# Patient Record
Sex: Female | Born: 1955 | Race: White | Hispanic: No | Smoking: Former smoker
Health system: Southern US, Community
[De-identification: ages and names within clinical notes are randomized; demographics above are authoritative.]

## PROBLEM LIST (undated history)

## (undated) DIAGNOSIS — I82419 Acute embolism and thrombosis of unspecified femoral vein: Secondary | ICD-10-CM

## (undated) DIAGNOSIS — I639 Cerebral infarction, unspecified: Secondary | ICD-10-CM

## (undated) DIAGNOSIS — C801 Malignant (primary) neoplasm, unspecified: Secondary | ICD-10-CM

## (undated) DIAGNOSIS — E785 Hyperlipidemia, unspecified: Secondary | ICD-10-CM

## (undated) DIAGNOSIS — R202 Paresthesia of skin: Secondary | ICD-10-CM

## (undated) DIAGNOSIS — E119 Type 2 diabetes mellitus without complications: Secondary | ICD-10-CM

## (undated) DIAGNOSIS — R7303 Prediabetes: Secondary | ICD-10-CM

## (undated) HISTORY — DX: Hyperlipidemia, unspecified: E78.5

## (undated) HISTORY — PX: BLADDER SURGERY: SHX569

## (undated) HISTORY — PX: ABDOMINAL HYSTERECTOMY: SHX81

## (undated) HISTORY — DX: Prediabetes: R73.03

## (undated) HISTORY — DX: Malignant (primary) neoplasm, unspecified: C80.1

## (undated) HISTORY — PX: DILATION AND EVACUATION: SHX1459

## (undated) HISTORY — DX: Cerebral infarction, unspecified: I63.9

## (undated) HISTORY — DX: Paresthesia of skin: R20.2

---

## 2010-09-27 ENCOUNTER — Ambulatory Visit: Payer: Self-pay | Admitting: Urology

## 2010-10-04 ENCOUNTER — Ambulatory Visit: Payer: Self-pay | Admitting: Urology

## 2010-10-06 LAB — PATHOLOGY REPORT

## 2013-07-29 ENCOUNTER — Ambulatory Visit (INDEPENDENT_AMBULATORY_CARE_PROVIDER_SITE_OTHER): Payer: BC Managed Care – PPO | Admitting: Psychology

## 2013-07-29 DIAGNOSIS — F909 Attention-deficit hyperactivity disorder, unspecified type: Secondary | ICD-10-CM

## 2013-07-29 DIAGNOSIS — F908 Attention-deficit hyperactivity disorder, other type: Secondary | ICD-10-CM

## 2013-08-17 ENCOUNTER — Encounter (HOSPITAL_COMMUNITY): Payer: Self-pay | Admitting: Psychology

## 2013-08-17 ENCOUNTER — Ambulatory Visit (INDEPENDENT_AMBULATORY_CARE_PROVIDER_SITE_OTHER): Payer: BC Managed Care – PPO | Admitting: Psychology

## 2013-08-17 DIAGNOSIS — F909 Attention-deficit hyperactivity disorder, unspecified type: Secondary | ICD-10-CM

## 2013-08-17 DIAGNOSIS — F908 Attention-deficit hyperactivity disorder, other type: Secondary | ICD-10-CM

## 2013-08-17 NOTE — Progress Notes (Signed)
The patient was administered the Comprehensive Attention Battery and the CAB CPT measures. The patient appeared to fully participate in these testing procedures and this does appear to be a fair and valid sample of her current attentional abilities as well as various aspects of executive functioning. Below are the results of this broad and comprehensive assessment of attention/concentration and executive functioning.  Initially, the patient was administered the auditory/visual reaction time test. These two measures are both pure reaction time measures and are administered in both the visual and auditory modalities. On the visual pure reaction time test, the patient accurately responded to 50 of the 50 targets, which is within normal limits. her average response time was within normal limits. The patient was administered the auditory pure reaction time test and she correctly responded to 49 of 50 targets, which is an efficient performance and within normal limits. her average response time was within normal limits.  The patient was then administered the discriminant reaction time test. she was administered the visual, auditory, and mixed subtests. On the visual discriminate reaction time measure, she correctly responded to 35 of 35 targets and had 0 errors of commission and 0 errors of omission. This is an efficient performance and represents a performance that is well within normative expectations. her average response time for correctly responded to items was 617 ms which is within normal limits. The patient was then administered the auditory discriminate reaction time measure. she correctly responded to 35 of 35 targets, which is efficient and within normal limits. her average response time was 701 ms, which is within normative expectations. The patient was then administered the mixed discriminate reaction time, which require shifting from between either auditory or visual targets with an alteration between  auditory and visual stimuli. This measure require shifting attention on top of discriminate identification and responding.  The patient correctly responded to 28 of the 30 targets and had one errors of commission and 2 errors of omission. This is an efficient score for accuracy.  her average response time for correct responses was 806 ms.  This performance is also within  normal limits and represents good ability to maintain set and shift between cognitive stimuli .  The patient was administered the auditory/visual scan reaction time test. On the visual measure the patient correctly responded to  to 38 of 40 targets and the average response time was within normal limits. The auditory measure resulted in the correct response to  37 of 40 targets with1  errors of commission and 2 error of omission. her average response times  were within normal limits. The patient was then administered the mixed auditory visual scan measure and she correctly responded to  to 39 of 40 targets, which is  within normal limits and her response times were  within normal limits.  The patient was then administered the auditory/visual encoding test. On the auditory forwards the patient's performance was within normal limits.  On the auditory backwards measures the patient's performance was within normal limits.  This pattern suggests  efficient performance with regard to auditory encoding. On the visual encoding forward measure the patient produced performance that was above normal limits.  On the visual backwards measures the patient's performance was above normal limits.  Overall, this pattern suggests that auditory encoding is within  normal limits and visual encoding is  in particular strength for her and equal to or above normative expectations.  The patient was then administered the Stroop interference cancellation test. This task is  broken down into eight separate trials. On the first four trials the patient is presented with a  focus execute task that requires the patient to scan a 36 grid layout in which the words red green or blue were randomly printed in each grid. Each of these color words and be printed in either red green or blue color. On half of them, the word matches the color of the font and it is these that the patient is to identify where the color and word match. After the first four trials of this visual scanning measure change to four trials that include a Stroop interference component inwhich the words red green and blue are played randomly over the speakers. On the first four "noninterference" trials the patient produced performances on these focus execute task that were  within normal limits. she correctly identified between  9 and 12 items on each of these trials. On the next four interference trials, the patient's performance showed  some difficulties with target distractors. The patient  clearly had significant interference and displayed difficulty handling the Stroop interference measures. her performance under these interference measures suggest  increased distractibility and difficulty in situations that may be noisy in nature or have other visual or auditory/.  The patient was then administered the CAB CPT visual monitor measure, which is a 15 minute long visual continuous performance measure.  This measure is broken down into five 3-minute blocks of time for analysis. The patient is presented with either the color red green or blue every 2 seconds and every time the color red is presented the patient is to respond. On the first 3 min. Block of time the patient correctly identified  30  of 30 targets with 0  error of commission and  0  errors of omission. her average response time was  540  ms. This performance  clearly deteriorated  over the next four blocks of time.  Average response time increase and consistently and progressively worsened. By the last 3 min. of this measure average response time was  724    ms, which is  a significant  increase over the very first 3 min. of this task. The results of this continues performance measure are  are clearly consistent  consistent with any deficits with regard to sustained attention and concentration.  Overall:  The patient's performance on this broad range of attention/concentration measures and executive functioning measures are  generally  consistent with those typically found with attention deficit disorder and not typical of individuals dealing with others psychological/psychiatric issues such as depression or anxiety. While it is possible that chronic sleep deprivation may be playing a role, it does appear that the patient's reported difficulties are persistent and consistent both on days where she has slept well before versus days where she has had poor sleep. While the patient may be doing even worse on days where she has poor sleep these patterns of attentional difficulties are consistent even on days where she has slept well. The two most significant variables that point attention deficit disorder versus other neurocognitive deficits are the fact that the patient does quite well on simple focus execute task, very good on general encoding task both auditory and visually and good ability to shift between targets and cognitive stimuli. However, the patient showed great difficulty with external distraction when it was targeted in nature (Stroop Effect) as well as showing a significant and precipitous decline in cognitive function when placed in a sustained attention challenge.  I do think the results of these formal and objective neuropsychological measures suggest the diagnosis of adult residual attention deficit disorder and the patient's pattern of strengths and weaknesses on neuropsychological measures suggest that she very well may be a good responder to typical psychostimulant medications. As far as specific recommendations from the range of psychostimulant  available, I would specifically and directly questioned the patient about variations during the day and how difficult it is in the morning versus the afternoon. If most of her difficulties are in the afternoon, I would suggest considering a medication like Concerta which, gives more of the psychostimulant four hours after taking. If these difficulties are consistent throughout the day, I would consider either a sustained-release Adderall or Metadate. This determination should be made by her physician as other medical issues may need to be taken into account relative to the use of psychostimulant medications.

## 2013-08-17 NOTE — Progress Notes (Signed)
Patient:   Natalie Frey   DOB:   1956/03/17  MR Number:  [id number]  Location:  [facility] [address] [place] [address] Dept: [phone]           Date of Service:   07/29/2013  Start Time:   11 AM End Time:   12 PM  Provider/Observer:  Hershal Coria PSYD       Billing Code/Service: [phone]  Chief Complaint:     Chief Complaint  Patient presents with  . ADD  . Other    Poor concentration work issues and loss of interest/energy.    Reason for Service:  The patient was referred because of concerns about possible attention deficit disorder. The patient reports that she has a job that requires a great deal of attention and that she is responsible for keeping up with a role and timecards and is noting difficulty keeping up with these intense cognitive challenges. The patient reports that she is having problems staying focused and attending to these issues on her job. 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 There are no issues associated with hyperactivity. The patient reports that her boss has noticed all these symptoms and suggested that she come in for an evaluation to assess the possibility of attention deficit disorder. The patient verbally denies any anxiety or depressive symptoms and has no history of substance abuse. However, she does acknowledge a loss of interest and memory issues as well as low energy. The patient reports that Sunday has does and sleeps between 6 and 8 hours and feels quite rested. The patient reports that on other days and she wakes up tired after being awake throughout the night worrying. The patient reports that the specific worry that she primarily has has to do with job performance.  Current Status:  The patient describes significant  attention concentration difficulties are directly affect her job. However, she also acknowledges sleep disturbance with his job related to worry about the job. She reports and issues her whole life with attention concentration and is only with starting this job she has really noticed these difficulties or had problems because of them.  Reliability of Information: The information provided specifically by the patient herself as well as review of available medical records.  Behavioral Observation: Natalie Frey  presents as a 57 y.o.-year-old Right Caucasian Female who appeared her stated age. her dress was Appropriate and she was Well Groomed and her manners were Appropriate to the situation.  There were not any physical disabilities noted.  she displayed an appropriate level of cooperation and motivation.    Interactions:    Active   Attention:   within normal limits  Memory:   within normal limits  Visuo-spatial:   within normal limits  Speech (Volume):  normal  Speech:   normal pitch  Thought Process:  Coherent  Though Content:  WNL  Orientation:   person, place, time/date and situation  Judgment:   Good  Planning:   Good  Affect:    The patient's affect appeared appropriate to the situation.  Mood:    The patient's mood did not suggest any significant issues related to depression or anxiety.  Insight:   Good  Intelligence:   normal  Marital Status/Living: The patient was born and raised in  [place]. She has a 14 year old sister, a 75 year old sister, and a 66 year old brother. The patient has a 53 year old daughter and a 41 year-old daughter.  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 She reports that she enjoys his job that her attentional difficulties are making this a hard placement for her.  Past Employment:  The patient worked for many years in [employer].  Substance Use:  No concerns of substance abuse are  reported.    Education:   HS Graduate  Medical History:   Past Medical History  Diagnosis Date  . Cancer         No outpatient encounter prescriptions on file as of 07/29/2013.          Sexual History:   History  Sexual Activity  . Sexual Activity: Not on file    Abuse/Trauma History: The patient denies any history of abuse or trauma.  Psychiatric History:  The patient denies any prior psychiatric history  Family Med/Psych History: History reviewed. No pertinent family history.  Risk of Suicide/Violence: virtually non-existent   Impression/DX:  The patient does describe a long period of difficulty with attention and concentration at her new job. She reports this is a unique situation and putting her in demands that she has not been in prior to this. The [employer] has been a very difficult job to process that may be unusual in nature but also the differentiation of issues such as adult residual attention deficit disorder versus chronic sleep deprivation.  Disposition/Plan:  We will set the patient up for formal neuropsychological testing utilizing the comprehensive attention battery.  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

## 2014-08-10 ENCOUNTER — Emergency Department: Payer: Self-pay | Admitting: Emergency Medicine

## 2017-12-23 ENCOUNTER — Other Ambulatory Visit: Payer: Self-pay

## 2017-12-23 ENCOUNTER — Observation Stay
Admission: EM | Admit: 2017-12-23 | Discharge: 2017-12-24 | Disposition: A | Discharge status: Home / Self Care | Payer: Self-pay | Attending: Internal Medicine | Admitting: Internal Medicine

## 2017-12-23 ENCOUNTER — Emergency Department: Payer: Self-pay

## 2017-12-23 DIAGNOSIS — I82412 Acute embolism and thrombosis of left femoral vein: Principal | ICD-10-CM | POA: Insufficient documentation

## 2017-12-23 DIAGNOSIS — F17210 Nicotine dependence, cigarettes, uncomplicated: Secondary | ICD-10-CM | POA: Insufficient documentation

## 2017-12-23 DIAGNOSIS — I824Z2 Acute embolism and thrombosis of unspecified deep veins of left distal lower extremity: Secondary | ICD-10-CM | POA: Insufficient documentation

## 2017-12-23 DIAGNOSIS — I82409 Acute embolism and thrombosis of unspecified deep veins of unspecified lower extremity: Secondary | ICD-10-CM | POA: Diagnosis present

## 2017-12-23 DIAGNOSIS — Z8551 Personal history of malignant neoplasm of bladder: Secondary | ICD-10-CM | POA: Insufficient documentation

## 2017-12-23 DIAGNOSIS — I82432 Acute embolism and thrombosis of left popliteal vein: Secondary | ICD-10-CM | POA: Insufficient documentation

## 2017-12-23 LAB — CBC
HCT: 43.6 % (ref 35.0–47.0)
Hemoglobin: 14.8 g/dL (ref 12.0–16.0)
MCH: 31.6 pg (ref 26.0–34.0)
MCHC: 34 g/dL (ref 32.0–36.0)
MCV: 93 fL (ref 80.0–100.0)
Platelets: 191 10*3/uL (ref 150–440)
RBC: 4.68 MIL/uL (ref 3.80–5.20)
RDW: 13.8 % (ref 11.5–14.5)
WBC: 10.2 10*3/uL (ref 3.6–11.0)

## 2017-12-23 LAB — COMPREHENSIVE METABOLIC PANEL
ALT: 14 U/L (ref 14–54)
AST: 18 U/L (ref 15–41)
Albumin: 3.9 g/dL (ref 3.5–5.0)
Alkaline Phosphatase: 69 U/L (ref 38–126)
Anion gap: 11 (ref 5–15)
BUN: 11 mg/dL (ref 6–20)
CO2: 20 mmol/L — ABNORMAL LOW (ref 22–32)
Calcium: 8.7 mg/dL — ABNORMAL LOW (ref 8.9–10.3)
Chloride: 109 mmol/L (ref 101–111)
Creatinine, Ser: 0.67 mg/dL (ref 0.44–1.00)
GFR calc Af Amer: >60 mL/min (ref >60)
GFR calc non Af Amer: >60 mL/min (ref >60)
Glucose, Bld: 134 mg/dL — ABNORMAL HIGH (ref 65–99)
Potassium: 3.7 mmol/L (ref 3.5–5.1)
Sodium: 140 mmol/L (ref 135–145)
Total Bilirubin: 0.7 mg/dL (ref 0.3–1.2)
Total Protein: 7.6 g/dL (ref 6.5–8.1)

## 2017-12-23 LAB — PROTIME-INR
INR: 0.91
Prothrombin Time: 12.2 seconds (ref 11.4–15.2)

## 2017-12-23 LAB — APTT: aPTT: 26 seconds (ref 24–36)

## 2017-12-23 MED ORDER — ACETAMINOPHEN 650 MG RE SUPP: 650.0000 mg | Freq: Four times a day (QID) | RECTAL | Status: DC | PRN

## 2017-12-23 MED ORDER — ONDANSETRON HCL 4 MG/2ML IJ SOLN: 4.0000 mg | Freq: Four times a day (QID) | INTRAMUSCULAR | Status: DC | PRN

## 2017-12-23 MED ORDER — HEPARIN (PORCINE) IN NACL 100-0.45 UNIT/ML-% IJ SOLN
1350.0000 [IU]/h | INTRAMUSCULAR | Status: DC
  Administered 2017-12-23: 1200 [IU]/h via INTRAVENOUS
  Filled 2017-12-23: qty 250

## 2017-12-23 MED ORDER — HEPARIN BOLUS VIA INFUSION
4400.0000 [IU] | Freq: Once | INTRAVENOUS | Status: AC
  Administered 2017-12-23: 4400 [IU] via INTRAVENOUS
  Filled 2017-12-23: qty 4400

## 2017-12-23 MED ORDER — ONDANSETRON HCL 4 MG PO TABS: 4.0000 mg | ORAL_TABLET | Freq: Four times a day (QID) | ORAL | Status: DC | PRN

## 2017-12-23 MED ORDER — ACETAMINOPHEN 325 MG PO TABS: 650.0000 mg | ORAL_TABLET | Freq: Four times a day (QID) | ORAL | Status: DC | PRN

## 2017-12-23 MED ORDER — IBUPROFEN 400 MG PO TABS
600.0000 mg | ORAL_TABLET | Freq: Four times a day (QID) | ORAL | Status: DC | PRN
  Administered 2017-12-24: 600 mg via ORAL
  Filled 2017-12-23: qty 2

## 2017-12-23 NOTE — Progress Notes (Addendum)
ANTICOAGULATION CONSULT NOTE - Initial Consult  Pharmacy Consult for Heparin  Indication: DVT  No Known Allergies  Patient Measurements: Height: 5\' 4"  (162.6 cm) Weight: 185 lb (83.9 kg) IBW/kg (Calculated) : 54.7 Heparin Dosing Weight: 73 kg   Vital Signs: Temp: 98.8 F (37.1 C) (03/25 1926) Temp Source: Oral (03/25 1926) BP: 149/61 (03/25 1926) Pulse Rate: 94 (03/25 1926)  Labs: No results for input(s): HGB, HCT, PLT, APTT, LABPROT, INR, HEPARINUNFRC, HEPRLOWMOCWT, CREATININE, CKTOTAL, CKMB, TROPONINI in the last 72 hours.  CrCl cannot be calculated (No order found.).   Medical History: Past Medical History:  Diagnosis Date  . Cancer [facility])     Medications:   (Not in a hospital admission)  Assessment: Pharmacy consulted to dose heparin in this 62 year old female with DVT.    No prior anticoag noted.   Goal of Therapy:  Heparin level 0.3-0.7 units/ml Monitor platelets by anticoagulation protocol: Yes   Plan:  Give 4400 units bolus x 1 Start heparin infusion at 1200 units/hr Check anti-Xa level in 6 hours and daily while on heparin Continue to monitor H&H and platelets  Natalie Frey D 12/23/2017,9:31 PM

## 2017-12-23 NOTE — ED Triage Notes (Signed)
Pt arrives to ED via POV from home with c/o LEFT leg pain and swelling x3-4 days. Pt denies h/x of DVTs or CHF; no recent long distance travel. Pt does reports frying pan fell of counter and hit her left shin on Thursday, but denies any recent significant injury or trauma.

## 2017-12-23 NOTE — ED Provider Notes (Signed)
[facility] Emergency Department Provider Note  Time seen: 9:08 PM  I have reviewed the triage vital signs and the nursing notes.   HISTORY  Chief Complaint Leg Pain and Leg Swelling    HPI Danaly Bari is a 62 y.o. female presents to the emergency department for left leg pain and swelling.  According to the patient over the past 3-4 days she has had progressively increased left leg pain swelling and redness.  Denies any fever, chest pain or shortness of breath.  Denies any long travel, no history of DVT, no recent immobility.  Largely negative review of systems otherwise.  Describes the pain and as very mild when lying in bed but moderate to severe when attempting to bear weight or ambulate.   Past Medical History:  Diagnosis Date  . Cancer ([place])     There are no active problems to display for this patient.   History reviewed. No pertinent surgical history.  Prior to Admission medications   Not on File    No Known Allergies  No family history on file.  Social History Social History   Tobacco Use  . Smoking status: Current Every Day Smoker    Packs/day: 1.00    Types: Cigarettes  . Smokeless tobacco: Never Used  Substance Use Topics  . Alcohol use: No  . Drug use: Not on file    Review of Systems Constitutional: Negative for fever. Eyes: Negative for visual complaints ENT: Negative for recent illness/congestion Cardiovascular: Negative for chest pain. Respiratory: Negative for shortness of breath. Gastrointestinal: Negative for abdominal pain Genitourinary: Negative for urinary compaints Musculoskeletal: Left leg pain swelling and redness. Skin: Mild redness of the left leg. Neurological: Negative for headache All other ROS negative  ____________________________________________   PHYSICAL EXAM:  VITAL SIGNS: ED Triage Vitals  Enc Vitals Group     BP 12/23/17 1926 (!) 149/61     Pulse Rate 12/23/17 1926 94     Resp 12/23/17  1926 18     Temp 12/23/17 1926 98.8 F (37.1 C)     Temp Source 12/23/17 1926 Oral     SpO2 12/23/17 1926 98 %     Weight 12/23/17 1922 185 lb (83.9 kg)     Height 12/23/17 1922 5\' 4"  (1.626 m)     Head Circumference --      Peak Flow --      Pain Score 12/23/17 1922 9     Pain Loc --      Pain Edu? --      Excl. in [place]? --    Constitutional: Alert and oriented. Well appearing and in no distress. Eyes: Normal exam ENT   Head: Normocephalic and atraumatic.   Mouth/Throat: Mucous membranes are moist. Cardiovascular: Normal rate, regular rhythm. No murmur Respiratory: Normal respiratory effort without tachypnea nor retractions. Breath sounds are clear  Gastrointestinal: Soft and nontender. No distention.   Musculoskeletal: Patient has moderate edema of the left leg, mild calf tenderness, 1+ DP pulse on my exam, sensation intact, warm extremity.  Normal right lower extremity, also with 1+ DP pulse. Neurologic:  Normal speech and language. No gross focal neurologic deficits Skin:  Skin is warm, dry and intact.  Mild erythema of left lower externally. Psychiatric: Mood and affect are normal.  ____________________________________________   INITIAL IMPRESSION / ASSESSMENT AND PLAN / ED COURSE  Pertinent labs & imaging results that were available during my care of the patient were reviewed by me and considered in my  medical decision making (see chart for details).  Patient presents to the emergency department for left leg pain and swelling over the past 3-4 days.  Differential would include DVT, cellulitis, Baker's cyst.  Ultrasound shows significant clot burden.  Radiologist called me stating essentially the entire left leg venous system has clotted.  Given how extensive the clot is we will admit to the hospitalist service, start the patient on a heparin infusion.  I discussed his plan of care with the patient and she is agreeable.  Reassuringly patient has no chest pain, no pleuritic  pain, no shortness of breath.   CRITICAL CARE Performed by: Harvest Dark   Total critical care time: 30 minutes  Critical care time was exclusive of separately billable procedures and treating other patients.  Critical care was necessary to treat or prevent imminent or life-threatening deterioration.  Critical care was time spent personally by me on the following activities: development of treatment plan with patient and/or surrogate as well as nursing, discussions with consultants, evaluation of patient's response to treatment, examination of patient, obtaining history from patient or surrogate, ordering and performing treatments and interventions, ordering and review of laboratory studies, ordering and review of radiographic studies, pulse oximetry and re-evaluation of patient's condition.  ____________________________________________   FINAL CLINICAL IMPRESSION(S) / ED DIAGNOSES  Extensive DVT of left lower extremity    Harvest Dark, MD 12/23/17 2112

## 2017-12-23 NOTE — ED Notes (Signed)
Swelling stating 3 days, with pain and swelling increasing over several days. LLE is red, and warm to touch

## 2017-12-23 NOTE — H&P (Signed)
[facility] [place] NAME: Natalie Frey    MR#:  [id number]  DATE OF BIRTH:  [birth year]  DATE OF ADMISSION:  12/23/2017  PRIMARY CARE PHYSICIAN: Patient, No Pcp Per   REQUESTING/REFERRING PHYSICIAN: Paduchowski, MD  CHIEF COMPLAINT:   Chief Complaint  Patient presents with  . Leg Pain  . Leg Swelling    HISTORY OF PRESENT ILLNESS:  Natalie Frey  is a 62 y.o. female who presents with 2-3 days of lower extremity pain and swelling.  Here in the ED she is found to have extensive DVT.  She has no prior history of DVT.  She denies any recent travel or other risk factors other than the fact that she is an everyday smoker.  Given that her DVT is so extensive hospitalist were called for admission for initiation of anticoagulation and further evaluation.  PAST MEDICAL HISTORY:   Past Medical History:  Diagnosis Date  . Cancer [facility])      PAST SURGICAL HISTORY:   Past Surgical History:  Procedure Laterality Date  . BLADDER SURGERY    . DILATION AND EVACUATION       SOCIAL HISTORY:   Social History   Tobacco Use  . Smoking status: Current Every Day Smoker    Packs/day: 1.00    Types: Cigarettes  . Smokeless tobacco: Never Used  Substance Use Topics  . Alcohol use: No     FAMILY HISTORY:   Family History  Problem Relation Age of Onset  . Deep vein thrombosis Daughter      DRUG ALLERGIES:  No Known Allergies  MEDICATIONS AT HOME:   Prior to Admission medications   Not on File    REVIEW OF SYSTEMS:  Review of Systems  Constitutional: Negative for chills, fever, malaise/fatigue and weight loss.  HENT: Negative for ear pain, hearing loss and tinnitus.   Eyes: Negative for blurred vision, double vision, pain and redness.  Respiratory: Negative for cough, hemoptysis and shortness of breath.   Cardiovascular: Negative for chest pain, palpitations, orthopnea and leg swelling.  Gastrointestinal: Negative for abdominal  pain, constipation, diarrhea, nausea and vomiting.  Genitourinary: Negative for dysuria, frequency and hematuria.  Musculoskeletal: Negative for back pain, joint pain and neck pain.       Lower extremity pain and swelling  Skin:       No acne, rash, or lesions  Neurological: Negative for dizziness, tremors, focal weakness and weakness.  Endo/Heme/Allergies: Negative for polydipsia. Does not bruise/bleed easily.  Psychiatric/Behavioral: Negative for depression. The patient is not nervous/anxious and does not have insomnia.      VITAL SIGNS:   Vitals:   12/23/17 1922 12/23/17 1926  BP:  (!) 149/61  Pulse:  94  Resp:  18  Temp:  98.8 F (37.1 C)  TempSrc:  Oral  SpO2:  98%  Weight: 83.9 kg (185 lb)   Height: 5\' 4"  (1.626 m)    Wt Readings from Last 3 Encounters:  12/23/17 83.9 kg (185 lb)    PHYSICAL EXAMINATION:  Physical Exam  Vitals reviewed. Constitutional: She is oriented to person, place, and time. She appears well-developed and well-nourished. No distress.  HENT:  Head: Normocephalic and atraumatic.  Mouth/Throat: Oropharynx is clear and moist.  Eyes: Pupils are equal, round, and reactive to light. Conjunctivae and EOM are normal. No scleral icterus.  Neck: Normal range of motion. Neck supple. No JVD present. No thyromegaly present.  Cardiovascular: Normal rate, regular rhythm and intact  distal pulses. Exam reveals no gallop and no friction rub.  No murmur heard. Respiratory: Effort normal and breath sounds normal. No respiratory distress. She has no wheezes. She has no rales.  GI: Soft. Bowel sounds are normal. She exhibits no distension. There is no tenderness.  Musculoskeletal: Normal range of motion. She exhibits no edema.  Left lower extremity swelling and pain, no arthritis, no gout  Lymphadenopathy:    She has no cervical adenopathy.  Neurological: She is alert and oriented to person, place, and time. No cranial nerve deficit.  No dysarthria, no aphasia   Skin: Skin is warm and dry. No rash noted. No erythema.  Psychiatric: She has a normal mood and affect. Her behavior is normal. Judgment and thought content normal.    LABORATORY PANEL:   CBC No results for input(s): WBC, HGB, HCT, PLT in the last 168 hours. ------------------------------------------------------------------------------------------------------------------  Chemistries  No results for input(s): NA, K, CL, CO2, GLUCOSE, BUN, CREATININE, CALCIUM, MG, AST, ALT, ALKPHOS, BILITOT in the last 168 hours.  Invalid input(s): GFRCGP ------------------------------------------------------------------------------------------------------------------  Cardiac Enzymes No results for input(s): TROPONINI in the last 168 hours. ------------------------------------------------------------------------------------------------------------------  RADIOLOGY:  US Venous Img Lower Unilateral Left  Result Date: 12/23/2017 CLINICAL DATA:  Pain, swelling left calf. EXAM: LEFT LOWER EXTREMITY VENOUS DOPPLER ULTRASOUND TECHNIQUE: Gray-scale sonography with graded compression, as well as color Doppler and duplex ultrasound were performed to evaluate the lower extremity deep venous systems from the level of the common femoral vein and including the common femoral, femoral, profunda femoral, popliteal and calf veins including the posterior tibial, peroneal and gastrocnemius veins when visible. The superficial great saphenous vein was also interrogated. Spectral Doppler was utilized to evaluate flow at rest and with distal augmentation maneuvers in the common femoral, femoral and popliteal veins. COMPARISON:  None. FINDINGS: Contralateral Common Femoral Vein: Respiratory phasicity is normal and symmetric with the symptomatic side. No evidence of thrombus. Normal compressibility. Common Femoral Vein: No evidence of thrombus. Normal compressibility, respiratory phasicity and response to augmentation.  Saphenofemoral Junction: No evidence of thrombus. Normal compressibility and flow on color Doppler imaging. Profunda Femoral Vein: No evidence of thrombus. Normal compressibility and flow on color Doppler imaging. Femoral Vein: Occlusive thrombus noted throughout the entire femoral vein. Vessel is noncompressible. Popliteal Vein: Occlusive thrombus noted throughout the popliteal vein. Vessel is not compressible. Calf Veins: Occlusive thrombus noted throughout the calf veins. Vessels are noncompressible. Superficial Great Saphenous Vein: No evidence of thrombus. Normal compressibility. Venous Reflux:  None. Other Findings:  None. IMPRESSION: Occlusive thrombus throughout the left femoral vein, popliteal vein and calf veins. These results were called by telephone at the time of interpretation on 12/23/2017 at 8:25 pm to Dr. Hinda Kehr , who verbally acknowledged these results. Electronically Signed   By: Rolm Baptise M.D.   On: 12/23/2017 20:27    EKG:  No orders found for this or any previous visit.  IMPRESSION AND PLAN:  Principal Problem:   DVT (deep venous thrombosis) (HCC) -IV heparin for anticoagulation tonight.  Patient can likely be switched to oral anticoagulation tomorrow.  Chart review performed and case discussed with ED provider. Labs, imaging and/or ECG reviewed by provider and discussed with patient/family. Management plans discussed with the patient and/or family.  DVT PROPHYLAXIS: Systemic anticoagulation  GI PROPHYLAXIS: None  ADMISSION STATUS: Observation  CODE STATUS: Full  TOTAL TIME TAKING CARE OF THIS PATIENT: 45 minutes.   Fredricka Kohrs [place] 12/23/2017, 9:48 PM  [employer]  [phone]  CC: Primary care physician; Patient, No Pcp Per  Note:  This document was prepared using Dragon voice recognition software and may include unintentional dictation errors.

## 2017-12-24 ENCOUNTER — Other Ambulatory Visit: Payer: Self-pay

## 2017-12-24 LAB — CBC
HCT: 40.4 % (ref 35.0–47.0)
Hemoglobin: 13.6 g/dL (ref 12.0–16.0)
MCH: 31.6 pg (ref 26.0–34.0)
MCHC: 33.7 g/dL (ref 32.0–36.0)
MCV: 93.8 fL (ref 80.0–100.0)
Platelets: 186 10*3/uL (ref 150–440)
RBC: 4.31 MIL/uL (ref 3.80–5.20)
RDW: 13.8 % (ref 11.5–14.5)
WBC: 10.3 10*3/uL (ref 3.6–11.0)

## 2017-12-24 LAB — HEPARIN LEVEL (UNFRACTIONATED): Heparin Unfractionated: 0.28 IU/mL — ABNORMAL LOW (ref 0.30–0.70)

## 2017-12-24 LAB — BASIC METABOLIC PANEL
Anion gap: 10 (ref 5–15)
BUN: 12 mg/dL (ref 6–20)
CO2: 20 mmol/L — ABNORMAL LOW (ref 22–32)
Calcium: 8.5 mg/dL — ABNORMAL LOW (ref 8.9–10.3)
Chloride: 110 mmol/L (ref 101–111)
Creatinine, Ser: 0.65 mg/dL (ref 0.44–1.00)
GFR calc Af Amer: >60 mL/min (ref >60)
GFR calc non Af Amer: >60 mL/min (ref >60)
Glucose, Bld: 129 mg/dL — ABNORMAL HIGH (ref 65–99)
Potassium: 4.1 mmol/L (ref 3.5–5.1)
Sodium: 140 mmol/L (ref 135–145)

## 2017-12-24 MED ORDER — NICOTINE 21 MG/24HR TD PT24: 21.0000 mg | MEDICATED_PATCH | Freq: Every day | TRANSDERMAL | Status: DC

## 2017-12-24 MED ORDER — TRAMADOL HCL 50 MG PO TABS
50.0000 mg | ORAL_TABLET | Freq: Four times a day (QID) | ORAL | Status: DC | PRN
  Filled 2017-12-24: qty 1

## 2017-12-24 MED ORDER — IBUPROFEN 400 MG PO TABS: 600.0000 mg | ORAL_TABLET | Freq: Four times a day (QID) | ORAL | Status: DC | PRN

## 2017-12-24 MED ORDER — APIXABAN 5 MG PO TABS: 10.0000 mg | ORAL_TABLET | Freq: Two times a day (BID) | ORAL | Status: DC

## 2017-12-24 MED ORDER — HEPARIN BOLUS VIA INFUSION
1100.0000 [IU] | Freq: Once | INTRAVENOUS | Status: AC
  Administered 2017-12-24: 1100 [IU] via INTRAVENOUS
  Filled 2017-12-24: qty 1100

## 2017-12-24 MED ORDER — APIXABAN 5 MG PO TABS
10.0000 mg | ORAL_TABLET | Freq: Two times a day (BID) | ORAL | Status: DC
  Administered 2017-12-24: 10 mg via ORAL
  Filled 2017-12-24: qty 2

## 2017-12-24 MED ORDER — NICOTINE 21 MG/24HR TD PT24: 21.0000 mg | MEDICATED_PATCH | Freq: Every day | TRANSDERMAL | 0 refills | Status: DC

## 2017-12-24 MED ORDER — ACETAMINOPHEN 325 MG PO TABS: 650.0000 mg | ORAL_TABLET | Freq: Four times a day (QID) | ORAL | Status: AC | PRN

## 2017-12-24 MED ORDER — APIXABAN 5 MG PO TABS: 5.0000 mg | ORAL_TABLET | Freq: Two times a day (BID) | ORAL | Status: DC

## 2017-12-24 MED ORDER — TRAMADOL HCL 50 MG PO TABS: 50.0000 mg | ORAL_TABLET | Freq: Four times a day (QID) | ORAL | 0 refills | Status: DC | PRN

## 2017-12-24 NOTE — Care Management Note (Signed)
Case Management Note  Patient Details  Name: Natalie Frey MRN: [id number] Date of Birth: 1955-12-07  Subjective/Objective:  Met with patient at bedside to discuss uninsured status. Application given for medication management and [facility]. Will fax prscriptions once available. Email sent to both agencies. Patient appreciative.   Action/Plan:   Expected Discharge Date:  12/24/17               Expected Discharge Plan:     In-House Referral:     Discharge planning Services  CM Consult, Medication Assistance, [facility]  Post Acute Care Choice:    Choice offered to:  Patient  DME Arranged:    DME Agency:     HH Arranged:    [place] Agency:     Status of Service:  Completed, signed off  If discussed at [employer], dates discussed:    Additional Comments:  Jolly Mango, RN 12/24/2017, 10:30 AM

## 2017-12-24 NOTE — Progress Notes (Signed)
Family Meeting Note  Advance Directive:yes  Today a meeting took place with the Patient, daughter at bed side    The following clinical team members were present during this meeting:MD  The following were discussed:Patient's diagnosis: Acute left lower extremity DVT, history of bladder cancer, tobacco abuse disorder, plan of care discussed in detail with the patient she verbalized understanding of the plan  patient's progosis: Unable to determine and Goals for treatment: Full Code, daughter is the healthcare power of attorney  Additional follow-up to be provided: hospitalist, primary care physician and oncology  Time spent during discussion:17 min  Nicholes Mango, MD

## 2017-12-24 NOTE — Progress Notes (Signed)
ANTICOAGULATION CONSULT NOTE - Initial Consult  Pharmacy Consult for apixaban treatment dosing of DVT Indication: DVT  No Known Allergies  Patient Measurements: Height: 5\' 4"  (162.6 cm) Weight: 185 lb (83.9 kg) IBW/kg (Calculated) : 54.7  Vital Signs: Temp: 98.6 F (37 C) (03/26 0725) Temp Source: Oral (03/26 0725) BP: 155/63 (03/25 2310) Pulse Rate: 88 (03/26 0725)  Labs: Recent Labs    12/23/17 2127 12/24/17 0412  HGB 14.8 13.6  HCT 43.6 40.4  PLT 191 186  APTT 26  --   LABPROT 12.2  --   INR 0.91  --   HEPARINUNFRC  --  0.28*  CREATININE 0.67 0.65    Estimated Creatinine Clearance: 79.4 mL/min (by C-G formula based on SCr of 0.65 mg/dL).   Medical History: Past Medical History:  Diagnosis Date  . Cancer ([place])     Medications:  No medications prior to admission.   Scheduled:  . apixaban  10 mg Oral BID   Followed by  . [START ON 12/31/2017] apixaban  5 mg Oral BID   Infusions:   PRN: acetaminophen **OR** acetaminophen, ibuprofen, ondansetron **OR** ondansetron (ZOFRAN) IV, traMADol Anti-infectives (From admission, onward)   None      Assessment: 62 year old female, stopped heparin iv infusion, starting apixaban for the treatment of a DVT per pharmacy consult.   Goal of Therapy:  anticoagulation Monitor platelets by anticoagulation protocol: Yes    Plan:  Apixaban 10mg  po bid x 7 days followed by apixaban 5mg  po bid thereafter. Following physician to determine duration of treatment. Pharmacy will monitor per policy   Donna Christen Patsy Varma 12/24/2017,10:07 AM

## 2017-12-24 NOTE — Discharge Instructions (Signed)
F/u with PCP -preferred primary care in a week F/u with hematology in a week

## 2017-12-24 NOTE — Progress Notes (Signed)
RN spoke with MD regarding pt's pain. New orders given. Will administer PO Tramadol PRN.

## 2017-12-24 NOTE — Progress Notes (Signed)
ANTICOAGULATION CONSULT NOTE - Initial Consult  Pharmacy Consult for Heparin  Indication: DVT  No Known Allergies  Patient Measurements: Height: 5\' 4"  (162.6 cm) Weight: 185 lb (83.9 kg) IBW/kg (Calculated) : 54.7 Heparin Dosing Weight: 73 kg   Vital Signs: Temp: 98.6 F (37 C) (03/25 2310) Temp Source: Oral (03/25 2310) BP: 155/63 (03/25 2310) Pulse Rate: 82 (03/25 2310)  Labs: Recent Labs    12/23/17 2127 12/24/17 0412  HGB 14.8 13.6  HCT 43.6 40.4  PLT 191 186  APTT 26  --   LABPROT 12.2  --   INR 0.91  --   HEPARINUNFRC  --  0.28*  CREATININE 0.67 0.65    Estimated Creatinine Clearance: 79.4 mL/min (by C-G formula based on SCr of 0.65 mg/dL).   Medical History: Past Medical History:  Diagnosis Date  . Cancer ([place])     Medications:  No medications prior to admission.    Assessment: Pharmacy consulted to dose heparin in this 62 year old female with DVT.    No prior anticoag noted.   Goal of Therapy:  Heparin level 0.3-0.7 units/ml Monitor platelets by anticoagulation protocol: Yes   Plan:  Give 4400 units bolus x 1 Start heparin infusion at 1200 units/hr Check anti-Xa level in 6 hours and daily while on heparin Continue to monitor H&H and platelets   03/26 0400 heparin level 0.28. 1100 unit bolus and increase rate to 1350 units/hr. Recheck in 6 hours.  An Schnabel S 12/24/2017,5:01 AM

## 2017-12-24 NOTE — Discharge Summary (Signed)
[facility] [place] NAME: Natalie Frey    MR#:  [id number]  DATE OF BIRTH:  02-Feb-1956  DATE OF ADMISSION:  12/23/2017 ADMITTING PHYSICIAN: Lance Coon, MD  DATE OF DISCHARGE:  12/24/17   PRIMARY CARE PHYSICIAN: Patient, No Pcp Per    ADMISSION DIAGNOSIS:  Acute deep vein thrombosis (DVT) of femoral vein of left lower extremity ([place]) [I82.412]  DISCHARGE DIAGNOSIS:  Principal Problem:   DVT (deep venous thrombosis) ([place])   SECONDARY DIAGNOSIS:   Past Medical History:  Diagnosis Date  . Cancer [facility])     HOSPITAL COURSE:   HPI  Natalie Frey  is a 62 y.o. female who presents with 2-3 days of lower extremity pain and swelling.  Here in the ED she is found to have extensive DVT.  She has no prior history of DVT.  She denies any recent travel or other risk factors other than the fact that she is an everyday smoker.  Given that her DVT is so extensive hospitalist were called for admission for initiation of anticoagulation and further evaluation.  # Acute left lower extremity DVT-unclear etiology Patient was given heparin bolus and drip, clinically feeling better.  We will switch her to Eliquis and discharge her home Tramadol as needed for pain Outpatient follow-up with oncology for COAG workup  #Leg pain from underlying DVT Tramadol as needed  #History of bladder cancer in the past currently not on any treatments patient is under remission  #Tobacco abuse disorder counseled patient to quit smoking for 5 minutes she verbalized understanding and she is agreeable with nicotine patch will provide 21 mg of nicotine patch on daily basis  DISCHARGE CONDITIONS:   Stable   CONSULTS OBTAINED:     PROCEDURES  None   DRUG ALLERGIES:  No Known Allergies  DISCHARGE MEDICATIONS:   Allergies as of 12/24/2017   No Known Allergies     Medication List    TAKE these medications   acetaminophen 325 MG tablet Commonly known as:   TYLENOL Take 2 tablets (650 mg total) by mouth every 6 (six) hours as needed for mild pain (or Fever >/= 101).   apixaban 5 MG Tabs tablet Commonly known as:  ELIQUIS Take 2 tablets (10 mg total) by mouth 2 (two) times daily. 10 mg by mouth twice a day for 7 days followed by 5 mg twice a day Start taking on:  12/31/2017   traMADol 50 MG tablet Commonly known as:  ULTRAM Take 1 tablet (50 mg total) by mouth every 6 (six) hours as needed for moderate pain.        DISCHARGE INSTRUCTIONS:   F/u with PCP -preferred primary care in a week F/u with hematology in a week   DIET:  Regular diet  DISCHARGE CONDITION:  Stable  ACTIVITY:  Activity as tolerated  OXYGEN:  Home Oxygen: No.   Oxygen Delivery: room air  DISCHARGE LOCATION:  home   If you experience worsening of your admission symptoms, develop shortness of breath, life threatening emergency, suicidal or homicidal thoughts you must seek medical attention immediately by calling 911 or calling your MD immediately  if symptoms less severe.  You Must read complete instructions/literature along with all the possible adverse reactions/side effects for all the Medicines you take and that have been prescribed to you. Take any new Medicines after you have completely understood and accpet all the possible adverse reactions/side effects.   Please note  You were cared  for by a hospitalist during your hospital stay. If you have any questions about your discharge medications or the care you received while you were in the hospital after you are discharged, you can call the unit and asked to speak with the hospitalist on call if the hospitalist that took care of you is not available. Once you are discharged, your primary care physician will handle any further medical issues. Please note that NO REFILLS for any discharge medications will be authorized once you are discharged, as it is imperative that you return to your primary care physician  (or establish a relationship with a primary care physician if you do not have one) for your aftercare needs so that they can reassess your need for medications and monitor your lab values.     Today  Chief Complaint  Patient presents with  . Leg Pain  . Leg Swelling   Patient denies any leg pain or chest pain or shortness of breath.  Wants to go home.  Denies any sedentary life  Lately.  ROS:  CONSTITUTIONAL: Denies fevers, chills. Denies any fatigue, weakness.  EYES: Denies blurry vision, double vision, eye pain. EARS, NOSE, THROAT: Denies tinnitus, ear pain, hearing loss. RESPIRATORY: Denies cough, wheeze, shortness of breath.  CARDIOVASCULAR: Denies chest pain, palpitations, edema.  GASTROINTESTINAL: Denies nausea, vomiting, diarrhea, abdominal pain. Denies bright red blood per rectum. GENITOURINARY: Denies dysuria, hematuria. ENDOCRINE: Denies nocturia or thyroid problems. HEMATOLOGIC AND LYMPHATIC: Denies easy bruising or bleeding. SKIN: Denies rash or lesion. MUSCULOSKELETAL: Denies pain in neck, back, shoulder, knees, hips or arthritic symptoms.  NEUROLOGIC: Denies paralysis, paresthesias.  PSYCHIATRIC: Denies anxiety or depressive symptoms.   VITAL SIGNS:  Blood pressure (!) 155/63, pulse 88, temperature 98.6 F (37 C), temperature source Oral, resp. rate 14, height 5\' 4"  (1.626 m), weight 83.9 kg (185 lb), SpO2 100 %.  I/O:    Intake/Output Summary (Last 24 hours) at 12/24/2017 1143 Last data filed at 12/24/2017 0900 Gross per 24 hour  Intake 296 ml  Output -  Net 296 ml    PHYSICAL EXAMINATION:  GENERAL:  62 y.o.-year-old patient lying in the bed with no acute distress.  EYES: Pupils equal, round, reactive to light and accommodation. No scleral icterus. Extraocular muscles intact.  HEENT: Head atraumatic, normocephalic. Oropharynx and nasopharynx clear.  NECK:  Supple, no jugular venous distention. No thyroid enlargement, no tenderness.  LUNGS: Normal breath  sounds bilaterally, no wheezing, rales,rhonchi or crepitation. No use of accessory muscles of respiration.  CARDIOVASCULAR: S1, S2 normal. No murmurs, rubs, or gallops.  ABDOMEN: Soft, non-tender, non-distended. Bowel sounds present. No organomegaly or mass.  EXTREMITIES: Left leg is edematous and minimal tenderness no pedal edema, cyanosis, or clubbing.  NEUROLOGIC: Cranial nerves II through XII are intact. Muscle strength 5/5 in all extremities. Sensation intact. Gait not checked.  PSYCHIATRIC: The patient is alert and oriented x 3.  SKIN: No obvious rash, lesion, or ulcer.   DATA REVIEW:   CBC Recent Labs  Lab 12/24/17 0412  WBC 10.3  HGB 13.6  HCT 40.4  PLT 186    Chemistries  Recent Labs  Lab 12/23/17 2127 12/24/17 0412  NA 140 140  K 3.7 4.1  CL 109 110  CO2 20* 20*  GLUCOSE 134* 129*  BUN 11 12  CREATININE 0.67 0.65  CALCIUM 8.7* 8.5*  AST 18  --   ALT 14  --   ALKPHOS 69  --   BILITOT 0.7  --  Cardiac Enzymes No results for input(s): TROPONINI in the last 168 hours.  Microbiology Results  No results found for this or any previous visit.  RADIOLOGY:  US Venous Img Lower Unilateral Left  Result Date: 12/23/2017 CLINICAL DATA:  Pain, swelling left calf. EXAM: LEFT LOWER EXTREMITY VENOUS DOPPLER ULTRASOUND TECHNIQUE: Gray-scale sonography with graded compression, as well as color Doppler and duplex ultrasound were performed to evaluate the lower extremity deep venous systems from the level of the common femoral vein and including the common femoral, femoral, profunda femoral, popliteal and calf veins including the posterior tibial, peroneal and gastrocnemius veins when visible. The superficial great saphenous vein was also interrogated. Spectral Doppler was utilized to evaluate flow at rest and with distal augmentation maneuvers in the common femoral, femoral and popliteal veins. COMPARISON:  None. FINDINGS: Contralateral Common Femoral Vein: Respiratory  phasicity is normal and symmetric with the symptomatic side. No evidence of thrombus. Normal compressibility. Common Femoral Vein: No evidence of thrombus. Normal compressibility, respiratory phasicity and response to augmentation. Saphenofemoral Junction: No evidence of thrombus. Normal compressibility and flow on color Doppler imaging. Profunda Femoral Vein: No evidence of thrombus. Normal compressibility and flow on color Doppler imaging. Femoral Vein: Occlusive thrombus noted throughout the entire femoral vein. Vessel is noncompressible. Popliteal Vein: Occlusive thrombus noted throughout the popliteal vein. Vessel is not compressible. Calf Veins: Occlusive thrombus noted throughout the calf veins. Vessels are noncompressible. Superficial Great Saphenous Vein: No evidence of thrombus. Normal compressibility. Venous Reflux:  None. Other Findings:  None. IMPRESSION: Occlusive thrombus throughout the left femoral vein, popliteal vein and calf veins. These results were called by telephone at the time of interpretation on 12/23/2017 at 8:25 pm to Dr. Hinda Kehr , who verbally acknowledged these results. Electronically Signed   By: Rolm Baptise M.D.   On: 12/23/2017 20:27    EKG:  No orders found for this or any previous visit.    Management plans discussed with the patient, family and they are in agreement.  CODE STATUS:     Code Status Orders  (From admission, onward)        Start     Ordered   12/23/17 2310  Full code  Continuous     12/23/17 2309    Code Status History    This patient has a current code status but no historical code status.      TOTAL TIME TAKING CARE OF THIS PATIENT: 43  minutes.   Note: This dictation was prepared with Dragon dictation along with smaller phrase technology. Any transcriptional errors that result from this process are unintentional.   @[facility] @  on 12/24/2017 at 11:43 AM  Between 7am to 6pm - Pager - [phone]  After 6pm go to [email or web address] -  password [email or web address] [facility]  Office  [phone]  CC: Primary care physician; Patient, No Pcp Per

## 2017-12-25 LAB — HIV ANTIBODY (ROUTINE TESTING W REFLEX): HIV Screen 4th Generation wRfx: NONREACTIVE

## 2017-12-30 ENCOUNTER — Inpatient Hospital Stay: Payer: MEDICAID | Admitting: Internal Medicine

## 2018-01-01 ENCOUNTER — Ambulatory Visit: Payer: Self-pay | Admitting: Pharmacy Technician

## 2018-01-16 ENCOUNTER — Encounter: Payer: Self-pay | Admitting: Adult Health

## 2018-01-16 ENCOUNTER — Ambulatory Visit: Payer: Self-pay | Admitting: Adult Health

## 2018-01-16 VITALS — BP 135/72 | HR 94 | Temp 99.5°F | Ht 64.0 in | Wt 194.0 lb

## 2018-01-16 DIAGNOSIS — I82412 Acute embolism and thrombosis of left femoral vein: Secondary | ICD-10-CM

## 2018-01-16 DIAGNOSIS — F17200 Nicotine dependence, unspecified, uncomplicated: Secondary | ICD-10-CM

## 2018-01-16 MED ORDER — APIXABAN 5 MG PO TABS: 10.0000 mg | ORAL_TABLET | Freq: Every day | ORAL | Status: DC

## 2018-01-16 NOTE — Progress Notes (Signed)
Patient: Natalie Frey Female    DOB: 1956/08/29   62 y.o.   MRN: [id number] Visit Date: 01/16/2018  Today's Provider: Mary Sella, NP   Chief Complaint  Patient presents with  . New Patient (Initial Visit)   Subjective:    HPI 62 Y/Ofemale who presents for an initial office visit and evaluation of left leg DVT. She was discharged from the hospital on march 26th after hospitalization for a LLE DVT of the femoral vein.She has a h/o bladder cancer for which she was fully treated and now in remission for 9 years. She is currently on apixaban 5 MG tabs bid. She is c/o persistent but improved left leg swelling and pain with prolonged standing or ambulation. She denies chest pain, palpitations but reports occasional shortness of breath with exertion which is at her baseline.  She is a current every day smoker  No Known Allergies Previous Medications   ACETAMINOPHEN (TYLENOL) 325 MG TABLET    Take 2 tablets (650 mg total) by mouth every 6 (six) hours as needed for mild pain (or Fever >/= 101).   APIXABAN (ELIQUIS) 5 MG TABS TABLET    Take 2 tablets (10 mg total) by mouth 2 (two) times daily. 10 mg by mouth twice a day for 7 days followed by 5 mg twice a day   NICOTINE (NICODERM CQ - DOSED IN MG/24 HOURS) 21 MG/24HR PATCH    Place 1 patch (21 mg total) onto the skin daily.   TRAMADOL (ULTRAM) 50 MG TABLET    Take 1 tablet (50 mg total) by mouth every 6 (six) hours as needed for moderate pain.    Review of Systems  Constitutional: Negative.   HENT: Negative.   Eyes: Negative.   Respiratory: Positive for shortness of breath (with exertion).   Gastrointestinal: Negative.   Endocrine: Negative.   Genitourinary: Negative.   Musculoskeletal:       Left leg swelling and pain  Skin: Negative.   Allergic/Immunologic: Negative.   Neurological: Negative.   Hematological: Negative.   Psychiatric/Behavioral: Negative.     Social History   Tobacco Use  . Smoking status: Current Every Day Smoker     Packs/day: 1.00    Types: Cigarettes  . Smokeless tobacco: Never Used  Substance Use Topics  . Alcohol use: No   Objective:   BP 135/72   Pulse 94   Temp 99.5 F (37.5 C)   Ht 5\' 4"  (1.626 m)   Wt 194 lb (88 kg)   BMI 33.30 kg/m   Physical Exam  Constitutional: She is oriented to person, place, and time. She appears well-developed and well-nourished.  Eyes: Pupils are equal, round, and reactive to light. Conjunctivae and EOM are normal.  Neck: Normal range of motion. Neck supple.  Cardiovascular: Normal rate, regular rhythm and normal heart sounds.  Diminished pulses in left lower extremity  Pulmonary/Chest: Effort normal and breath sounds normal.  Abdominal: Soft. Bowel sounds are normal.  Musculoskeletal: Normal range of motion. She exhibits edema (Left lower extremity, no calf tenderness).  Neurological: She is alert and oriented to person, place, and time.  Skin: Skin is warm and dry. Capillary refill takes more than 3 seconds. Capillary refill diminished in left lower extremity.  Psychiatric: She has a normal mood and affect.      Assessment & Plan:   1. Acute deep vein thrombosis (DVT) of femoral vein of left lower extremity (HCC) Continue Eliquis 5 mg twice a day and Tylenol as needed  for pain Compression stockings on in the morning and off at bedtime  2. Tobacco use disorder Smoking cessation advice given  Return to clinic if worsening dyspnea and worsening leg swelling Follow-up in 3 months  [place], NP   [facility]

## 2018-01-18 DIAGNOSIS — F17200 Nicotine dependence, unspecified, uncomplicated: Secondary | ICD-10-CM | POA: Insufficient documentation

## 2018-01-18 DIAGNOSIS — IMO0001 Reserved for inherently not codable concepts without codable children: Secondary | ICD-10-CM | POA: Insufficient documentation

## 2018-01-21 ENCOUNTER — Telehealth: Payer: Self-pay | Admitting: Pharmacist

## 2018-01-21 NOTE — Telephone Encounter (Signed)
01/21/2018 1:48:58 PM - Eliquis 5mg   01/21/18 Faxed [place] application for [employer] 5mg  Take 1 tablet twice a day.Delos Haring

## 2018-04-17 ENCOUNTER — Other Ambulatory Visit: Payer: Self-pay

## 2018-04-17 ENCOUNTER — Other Ambulatory Visit: Payer: Self-pay | Admitting: Adult Health Nurse Practitioner

## 2018-04-17 DIAGNOSIS — Z0000 Encounter for general adult medical examination without abnormal findings: Secondary | ICD-10-CM

## 2018-04-18 LAB — BASIC METABOLIC PANEL WITH GFR
BUN/Creatinine Ratio: 15 (ref 9–23)
BUN: 11 mg/dL (ref 6–24)
CO2: 22 mmol/L (ref 20–29)
Calcium: 9.2 mg/dL (ref 8.7–10.2)
Chloride: 105 mmol/L (ref 96–106)
Creatinine, Ser: 0.74 mg/dL (ref 0.57–1.00)
GFR calc Af Amer: 103 mL/min/1.73
GFR calc non Af Amer: 89 mL/min/1.73
Glucose: 107 mg/dL — ABNORMAL HIGH (ref 65–99)
Potassium: 4.3 mmol/L (ref 3.5–5.2)
Sodium: 142 mmol/L (ref 134–144)

## 2018-04-18 LAB — TSH: TSH: 1.84 u[IU]/mL (ref 0.450–4.500)

## 2018-04-18 LAB — CBC
Hematocrit: 43.7 % (ref 34.0–46.6)
Hemoglobin: 14.5 g/dL (ref 11.1–15.9)
MCH: 31.3 pg (ref 26.6–33.0)
MCHC: 33.2 g/dL (ref 31.5–35.7)
MCV: 94 fL (ref 79–97)
Platelets: 264 x10E3/uL (ref 150–450)
RBC: 4.64 x10E6/uL (ref 3.77–5.28)
RDW: 14.2 % (ref 12.3–15.4)
WBC: 9.2 x10E3/uL (ref 3.4–10.8)

## 2018-04-18 LAB — LIPID PANEL
Chol/HDL Ratio: 6.2 ratio — ABNORMAL HIGH (ref 0.0–4.4)
Cholesterol, Total: 223 mg/dL — ABNORMAL HIGH (ref 100–199)
HDL: 36 mg/dL — ABNORMAL LOW
LDL Calculated: 149 mg/dL — ABNORMAL HIGH (ref 0–99)
Triglycerides: 188 mg/dL — ABNORMAL HIGH (ref 0–149)
VLDL Cholesterol Cal: 38 mg/dL (ref 5–40)

## 2018-04-18 LAB — HEMOGLOBIN A1C
Est. average glucose Bld gHb Est-mCnc: 131 mg/dL
Hgb A1c MFr Bld: 6.2 % — ABNORMAL HIGH (ref 4.8–5.6)

## 2018-04-24 ENCOUNTER — Ambulatory Visit: Payer: Self-pay | Admitting: Adult Health

## 2018-04-24 ENCOUNTER — Encounter: Payer: Self-pay | Admitting: Adult Health

## 2018-04-24 VITALS — BP 143/79 | HR 79 | Ht 70.0 in | Wt 197.3 lb

## 2018-04-24 DIAGNOSIS — R7303 Prediabetes: Secondary | ICD-10-CM

## 2018-04-24 DIAGNOSIS — F17200 Nicotine dependence, unspecified, uncomplicated: Secondary | ICD-10-CM

## 2018-04-24 DIAGNOSIS — E782 Mixed hyperlipidemia: Secondary | ICD-10-CM

## 2018-04-24 DIAGNOSIS — R030 Elevated blood-pressure reading, without diagnosis of hypertension: Secondary | ICD-10-CM

## 2018-04-24 DIAGNOSIS — I82412 Acute embolism and thrombosis of left femoral vein: Secondary | ICD-10-CM

## 2018-04-24 NOTE — Progress Notes (Signed)
  Subjective:     Patient ID: Natalie Frey, female   DOB: 12-11-1955, 62 y.o.   MRN: [id number]  HPI 62 Y/O female who presents for f/u DVT. Leg swelling has improved. She is still on apixaban and reports no medication side effects. Her recent labs show an abnormal lipid panel and a HgA1C of 6.2 she denies chest pain palpitations nausea vomiting and vomiting  Review of Systems  Constitutional: Negative for fatigue and fever.  Respiratory: Negative for chest tightness and shortness of breath.   Musculoskeletal: Positive for arthralgias. Negative for gait problem and joint swelling.  Skin: Negative.   Neurological: Negative for weakness and numbness.  Hematological: Negative for adenopathy. Does not bruise/bleed easily.       Objective:   Physical Exam  Constitutional: She appears well-developed and well-nourished.  HENT:  Head: Normocephalic and atraumatic.  Cardiovascular: Normal rate, regular rhythm, normal heart sounds and intact distal pulses.  Pulmonary/Chest: Effort normal and breath sounds normal.  Abdominal: Soft. Bowel sounds are normal.  Musculoskeletal: Normal range of motion.  Left leg slightly larger then right, no calf tenderness  Skin: Skin is warm and dry.       Assessment and plan  1. Acute deep vein thrombosis (DVT) of femoral vein of left lower extremity (HCC) Resolving.  Continue Eliquis for 6 months  2. Tobacco use disorder Smoking cessation advice given  3. Mixed hyperlipidemia Low-fat diet recommended we will repeat lipid panel in 3 months  4. Prediabetes Lifestyle changes recommended we will repeat hemoglobin A1c in 3 months  5. Elevated blood pressure reading Patient advised to monitor blood pressure daily and keep a log and call or return to the clinic if blood pressure readings are consistently greater than 140/80 Advised to follow low-salt diet  Benito Lemmerman S. Tukov ANP-BC Pager [phone] or [phone]  NB: This document was prepared using  Dragon voice recognition software and may include unintentional dictation errors.

## 2018-05-12 ENCOUNTER — Encounter: Payer: Self-pay | Admitting: Adult Health

## 2018-07-29 ENCOUNTER — Other Ambulatory Visit: Payer: Self-pay

## 2018-07-29 ENCOUNTER — Telehealth: Payer: Self-pay | Admitting: Adult Health Nurse Practitioner

## 2018-07-29 DIAGNOSIS — E782 Mixed hyperlipidemia: Secondary | ICD-10-CM

## 2018-07-29 DIAGNOSIS — R7303 Prediabetes: Secondary | ICD-10-CM

## 2018-07-30 LAB — LIPID PANEL
Chol/HDL Ratio: 7.1 ratio — ABNORMAL HIGH (ref 0.0–4.4)
Cholesterol, Total: 248 mg/dL — ABNORMAL HIGH (ref 100–199)
HDL: 35 mg/dL — ABNORMAL LOW (ref >39)
LDL Calculated: 157 mg/dL — ABNORMAL HIGH (ref 0–99)
Triglycerides: 278 mg/dL — ABNORMAL HIGH (ref 0–149)
VLDL Cholesterol Cal: 56 mg/dL — ABNORMAL HIGH (ref 5–40)

## 2018-07-30 LAB — HEMOGLOBIN A1C
Est. average glucose Bld gHb Est-mCnc: 131 mg/dL
Hgb A1c MFr Bld: 6.2 % — ABNORMAL HIGH (ref 4.8–5.6)

## 2018-07-31 ENCOUNTER — Ambulatory Visit: Payer: Self-pay | Admitting: Adult Health

## 2018-07-31 ENCOUNTER — Encounter: Payer: Self-pay | Admitting: Gerontology

## 2018-07-31 ENCOUNTER — Other Ambulatory Visit: Payer: Self-pay

## 2018-07-31 ENCOUNTER — Ambulatory Visit: Payer: Self-pay | Admitting: Gerontology

## 2018-07-31 VITALS — BP 148/89 | HR 80 | Ht 64.0 in | Wt 190.9 lb

## 2018-07-31 DIAGNOSIS — R7303 Prediabetes: Secondary | ICD-10-CM

## 2018-07-31 DIAGNOSIS — Z0000 Encounter for general adult medical examination without abnormal findings: Secondary | ICD-10-CM

## 2018-07-31 DIAGNOSIS — F17200 Nicotine dependence, unspecified, uncomplicated: Secondary | ICD-10-CM

## 2018-07-31 DIAGNOSIS — R030 Elevated blood-pressure reading, without diagnosis of hypertension: Secondary | ICD-10-CM

## 2018-07-31 DIAGNOSIS — I82412 Acute embolism and thrombosis of left femoral vein: Secondary | ICD-10-CM

## 2018-07-31 DIAGNOSIS — E782 Mixed hyperlipidemia: Secondary | ICD-10-CM

## 2018-07-31 MED ORDER — APIXABAN 5 MG PO TABS: 10.0000 mg | ORAL_TABLET | Freq: Every day | ORAL | Status: DC

## 2018-07-31 NOTE — Progress Notes (Signed)
Patient: Natalie Frey Female    DOB: [birth year]   62 y.o.   MRN: [id number] Visit Date: 07/31/2018  Today's Provider: Langston Reusing, NP   Chief Complaint  Patient presents with  . Follow-up    wants to get off blood thinner   Subjective:    HPI Natalie Frey 62 y/o female presents for follow up on DVT and lab review. She states that she wants to get off her apixaban, because she can't remember to take it at night. She measures her lower legs daily. She reports that her left lower leg swells when she stands for a long time. She experiences some discomfort to left lower leg when walking too long and fast, but denies pain to left leg.  Hypertension: BP  Is 148/89, states will monitor and bring log to next visit.   Hyperlipidemia: Lipid profile abnormal, she states will work on her diet,reports eating more fruits and vegetables and will start going to the Gym.  She continues to smoke 1 pack a day and expresses the desire to cut back. She states that she's in good health. Denies HA, chest pain, palpitation and shortness of breath.     No Known Allergies Previous Medications   ACETAMINOPHEN (TYLENOL) 325 MG TABLET    Take 2 tablets (650 mg total) by mouth every 6 (six) hours as needed for mild pain (or Fever >/= 101).   APIXABAN (ELIQUIS) 5 MG TABS TABLET    Take 2 tablets (10 mg total) by mouth daily. Start taking 10 mg daily   NICOTINE (NICODERM CQ - DOSED IN MG/24 HOURS) 21 MG/24HR PATCH    Place 1 patch (21 mg total) onto the skin daily.   TRAMADOL (ULTRAM) 50 MG TABLET    Take 1 tablet (50 mg total) by mouth every 6 (six) hours as needed for moderate pain.    Review of Systems  Constitutional: Negative.   HENT: Negative.   Eyes: Negative.   Respiratory: Negative.   Cardiovascular: Negative.   Gastrointestinal: Negative.   Genitourinary: Negative.   Musculoskeletal: Negative.   Skin: Negative.   Neurological: Negative.   Hematological: Bruises/bleeds easily (Takes Apixaban for  DVT).  Psychiatric/Behavioral: Negative.     Social History   Tobacco Use  . Smoking status: Current Every Day Smoker    Packs/day: 1.00    Types: Cigarettes  . Smokeless tobacco: Never Used  Substance Use Topics  . Alcohol use: No   Objective:   BP (!) 148/89 (BP Location: Left Wrist, Patient Position: Sitting)   Pulse 80   Ht 5\' 4"  (1.626 m)   Wt 190 lb 14.4 oz (86.6 kg)   SpO2 96%   BMI 32.77 kg/m   Physical Exam  Constitutional: She is oriented to person, place, and time. She appears well-developed and well-nourished.  HENT:  Head: Normocephalic and atraumatic.  Eyes: Pupils are equal, round, and reactive to light. EOM are normal.  Neck: Normal range of motion.  Cardiovascular: Normal rate and regular rhythm.  Pulmonary/Chest: Effort normal and breath sounds normal.  Abdominal: Soft. Bowel sounds are normal.  Musculoskeletal: Normal range of motion.  Neurological: She is alert and oriented to person, place, and time.  Skin: Skin is warm and dry. Capillary refill takes less than 2 seconds.  Psychiatric: She has a normal mood and affect.        Assessment & Plan:     1. Acute deep vein thrombosis (DVT) of femoral vein of left lower extremity ([place]) Continues  10 mg Apixaban daily for DVT. Continues measuring left lower leg. Monitor for blood in urine and stool  2. Tobacco use disorder Educated on smoking cessation  3. Mixed hyperlipidemia Continue low fat low cholesterol diet, Exercise 30 minutes daily, Recheck fasting lipid panel in 3 months 10/29/2018  4. Prediabetes Continue weight loss regimen, low calorie diet. Recheck HgbA1c in 3 months  5. Elevated blood pressure reading Monitor and document BP, bring log to appointment.  6. Healthcare maintenance Flu vaccine administered       [place] Jerold Coombe, NP   [facility]

## 2018-07-31 NOTE — Patient Instructions (Addendum)
d Diabetes Mellitus and Nutrition When you have diabetes (diabetes mellitus), it is very important to have healthy eating habits because your blood sugar (glucose) levels are greatly affected by what you eat and drink. Eating healthy foods in the appropriate amounts, at about the same times every day, can help you:  Control your blood glucose.  Lower your risk of heart disease.  Improve your blood pressure.  Reach or maintain a healthy weight.  Every person with diabetes is different, and each person has different needs for a meal plan. Your health care provider may recommend that you work with a diet and nutrition specialist (dietitian) to make a meal plan that is best for you. Your meal plan may vary depending on factors such as:  The calories you need.  The medicines you take.  Your weight.  Your blood glucose, blood pressure, and cholesterol levels.  Your activity level.  Other health conditions you have, such as heart or kidney disease.  How do carbohydrates affect me? Carbohydrates affect your blood glucose level more than any other type of food. Eating carbohydrates naturally increases the amount of glucose in your blood. Carbohydrate counting is a method for keeping track of how many carbohydrates you eat. Counting carbohydrates is important to keep your blood glucose at a healthy level, especially if you use insulin or take certain oral diabetes medicines. It is important to know how many carbohydrates you can safely have in each meal. This is different for every person. Your dietitian can help you calculate how many carbohydrates you should have at each meal and for snack. Foods that contain carbohydrates include:  Bread, cereal, rice, pasta, and crackers.  Potatoes and corn.  Peas, beans, and lentils.  Milk and yogurt.  Fruit and juice.  Desserts, such as cakes, cookies, ice cream, and candy.  How does alcohol affect me? Alcohol can cause a sudden decrease in  blood glucose (hypoglycemia), especially if you use insulin or take certain oral diabetes medicines. Hypoglycemia can be a life-threatening condition. Symptoms of hypoglycemia (sleepiness, dizziness, and confusion) are similar to symptoms of having too much alcohol. If your health care provider says that alcohol is safe for you, follow these guidelines:  Limit alcohol intake to no more than 1 drink per day for nonpregnant women and 2 drinks per day for men. One drink equals 12 oz of beer, 5 oz of wine, or 1 oz of hard liquor.  Do not drink on an empty stomach.  Keep yourself hydrated with water, diet soda, or unsweetened iced tea.  Keep in mind that regular soda, juice, and other mixers may contain a lot of sugar and must be counted as carbohydrates.  What are tips for following this plan? Reading food labels  Start by checking the serving size on the label. The amount of calories, carbohydrates, fats, and other nutrients listed on the label are based on one serving of the food. Many foods contain more than one serving per package.  Check the total grams (g) of carbohydrates in one serving. You can calculate the number of servings of carbohydrates in one serving by dividing the total carbohydrates by 15. For example, if a food has 30 g of total carbohydrates, it would be equal to 2 servings of carbohydrates.  Check the number of grams (g) of saturated and trans fats in one serving. Choose foods that have low or no amount of these fats.  Check the number of milligrams (mg) of sodium in one serving. Most  people should limit total sodium intake to less than 2,300 mg per day.  Always check the nutrition information of foods labeled as "low-fat" or "nonfat". These foods may be higher in added sugar or refined carbohydrates and should be avoided.  Talk to your dietitian to identify your daily goals for nutrients listed on the label. Shopping  Avoid buying canned, premade, or processed foods.  These foods tend to be high in fat, sodium, and added sugar.  Shop around the outside edge of the grocery store. This includes fresh fruits and vegetables, bulk grains, fresh meats, and fresh dairy. Cooking  Use low-heat cooking methods, such as baking, instead of high-heat cooking methods like deep frying.  Cook using healthy oils, such as olive, canola, or sunflower oil.  Avoid cooking with butter, cream, or high-fat meats. Meal planning  Eat meals and snacks regularly, preferably at the same times every day. Avoid going long periods of time without eating.  Eat foods high in fiber, such as fresh fruits, vegetables, beans, and whole grains. Talk to your dietitian about how many servings of carbohydrates you can eat at each meal.  Eat 4-6 ounces of lean protein each day, such as lean meat, chicken, fish, eggs, or tofu. 1 ounce is equal to 1 ounce of meat, chicken, or fish, 1 egg, or 1/4 cup of tofu.  Eat some foods each day that contain healthy fats, such as avocado, nuts, seeds, and fish. Lifestyle   Check your blood glucose regularly.  Exercise at least 30 minutes 5 or more days each week, or as told by your health care provider.  Take medicines as told by your health care provider.  Do not use any products that contain nicotine or tobacco, such as cigarettes and e-cigarettes. If you need help quitting, ask your health care provider.  Work with a Social worker or diabetes educator to identify strategies to manage stress and any emotional and social challenges. What are some questions to ask my health care provider?  Do I need to meet with a diabetes educator?  Do I need to meet with a dietitian?  What number can I call if I have questions?  When are the best times to check my blood glucose? Where to find more information:  American Diabetes Association: diabetes.org/food-and-fitness/food  Academy of Nutrition and Dietetics:  PokerClues.dk  [employer] of Diabetes and Digestive and Kidney Diseases (NIH): ContactWire.be Summary  A healthy meal plan will help you control your blood glucose and maintain a healthy lifestyle.  Working with a diet and nutrition specialist (dietitian) can help you make a meal plan that is best for you.  Keep in mind that carbohydrates and alcohol have immediate effects on your blood glucose levels. It is important to count carbohydrates and to use alcohol carefully. This information is not intended to replace advice given to you by your health care provider. Make sure you discuss any questions you have with your health care provider. Document Released: 06/14/2005 Document Revised: 10/22/2016 Document Reviewed: 10/22/2016 Elsevier Interactive Patient Education  2018 [employer].  Fat and Cholesterol Restricted Diet Getting too much fat and cholesterol in your diet may cause health problems. Following this diet helps keep your fat and cholesterol at normal levels. This can keep you from getting sick. What types of fat should I choose?  Choose monosaturated and polyunsaturated fats. These are found in foods such as olive oil, canola oil, flaxseeds, walnuts, almonds, and seeds.  Eat more omega-3 fats. Good choices include salmon,  mackerel, sardines, tuna, flaxseed oil, and ground flaxseeds.  Limit saturated fats. These are in animal products such as meats, butter, and cream. They can also be in plant products such as palm oil, palm kernel oil, and coconut oil.  Avoid foods with partially hydrogenated oils in them. These contain trans fats. Examples of foods that have trans fats are stick margarine, some tub margarines, cookies, crackers, and other baked goods. What general guidelines do I need to follow?  Check food labels. Look for the words "trans fat" and  "saturated fat."  When preparing a meal: ? Fill half of your plate with vegetables and green salads. ? Fill one fourth of your plate with whole grains. Look for the word "whole" as the first word in the ingredient list. ? Fill one fourth of your plate with lean protein foods.  Eat more foods that have fiber, like apples, carrots, beans, peas, and barley.  Eat more home-cooked foods. Eat less at restaurants and buffets.  Limit or avoid alcohol.  Limit foods high in starch and sugar.  Limit fried foods.  Cook foods without frying them. Baking, boiling, grilling, and broiling are all great options.  Lose weight if you are overweight. Losing even a small amount of weight can help your overall health. It can also help prevent diseases such as diabetes and heart disease. What foods can I eat? Grains Whole grains, such as whole wheat or whole grain breads, crackers, cereals, and pasta. Unsweetened oatmeal, bulgur, barley, quinoa, or brown rice. Corn or whole wheat flour tortillas. Vegetables Fresh or frozen vegetables (raw, steamed, roasted, or grilled). Green salads. Fruits All fresh, canned (in natural juice), or frozen fruits. Meat and Other Protein Products Ground beef (85% or leaner), grass-fed beef, or beef trimmed of fat. Skinless chicken or [place]. Ground chicken or [place]. Pork trimmed of fat. All fish and seafood. Eggs. Dried beans, peas, or lentils. Unsalted nuts or seeds. Unsalted canned or dry beans. Dairy Low-fat dairy products, such as skim or 1% milk, 2% or reduced-fat cheeses, low-fat ricotta or cottage cheese, or plain low-fat yogurt. Fats and Oils Tub margarines without trans fats. Light or reduced-fat mayonnaise and salad dressings. Avocado. Olive, canola, sesame, or safflower oils. Natural peanut or almond butter (choose ones without added sugar and oil). The items listed above may not be a complete list of recommended foods or beverages. Contact your dietitian for more  options. What foods are not recommended? Grains White bread. White pasta. White rice. Cornbread. Bagels, pastries, and croissants. Crackers that contain trans fat. Vegetables White potatoes. Corn. Creamed or fried vegetables. Vegetables in a cheese sauce. Fruits Dried fruits. Canned fruit in light or heavy syrup. Fruit juice. Meat and Other Protein Products Fatty cuts of meat. Ribs, chicken wings, bacon, sausage, bologna, salami, chitterlings, fatback, hot dogs, bratwurst, and packaged luncheon meats. Liver and organ meats. Dairy Whole or 2% milk, cream, half-and-half, and cream cheese. Whole milk cheeses. Whole-fat or sweetened yogurt. Full-fat cheeses. Nondairy creamers and whipped toppings. Processed cheese, cheese spreads, or cheese curds. Sweets and Desserts Corn syrup, sugars, honey, and molasses. Candy. Jam and jelly. Syrup. Sweetened cereals. Cookies, pies, cakes, donuts, muffins, and ice cream. Fats and Oils Butter, stick margarine, lard, shortening, ghee, or bacon fat. Coconut, palm kernel, or palm oils. Beverages Alcohol. Sweetened drinks (such as sodas, lemonade, and fruit drinks or punches). The items listed above may not be a complete list of foods and beverages to avoid. Contact your dietitian for more information. This  information is not intended to replace advice given to you by your health care provider. Make sure you discuss any questions you have with your health care provider. Document Released: 03/18/2012 Document Revised: 05/24/2016 Document Reviewed: 12/17/2013 Elsevier Interactive Patient Education  2018 [place] DASH stands for "Dietary Approaches to Stop Hypertension." The DASH eating plan is a healthy eating plan that has been shown to reduce high blood pressure (hypertension). It may also reduce your risk for type 2 diabetes, heart disease, and stroke. The DASH eating plan may also help with weight loss. What are tips for following this  plan? General guidelines  Avoid eating more than 2,300 mg (milligrams) of salt (sodium) a day. If you have hypertension, you may need to reduce your sodium intake to 1,500 mg a day.  Limit alcohol intake to no more than 1 drink a day for nonpregnant women and 2 drinks a day for men. One drink equals 12 oz of beer, 5 oz of wine, or 1 oz of hard liquor.  Work with your health care provider to maintain a healthy body weight or to lose weight. Ask what an ideal weight is for you.  Get at least 30 minutes of exercise that causes your heart to beat faster (aerobic exercise) most days of the week. Activities may include walking, swimming, or biking.  Work with your health care provider or diet and nutrition specialist (dietitian) to adjust your eating plan to your individual calorie needs. Reading food labels  Check food labels for the amount of sodium per serving. Choose foods with less than 5 percent of the Daily Value of sodium. Generally, foods with less than 300 mg of sodium per serving fit into this eating plan.  To find whole grains, look for the word "whole" as the first word in the ingredient list. Shopping  Buy products labeled as "low-sodium" or "no salt added."  Buy fresh foods. Avoid canned foods and premade or frozen meals. Cooking  Avoid adding salt when cooking. Use salt-free seasonings or herbs instead of table salt or sea salt. Check with your health care provider or pharmacist before using salt substitutes.  Do not fry foods. Cook foods using healthy methods such as baking, boiling, grilling, and broiling instead.  Cook with heart-healthy oils, such as olive, canola, soybean, or sunflower oil. Meal planning   Eat a balanced diet that includes: ? 5 or more servings of fruits and vegetables each day. At each meal, try to fill half of your plate with fruits and vegetables. ? Up to 6-8 servings of whole grains each day. ? Less than 6 oz of lean meat, poultry, or fish each  day. A 3-oz serving of meat is about the same size as a deck of cards. One egg equals 1 oz. ? 2 servings of low-fat dairy each day. ? A serving of nuts, seeds, or beans 5 times each week. ? Heart-healthy fats. Healthy fats called Omega-3 fatty acids are found in foods such as flaxseeds and coldwater fish, like sardines, salmon, and mackerel.  Limit how much you eat of the following: ? Canned or prepackaged foods. ? Food that is high in trans fat, such as fried foods. ? Food that is high in saturated fat, such as fatty meat. ? Sweets, desserts, sugary drinks, and other foods with added sugar. ? Full-fat dairy products.  Do not salt foods before eating.  Try to eat at least 2 vegetarian meals each week.  Eat more home-cooked  food and less restaurant, buffet, and fast food.  When eating at a restaurant, ask that your food be prepared with less salt or no salt, if possible. What foods are recommended? The items listed may not be a complete list. Talk with your dietitian about what dietary choices are best for you. Grains Whole-grain or whole-wheat bread. Whole-grain or whole-wheat pasta. Brown rice. [place]. Bulgur. Whole-grain and low-sodium cereals. Pita bread. Low-fat, low-sodium crackers. Whole-wheat flour tortillas. Vegetables Fresh or frozen vegetables (raw, steamed, roasted, or grilled). Low-sodium or reduced-sodium tomato and vegetable juice. Low-sodium or reduced-sodium tomato sauce and tomato paste. Low-sodium or reduced-sodium canned vegetables. Fruits All fresh, dried, or frozen fruit. Canned fruit in natural juice (without added sugar). Meat and other protein foods Skinless chicken or [place]. Ground chicken or [place]. Pork with fat trimmed off. Fish and seafood. Egg whites. Dried beans, peas, or lentils. Unsalted nuts, nut butters, and seeds. Unsalted canned beans. Lean cuts of beef with fat trimmed off. Low-sodium, lean deli meat. Dairy Low-fat (1%) or fat-free (skim)  milk. Fat-free, low-fat, or reduced-fat cheeses. Nonfat, low-sodium ricotta or cottage cheese. Low-fat or nonfat yogurt. Low-fat, low-sodium cheese. Fats and oils Soft margarine without trans fats. Vegetable oil. Low-fat, reduced-fat, or light mayonnaise and salad dressings (reduced-sodium). Canola, safflower, olive, soybean, and sunflower oils. Avocado. Seasoning and other foods Herbs. Spices. Seasoning mixes without salt. Unsalted popcorn and pretzels. Fat-free sweets. What foods are not recommended? The items listed may not be a complete list. Talk with your dietitian about what dietary choices are best for you. Grains Baked goods made with fat, such as croissants, muffins, or some breads. Dry pasta or rice meal packs. Vegetables Creamed or fried vegetables. Vegetables in a cheese sauce. Regular canned vegetables (not low-sodium or reduced-sodium). Regular canned tomato sauce and paste (not low-sodium or reduced-sodium). Regular tomato and vegetable juice (not low-sodium or reduced-sodium). Angie Fava. Olives. Fruits Canned fruit in a light or heavy syrup. Fried fruit. Fruit in cream or butter sauce. Meat and other protein foods Fatty cuts of meat. Ribs. Fried meat. Berniece Salines. Sausage. Bologna and other processed lunch meats. Salami. Fatback. Hotdogs. Bratwurst. Salted nuts and seeds. Canned beans with added salt. Canned or smoked fish. Whole eggs or egg yolks. Chicken or [place] with skin. Dairy Whole or 2% milk, cream, and half-and-half. Whole or full-fat cream cheese. Whole-fat or sweetened yogurt. Full-fat cheese. Nondairy creamers. Whipped toppings. Processed cheese and cheese spreads. Fats and oils Butter. Stick margarine. Lard. Shortening. Ghee. Bacon fat. Tropical oils, such as coconut, palm kernel, or palm oil. Seasoning and other foods Salted popcorn and pretzels. Onion salt, garlic salt, seasoned salt, table salt, and sea salt. Worcestershire sauce. Tartar sauce. Barbecue sauce. Teriyaki  sauce. Soy sauce, including reduced-sodium. Steak sauce. Canned and packaged gravies. Fish sauce. Oyster sauce. Cocktail sauce. Horseradish that you find on the shelf. Ketchup. Mustard. Meat flavorings and tenderizers. Bouillon cubes. Hot sauce and Tabasco sauce. Premade or packaged marinades. Premade or packaged taco seasonings. Relishes. Regular salad dressings. Where to find more information:  National Heart, Lung, and [place]: https://wilson-eaton.com/  American Heart Association: www.heart.org Summary  The DASH eating plan is a healthy eating plan that has been shown to reduce high blood pressure (hypertension). It may also reduce your risk for type 2 diabetes, heart disease, and stroke.  With the DASH eating plan, you should limit salt (sodium) intake to 2,300 mg a day. If you have hypertension, you may need to reduce your sodium intake to 1,500 mg  a day.  When on the DASH eating plan, aim to eat more fresh fruits and vegetables, whole grains, lean proteins, low-fat dairy, and heart-healthy fats.  Work with your health care provider or diet and nutrition specialist (dietitian) to adjust your eating plan to your individual calorie needs. This information is not intended to replace advice given to you by your health care provider. Make sure you discuss any questions you have with your health care provider. Document Released: 09/06/2011 Document Revised: 09/10/2016 Document Reviewed: 09/10/2016 Elsevier Interactive Patient Education  2018 [employer].  Smoking Tobacco Information Smoking tobacco will very likely harm your health. Tobacco contains a poisonous (toxic), colorless chemical called nicotine. Nicotine affects the brain and makes tobacco addictive. This change in your brain can make it hard to stop smoking. Tobacco also has other toxic chemicals that can hurt your body and raise your risk of many cancers. How can smoking tobacco affect me? Smoking tobacco can increase your chances  of having serious health conditions, such as:  Cancer. Smoking is most commonly associated with lung cancer, but can lead to cancer in other parts of the body.  Chronic obstructive pulmonary disease (COPD). This is a long-term lung condition that makes it hard to breathe. It also gets worse over time.  High blood pressure (hypertension), heart disease, stroke, or heart attack.  Lung infections, such as pneumonia.  Cataracts. This is when the lenses in the eyes become clouded.  Digestive problems. This may include peptic ulcers, heartburn, and gastroesophageal reflux disease (GERD).  Oral health problems, such as gum disease and tooth loss.  Loss of taste and smell.  Smoking can affect your appearance by causing:  Wrinkles.  Yellow or stained teeth, fingers, and fingernails.  Smoking tobacco can also affect your social life.  Many workplaces, [employer], hotels, and public places are tobacco-free. This means that you may experience challenges in finding places to smoke when away from home.  The cost of a smoking habit can be expensive. Expenses for someone who smokes come in two ways: ? You spend money on a regular basis to buy tobacco. ? Your health care costs in the long-term are higher if you smoke.  Tobacco smoke can also affect the health of those around you. Children of smokers have greater chances of: ? Sudden infant death syndrome (SIDS). ? Ear infections. ? Lung infections.  What lifestyle changes can be made?  Do not start smoking. Quit if you already do.  To quit smoking: ? Make a plan to quit smoking and commit yourself to it. Look for programs to help you and ask your health care provider for recommendations and ideas. ? Talk with your health care provider about using nicotine replacement medicines to help you quit. Medicine replacement medicines include gum, lozenges, patches, sprays, or pills. ? Do not replace cigarette smoking with electronic cigarettes,  which are commonly called e-cigarettes. The safety of e-cigarettes is not known, and some may contain harmful chemicals. ? Avoid places, people, or situations that tempt you to smoke. ? If you try to quit but return to smoking, don't give up hope. It is very common for people to try a number of times before they fully succeed. When you feel ready again, give it another try.  Quitting smoking might affect the way you eat as well as your weight. Be prepared to monitor your eating habits. Get support in planning and following a healthy diet.  Ask your health care provider about having regular tests (screenings)  to check for cancer. This may include blood tests, imaging tests, and other tests.  Exercise regularly. Consider taking walks, joining a gym, or doing yoga or exercise classes.  Develop skills to manage your stress. These skills include meditation. What are the benefits of quitting smoking? By quitting smoking, you may:  Lower your risk of getting cancer and other diseases caused by smoking.  Live longer.  Breathe better.  Lower your blood pressure and heart rate.  Stop your addiction to tobacco.  Stop creating secondhand smoke that hurts other people.  Improve your sense of taste and smell.  Look better over time, due to having fewer wrinkles and less staining.  What can happen if changes are not made? If you do not stop smoking, you may:  Get cancer and other diseases.  Develop COPD or other long-term (chronic) lung conditions.  Develop serious problems with your heart and blood vessels (cardiovascular system).  Need more tests to screen for problems caused by smoking.  Have higher, long-term healthcare costs from medicines or treatments related to smoking.  Continue to have worsening changes in your lungs, mouth, and nose.  Where to find support: To get support to quit smoking, consider:  Asking your health care provider for more information and  resources.  Taking classes to learn more about quitting smoking.  Looking for local organizations that offer resources about quitting smoking.  Joining a support group for people who want to quit smoking in your local community.  Where to find more information: You may find more information about quitting smoking from:  HelpGuide.org: www.helpguide.org/articles/addictions/how-to-quit-smoking.htm  https://hall.com/: smokefree.gov  American Lung Association: www.lung.org  Contact a health care provider if:  You have problems breathing.  Your lips, nose, or fingers turn blue.  You have chest pain.  You are coughing up blood.  You feel faint or you pass out.  You have other noticeable changes that cause you to worry. Summary  Smoking tobacco can negatively affect your health, the health of those around you, your finances, and your social life.  Do not start smoking. Quit if you already do. If you need help quitting, ask your health care provider.  Think about joining a support group for people who want to quit smoking in your local community. There are many effective programs that will help you to quit this behavior. This information is not intended to replace advice given to you by your health care provider. Make sure you discuss any questions you have with your health care provider. Document Released: 10/02/2016 Document Revised: 10/02/2016 Document Reviewed: 10/02/2016 Elsevier Interactive Patient Education  [employer].

## 2018-10-07 ENCOUNTER — Other Ambulatory Visit: Payer: Self-pay | Admitting: Adult Health Nurse Practitioner

## 2018-10-07 DIAGNOSIS — I82412 Acute embolism and thrombosis of left femoral vein: Secondary | ICD-10-CM

## 2018-10-22 ENCOUNTER — Other Ambulatory Visit: Payer: Self-pay

## 2018-10-22 DIAGNOSIS — R030 Elevated blood-pressure reading, without diagnosis of hypertension: Secondary | ICD-10-CM

## 2018-10-22 DIAGNOSIS — E782 Mixed hyperlipidemia: Secondary | ICD-10-CM

## 2018-10-22 DIAGNOSIS — R7303 Prediabetes: Secondary | ICD-10-CM

## 2018-10-22 DIAGNOSIS — Z0000 Encounter for general adult medical examination without abnormal findings: Secondary | ICD-10-CM

## 2018-10-23 ENCOUNTER — Other Ambulatory Visit: Payer: Self-pay | Admitting: Gerontology

## 2018-10-23 DIAGNOSIS — E782 Mixed hyperlipidemia: Secondary | ICD-10-CM

## 2018-10-23 LAB — LIPID PANEL
Chol/HDL Ratio: 5.8 ratio — ABNORMAL HIGH (ref 0.0–4.4)
Cholesterol, Total: 238 mg/dL — ABNORMAL HIGH (ref 100–199)
HDL: 41 mg/dL (ref >39)
LDL Calculated: 161 mg/dL — ABNORMAL HIGH (ref 0–99)
Triglycerides: 181 mg/dL — ABNORMAL HIGH (ref 0–149)
VLDL Cholesterol Cal: 36 mg/dL (ref 5–40)

## 2018-10-23 LAB — CBC WITH DIFFERENTIAL/PLATELET
Basophils Absolute: 0 10*3/uL (ref 0.0–0.2)
Basos: 0 %
EOS (ABSOLUTE): 0.2 10*3/uL (ref 0.0–0.4)
Eos: 2 %
Hematocrit: 44 % (ref 34.0–46.6)
Hemoglobin: 15 g/dL (ref 11.1–15.9)
Immature Grans (Abs): 0 10*3/uL (ref 0.0–0.1)
Immature Granulocytes: 0 %
Lymphocytes Absolute: 3.1 10*3/uL (ref 0.7–3.1)
Lymphs: 35 %
MCH: 31.9 pg (ref 26.6–33.0)
MCHC: 34.1 g/dL (ref 31.5–35.7)
MCV: 94 fL (ref 79–97)
Monocytes Absolute: 0.5 10*3/uL (ref 0.1–0.9)
Monocytes: 6 %
Neutrophils Absolute: 5 10*3/uL (ref 1.4–7.0)
Neutrophils: 57 %
Platelets: 264 10*3/uL (ref 150–450)
RBC: 4.7 x10E6/uL (ref 3.77–5.28)
RDW: 13 % (ref 11.7–15.4)
WBC: 8.8 10*3/uL (ref 3.4–10.8)

## 2018-10-23 LAB — HEMOGLOBIN A1C
Est. average glucose Bld gHb Est-mCnc: 143 mg/dL
Hgb A1c MFr Bld: 6.6 % — ABNORMAL HIGH (ref 4.8–5.6)

## 2018-10-23 LAB — COMPREHENSIVE METABOLIC PANEL
ALT: 13 IU/L (ref 0–32)
AST: 16 IU/L (ref 0–40)
Albumin/Globulin Ratio: 1.6 (ref 1.2–2.2)
Albumin: 4.3 g/dL (ref 3.8–4.9)
Alkaline Phosphatase: 68 IU/L (ref 39–117)
BUN/Creatinine Ratio: 14 (ref 12–28)
BUN: 9 mg/dL (ref 8–27)
Bilirubin Total: 0.6 mg/dL (ref 0.0–1.2)
CO2: 20 mmol/L (ref 20–29)
Calcium: 9.1 mg/dL (ref 8.7–10.3)
Chloride: 103 mmol/L (ref 96–106)
Creatinine, Ser: 0.66 mg/dL (ref 0.57–1.00)
GFR calc Af Amer: 111 mL/min/{1.73_m2} (ref >59)
GFR calc non Af Amer: 96 mL/min/{1.73_m2} (ref >59)
Globulin, Total: 2.7 g/dL (ref 1.5–4.5)
Glucose: 135 mg/dL — ABNORMAL HIGH (ref 65–99)
Potassium: 4.4 mmol/L (ref 3.5–5.2)
Sodium: 140 mmol/L (ref 134–144)
Total Protein: 7 g/dL (ref 6.0–8.5)

## 2018-10-23 MED ORDER — ATORVASTATIN CALCIUM 10 MG PO TABS: 10.0000 mg | ORAL_TABLET | Freq: Every day | ORAL | 0 refills | Status: DC

## 2018-10-30 ENCOUNTER — Encounter: Payer: Self-pay | Admitting: Gerontology

## 2018-10-30 ENCOUNTER — Ambulatory Visit: Payer: Self-pay | Admitting: Gerontology

## 2018-10-30 ENCOUNTER — Other Ambulatory Visit: Payer: Self-pay

## 2018-10-30 VITALS — BP 149/82 | HR 79 | Ht 64.0 in | Wt 185.6 lb

## 2018-10-30 DIAGNOSIS — E782 Mixed hyperlipidemia: Secondary | ICD-10-CM

## 2018-10-30 DIAGNOSIS — R030 Elevated blood-pressure reading, without diagnosis of hypertension: Secondary | ICD-10-CM

## 2018-10-30 DIAGNOSIS — I82412 Acute embolism and thrombosis of left femoral vein: Secondary | ICD-10-CM

## 2018-10-30 DIAGNOSIS — R7303 Prediabetes: Secondary | ICD-10-CM

## 2018-10-30 DIAGNOSIS — F17200 Nicotine dependence, unspecified, uncomplicated: Secondary | ICD-10-CM

## 2018-10-30 DIAGNOSIS — Z0000 Encounter for general adult medical examination without abnormal findings: Secondary | ICD-10-CM

## 2018-10-30 MED ORDER — AMLODIPINE BESYLATE 5 MG PO TABS
2.5000 mg | ORAL_TABLET | Freq: Every day | ORAL | 0 refills | Status: DC
Start: 2019-01-21 — End: 2018-11-13

## 2018-10-30 MED ORDER — ATORVASTATIN CALCIUM 10 MG PO TABS: 20.0000 mg | ORAL_TABLET | Freq: Every day | ORAL | 3 refills | Status: DC

## 2018-10-30 NOTE — Patient Instructions (Addendum)
- start taking 20 mg Atorvastatin daily - Start 2.5 mg Amlodipine for Blood pressure daily Smoking Tobacco Information, Adult Smoking tobacco can be harmful to your health. Tobacco contains a poisonous (toxic), colorless chemical called nicotine. Nicotine is addictive. It changes the brain and can make it hard to stop smoking. Tobacco also has other toxic chemicals that can hurt your body and raise your risk of many cancers. How can smoking tobacco affect me? Smoking tobacco puts you at risk for:  Cancer. Smoking is most commonly associated with lung cancer, but can also lead to cancer in other parts of the body.  Chronic obstructive pulmonary disease (COPD). This is a long-term lung condition that makes it hard to breathe. It also gets worse over time.  High blood pressure (hypertension), heart disease, stroke, or heart attack.  Lung infections, such as pneumonia.  Cataracts. This is when the lenses in the eyes become clouded.  Digestive problems. This may include peptic ulcers, heartburn, and gastroesophageal reflux disease (GERD).  Oral health problems, such as gum disease and tooth loss.  Loss of taste and smell. Smoking can affect your appearance by causing:  Wrinkles.  Yellow or stained teeth, fingers, and fingernails. Smoking tobacco can also affect your social life, because:  It may be challenging to find places to smoke when away from home. Many workplaces, [employer], hotels, and public places are tobacco-free.  Smoking is expensive. This is due to the cost of tobacco and the long-term costs of treating health problems from smoking.  Secondhand smoke may affect those around you. Secondhand smoke can cause lung cancer, breathing problems, and heart disease. Children of smokers have a higher risk for: ? Sudden infant death syndrome (SIDS). ? Ear infections. ? Lung infections. If you currently smoke tobacco, quitting now can help you:  Lead a longer and healthier  life.  Look, smell, breathe, and feel better over time.  Save money.  Protect others from the harms of secondhand smoke. What actions can I take to prevent health problems? Quit smoking   Do not start smoking. Quit if you already do.  Make a plan to quit smoking and commit to it. Look for programs to help you and ask your health care provider for recommendations and ideas.  Set a date and write down all the reasons you want to quit.  Let your friends and family know you are quitting so they can help and support you. Consider finding friends who also want to quit. It can be easier to quit with someone else, so that you can support each other.  Talk with your health care provider about using nicotine replacement medicines to help you quit, such as gum, lozenges, patches, sprays, or pills.  Do not replace cigarette smoking with electronic cigarettes, which are commonly called e-cigarettes. The safety of e-cigarettes is not known, and some may contain harmful chemicals.  If you try to quit but return to smoking, stay positive. It is common to slip up when you first quit, so take it one day at a time.  Be prepared for cravings. When you feel the urge to smoke, chew gum or suck on hard candy. Lifestyle  Stay busy and take care of your body.  Drink enough fluid to keep your urine pale yellow.  Get plenty of exercise and eat a healthy diet. This can help prevent weight gain after quitting.  Monitor your eating habits. Quitting smoking can cause you to have a larger appetite than when you smoke.  Find ways to relax. Go out with friends or family to a movie or a restaurant where people do not smoke.  Ask your health care provider about having regular tests (screenings) to check for cancer. This may include blood tests, imaging tests, and other tests.  Find ways to manage your stress, such as meditation, yoga, or exercise. Where to find support To get support to quit smoking,  consider:  Asking your health care provider for more information and resources.  Taking classes to learn more about quitting smoking.  Looking for local organizations that offer resources about quitting smoking.  Joining a support group for people who want to quit smoking in your local community.  Calling the smokefree.gov counselor helpline: 1-800-Quit-Now (407)637-2867) Where to find more information You may find more information about quitting smoking from:  HelpGuide.org: www.helpguide.org  https://hall.com/: smokefree.gov  American Lung Association: www.lung.org Contact a health care provider if you:  Have problems breathing.  Notice that your lips, nose, or fingers turn blue.  Have chest pain.  Are coughing up blood.  Feel faint or you pass out.  Have other health changes that cause you to worry. Summary  Smoking tobacco can negatively affect your health, the health of those around you, your finances, and your social life.  Do not start smoking. Quit if you already do. If you need help quitting, ask your health care provider.  Think about joining a support group for people who want to quit smoking in your local community. There are many effective programs that will help you to quit this behavior. This information is not intended to replace advice given to you by your health care provider. Make sure you discuss any questions you have with your health care provider. Document Released: 10/02/2016 Document Revised: 11/06/2017 Document Reviewed: 10/02/2016 Elsevier Interactive Patient Education  2019 [place] and Cholesterol Restricted Eating Plan Getting too much fat and cholesterol in your diet may cause health problems. Choosing the right foods helps keep your fat and cholesterol at normal levels. This can keep you from getting certain diseases. Your doctor may recommend an eating plan that includes:  Total fat: ______% or less of total calories a  day.  Saturated fat: ______% or less of total calories a day.  Cholesterol: less than _________mg a day.  Fiber: ______g a day. What are tips for following this plan? Meal planning  At meals, divide your plate into four equal parts: ? Fill one-half of your plate with vegetables and green salads. ? Fill one-fourth of your plate with whole grains. ? Fill one-fourth of your plate with low-fat (lean) protein foods.  Eat fish that is high in omega-3 fats at least two times a week. This includes mackerel, tuna, sardines, and salmon.  Eat foods that are high in fiber, such as whole grains, beans, apples, broccoli, carrots, peas, and barley. General tips   Work with your doctor to lose weight if you need to.  Avoid: ? Foods with added sugar. ? Fried foods. ? Foods with partially hydrogenated oils.  Limit alcohol intake to no more than 1 drink a day for nonpregnant women and 2 drinks a day for men. One drink equals 12 oz of beer, 5 oz of wine, or 1 oz of hard liquor. Reading food labels  Check food labels for: ? Trans fats. ? Partially hydrogenated oils. ? Saturated fat (g) in each serving. ? Cholesterol (mg) in each serving. ? Fiber (g) in each serving.  Choose foods with  healthy fats, such as: ? Monounsaturated fats. ? Polyunsaturated fats. ? Omega-3 fats.  Choose grain products that have whole grains. Look for the word "whole" as the first word in the ingredient list. Cooking  Cook foods using low-fat methods. These include baking, boiling, grilling, and broiling.  Eat more home-cooked foods. Eat at restaurants and buffets less often.  Avoid cooking using saturated fats, such as butter, cream, palm oil, palm kernel oil, and coconut oil. Recommended foods  Fruits  All fresh, canned (in natural juice), or frozen fruits. Vegetables  Fresh or frozen vegetables (raw, steamed, roasted, or grilled). Green salads. Grains  Whole grains, such as whole wheat or whole  grain breads, crackers, cereals, and pasta. Unsweetened oatmeal, bulgur, barley, quinoa, or brown rice. Corn or whole wheat flour tortillas. Meats and other protein foods  Ground beef (85% or leaner), grass-fed beef, or beef trimmed of fat. Skinless chicken or [place]. Ground chicken or [place]. Pork trimmed of fat. All fish and seafood. Egg whites. Dried beans, peas, or lentils. Unsalted nuts or seeds. Unsalted canned beans. Nut butters without added sugar or oil. Dairy  Low-fat or nonfat dairy products, such as skim or 1% milk, 2% or reduced-fat cheeses, low-fat and fat-free ricotta or cottage cheese, or plain low-fat and nonfat yogurt. Fats and oils  Tub margarine without trans fats. Light or reduced-fat mayonnaise and salad dressings. Avocado. Olive, canola, sesame, or safflower oils. The items listed above may not be a complete list of foods and beverages you can eat. Contact a dietitian for more information. Foods to avoid Fruits  Canned fruit in heavy syrup. Fruit in cream or butter sauce. Fried fruit. Vegetables  Vegetables cooked in cheese, cream, or butter sauce. Fried vegetables. Grains  White bread. White pasta. White rice. Cornbread. Bagels, pastries, and croissants. Crackers and snack foods that contain trans fat and hydrogenated oils. Meats and other protein foods  Fatty cuts of meat. Ribs, chicken wings, bacon, sausage, bologna, salami, chitterlings, fatback, hot dogs, bratwurst, and packaged lunch meats. Liver and organ meats. Whole eggs and egg yolks. Chicken and [place] with skin. Fried meat. Dairy  Whole or 2% milk, cream, half-and-half, and cream cheese. Whole milk cheeses. Whole-fat or sweetened yogurt. Full-fat cheeses. Nondairy creamers and whipped toppings. Processed cheese, cheese spreads, and cheese curds. Beverages  Alcohol. Sugar-sweetened drinks such as sodas, lemonade, and fruit drinks. Fats and oils  Butter, stick margarine, lard, shortening, ghee, or  bacon fat. Coconut, palm kernel, and palm oils. Sweets and desserts  Corn syrup, sugars, honey, and molasses. Candy. Jam and jelly. Syrup. Sweetened cereals. Cookies, pies, cakes, donuts, muffins, and ice cream. The items listed above may not be a complete list of foods and beverages you should avoid. Contact a dietitian for more information. Summary  Choosing the right foods helps keep your fat and cholesterol at normal levels. This can keep you from getting certain diseases.  At meals, fill one-half of your plate with vegetables and green salads.  Eat high-fiber foods, like whole grains, beans, apples, carrots, peas, and barley.  Limit added sugar, saturated fats, alcohol, and fried foods. This information is not intended to replace advice given to you by your health care provider. Make sure you discuss any questions you have with your health care provider. Document Released: 03/18/2012 Document Revised: 05/21/2018 Document Reviewed: 06/04/2017 Elsevier Interactive Patient Education  2019 [place] DASH stands for "Dietary Approaches to Stop Hypertension." The DASH eating plan is a healthy eating  plan that has been shown to reduce high blood pressure (hypertension). It may also reduce your risk for type 2 diabetes, heart disease, and stroke. The DASH eating plan may also help with weight loss. What are tips for following this plan?  General guidelines  Avoid eating more than 2,300 mg (milligrams) of salt (sodium) a day. If you have hypertension, you may need to reduce your sodium intake to 1,500 mg a day.  Limit alcohol intake to no more than 1 drink a day for nonpregnant women and 2 drinks a day for men. One drink equals 12 oz of beer, 5 oz of wine, or 1 oz of hard liquor.  Work with your health care provider to maintain a healthy body weight or to lose weight. Ask what an ideal weight is for you.  Get at least 30 minutes of exercise that causes your heart to  beat faster (aerobic exercise) most days of the week. Activities may include walking, swimming, or biking.  Work with your health care provider or diet and nutrition specialist (dietitian) to adjust your eating plan to your individual calorie needs. Reading food labels   Check food labels for the amount of sodium per serving. Choose foods with less than 5 percent of the Daily Value of sodium. Generally, foods with less than 300 mg of sodium per serving fit into this eating plan.  To find whole grains, look for the word "whole" as the first word in the ingredient list. Shopping  Buy products labeled as "low-sodium" or "no salt added."  Buy fresh foods. Avoid canned foods and premade or frozen meals. Cooking  Avoid adding salt when cooking. Use salt-free seasonings or herbs instead of table salt or sea salt. Check with your health care provider or pharmacist before using salt substitutes.  Do not fry foods. Cook foods using healthy methods such as baking, boiling, grilling, and broiling instead.  Cook with heart-healthy oils, such as olive, canola, soybean, or sunflower oil. Meal planning  Eat a balanced diet that includes: ? 5 or more servings of fruits and vegetables each day. At each meal, try to fill half of your plate with fruits and vegetables. ? Up to 6-8 servings of whole grains each day. ? Less than 6 oz of lean meat, poultry, or fish each day. A 3-oz serving of meat is about the same size as a deck of cards. One egg equals 1 oz. ? 2 servings of low-fat dairy each day. ? A serving of nuts, seeds, or beans 5 times each week. ? Heart-healthy fats. Healthy fats called Omega-3 fatty acids are found in foods such as flaxseeds and coldwater fish, like sardines, salmon, and mackerel.  Limit how much you eat of the following: ? Canned or prepackaged foods. ? Food that is high in trans fat, such as fried foods. ? Food that is high in saturated fat, such as fatty meat. ? Sweets,  desserts, sugary drinks, and other foods with added sugar. ? Full-fat dairy products.  Do not salt foods before eating.  Try to eat at least 2 vegetarian meals each week.  Eat more home-cooked food and less restaurant, buffet, and fast food.  When eating at a restaurant, ask that your food be prepared with less salt or no salt, if possible. What foods are recommended? The items listed may not be a complete list. Talk with your dietitian about what dietary choices are best for you. Grains Whole-grain or whole-wheat bread. Whole-grain or whole-wheat pasta. Brown rice.  [place]. Bulgur. Whole-grain and low-sodium cereals. Pita bread. Low-fat, low-sodium crackers. Whole-wheat flour tortillas. Vegetables Fresh or frozen vegetables (raw, steamed, roasted, or grilled). Low-sodium or reduced-sodium tomato and vegetable juice. Low-sodium or reduced-sodium tomato sauce and tomato paste. Low-sodium or reduced-sodium canned vegetables. Fruits All fresh, dried, or frozen fruit. Canned fruit in natural juice (without added sugar). Meat and other protein foods Skinless chicken or [place]. Ground chicken or [place]. Pork with fat trimmed off. Fish and seafood. Egg whites. Dried beans, peas, or lentils. Unsalted nuts, nut butters, and seeds. Unsalted canned beans. Lean cuts of beef with fat trimmed off. Low-sodium, lean deli meat. Dairy Low-fat (1%) or fat-free (skim) milk. Fat-free, low-fat, or reduced-fat cheeses. Nonfat, low-sodium ricotta or cottage cheese. Low-fat or nonfat yogurt. Low-fat, low-sodium cheese. Fats and oils Soft margarine without trans fats. Vegetable oil. Low-fat, reduced-fat, or light mayonnaise and salad dressings (reduced-sodium). Canola, safflower, olive, soybean, and sunflower oils. Avocado. Seasoning and other foods Herbs. Spices. Seasoning mixes without salt. Unsalted popcorn and pretzels. Fat-free sweets. What foods are not recommended? The items listed may not be a  complete list. Talk with your dietitian about what dietary choices are best for you. Grains Baked goods made with fat, such as croissants, muffins, or some breads. Dry pasta or rice meal packs. Vegetables Creamed or fried vegetables. Vegetables in a cheese sauce. Regular canned vegetables (not low-sodium or reduced-sodium). Regular canned tomato sauce and paste (not low-sodium or reduced-sodium). Regular tomato and vegetable juice (not low-sodium or reduced-sodium). Angie Fava. Olives. Fruits Canned fruit in a light or heavy syrup. Fried fruit. Fruit in cream or butter sauce. Meat and other protein foods Fatty cuts of meat. Ribs. Fried meat. [place]ce Salines. Sausage. Bologna and other processed lunch meats. Salami. Fatback. Hotdogs. Bratwurst. Salted nuts and seeds. Canned beans with added salt. Canned or smoked fish. Whole eggs or egg yolks. Chicken or [place] with skin. Dairy Whole or 2% milk, cream, and half-and-half. Whole or full-fat cream cheese. Whole-fat or sweetened yogurt. Full-fat cheese. Nondairy creamers. Whipped toppings. Processed cheese and cheese spreads. Fats and oils Butter. Stick margarine. Lard. Shortening. Ghee. Bacon fat. Tropical oils, such as coconut, palm kernel, or palm oil. Seasoning and other foods Salted popcorn and pretzels. Onion salt, garlic salt, seasoned salt, table salt, and sea salt. Worcestershire sauce. Tartar sauce. Barbecue sauce. Teriyaki sauce. Soy sauce, including reduced-sodium. Steak sauce. Canned and packaged gravies. Fish sauce. Oyster sauce. Cocktail sauce. Horseradish that you find on the shelf. Ketchup. Mustard. Meat flavorings and tenderizers. Bouillon cubes. Hot sauce and Tabasco sauce. Premade or packaged marinades. Premade or packaged taco seasonings. Relishes. Regular salad dressings. Where to find more information:  National Heart, Lung, and [place]: https://wilson-eaton.com/  American Heart Association: www.heart.org Summary  The DASH eating plan is a  healthy eating plan that has been shown to reduce high blood pressure (hypertension). It may also reduce your risk for type 2 diabetes, heart disease, and stroke.  With the DASH eating plan, you should limit salt (sodium) intake to 2,300 mg a day. If you have hypertension, you may need to reduce your sodium intake to 1,500 mg a day.  When on the DASH eating plan, aim to eat more fresh fruits and vegetables, whole grains, lean proteins, low-fat dairy, and heart-healthy fats.  Work with your health care provider or diet and nutrition specialist (dietitian) to adjust your eating plan to your individual calorie needs. This information is not intended to replace advice given to you by your health care  provider. Make sure you discuss any questions you have with your health care provider. Document Released: 09/06/2011 Document Revised: 09/10/2016 Document Reviewed: 09/10/2016 Elsevier Interactive Patient Education  2019 [employer].

## 2018-10-30 NOTE — Progress Notes (Signed)
Established Patient Office Visit  Subjective:  Patient ID: Natalie Frey, female    DOB: 1956/04/05  Age: 63 y.o. MRN: [id number]  CC:  Chief Complaint  Patient presents with  . Follow-up    review labs, recently started Lipitor    HPI [name] 63 y/o female presents for follow up on Hypertension, Hyperlipidemia and lab review. She reports that her blood pressure has being running high, but does not check it at home. She denies headache, chest pain and palpitation.  She started taking 10 mg Atorvastatin 1 week ago and denies myalgia.  She reports that she continues to take Eliquis 5 mg bid, and denies bruises, hematuria , hematochezia, peripheral edema and claudication. She reports that she continues to smoke 1 pack of cigarette a day and admits the desire to quit. Otherwise she reports doing well and has no further concerns.    Past Medical History:  Diagnosis Date  . Cancer [facility])     Past Surgical History:  Procedure Laterality Date  . BLADDER SURGERY    . DILATION AND EVACUATION      Family History  Problem Relation Age of Onset  . Deep vein thrombosis Daughter     Social History   Socioeconomic History  . Marital status: Widowed    Spouse name: Not on file  . Number of children: Not on file  . Years of education: Not on file  . Highest education level: Not on file  Occupational History  . Not on file  Social Needs  . Financial resource strain: Not hard at all  . Food insecurity:    Worry: Never true    Inability: Never true  . Transportation needs:    Medical: No    Non-medical: No  Tobacco Use  . Smoking status: Current Every Day Smoker    Packs/day: 1.00    Types: Cigarettes  . Smokeless tobacco: Never Used  Substance and Sexual Activity  . Alcohol use: No  . Drug use: Not on file  . Sexual activity: Not on file  Lifestyle  . Physical activity:    Days per week: 6 days    Minutes per session: 40 min  . Stress: Not at all  Relationships  .  Social connections:    Talks on phone: More than three times a week    Gets together: Three times a week    Attends religious service: Never    Active member of club or organization: No    Attends meetings of clubs or organizations: Never    Relationship status: Living with partner  . Intimate partner violence:    Fear of current or ex partner: No    Emotionally abused: No    Physically abused: No    Forced sexual activity: No  Other Topics Concern  . Not on file  Social History Narrative   [name] she has been doing ok and does not need any assistance          - is currently on food stamps but those have been enough       NO consent form signed     Outpatient Medications Prior to Visit  Medication Sig Dispense Refill  . acetaminophen (TYLENOL) 325 MG tablet Take 2 tablets (650 mg total) by mouth every 6 (six) hours as needed for mild pain (or Fever >/= 101).    Marland Kitchen atorvastatin (LIPITOR) 10 MG tablet Take 1 tablet (10 mg total) by mouth daily. 30 tablet 0  .  ELIQUIS 5 MG TABS tablet TAKE ONE TABLET BY MOUTH 2 TIMES A DAY 180 tablet 0  . nicotine (NICODERM CQ - DOSED IN MG/24 HOURS) 21 mg/24hr patch Place 1 patch (21 mg total) onto the skin daily. 28 patch 0  . traMADol (ULTRAM) 50 MG tablet Take 1 tablet (50 mg total) by mouth every 6 (six) hours as needed for moderate pain. 30 tablet 0   No facility-administered medications prior to visit.     No Known Allergies  ROS Review of Systems  Constitutional: Negative.   HENT: Negative.   Eyes: Negative.   Respiratory: Negative.   Cardiovascular: Negative.   Gastrointestinal: Negative.   Genitourinary: Negative.   Skin: Negative.   Neurological: Negative.   Hematological: Negative.   Psychiatric/Behavioral: Negative.       Objective:    Physical Exam  Constitutional: She is oriented to person, place, and time. She appears well-developed and well-nourished.  HENT:  Head: Normocephalic and atraumatic.  Eyes: Pupils are  equal, round, and reactive to light. EOM are normal.  Neck: Normal range of motion.  Cardiovascular: Normal rate and regular rhythm.  Pulmonary/Chest: Effort normal and breath sounds normal.  Abdominal: Soft. Bowel sounds are normal.  Musculoskeletal: Normal range of motion.  Neurological: She is alert and oriented to person, place, and time.  Skin: Skin is warm and dry.  Psychiatric: She has a normal mood and affect. Her behavior is normal. Judgment and thought content normal.    BP (!) 149/82 (BP Location: Left Arm, Patient Position: Sitting, Cuff Size: Large)   Pulse 79   Ht 5' 4"  (1.626 m)   Wt 185 lb 9.6 oz (84.2 kg)   SpO2 99%   BMI 31.86 kg/m  Wt Readings from Last 3 Encounters:  10/30/18 185 lb 9.6 oz (84.2 kg)  07/31/18 190 lb 14.4 oz (86.6 kg)  04/24/18 197 lb 4.8 oz (89.5 kg)   Ms. Bound weight decreased 5 pounds and her BP was 149/82 when rechecked.  Health Maintenance Due  Topic Date Due  . Hepatitis C Screening  [birth year]  . TETANUS/TDAP  09/02/1977  . PAP SMEAR-Modifier  09/03/1979  . MAMMOGRAM  09/02/2008  . COLONOSCOPY  09/02/2008   She states that she has not done Mammogram, pap smear in the past 5 years and can't remember when colonoscopy was last done. She was provided with stool card for colon cancer screening and referral for Mammogram and Pap smear. There are no preventive care reminders to display for this patient.  Lab Results  Component Value Date   TSH 1.840 04/17/2018   Lab Results  Component Value Date   WBC 8.8 10/22/2018   HGB 15.0 10/22/2018   HCT 44.0 10/22/2018   MCV 94 10/22/2018   PLT 264 10/22/2018   Lab Results  Component Value Date   NA 140 10/22/2018   K 4.4 10/22/2018   CO2 20 10/22/2018   GLUCOSE 135 (H) 10/22/2018   BUN 9 10/22/2018   CREATININE 0.66 10/22/2018   BILITOT 0.6 10/22/2018   ALKPHOS 68 10/22/2018   AST 16 10/22/2018   ALT 13 10/22/2018   PROT 7.0 10/22/2018   ALBUMIN 4.3 10/22/2018   CALCIUM 9.1  10/22/2018   ANIONGAP 10 12/24/2017   Lab Results  Component Value Date   CHOL 238 (H) 10/22/2018   Lab Results  Component Value Date   HDL 41 10/22/2018   Lab Results  Component Value Date   LDLCALC 161 (H) 10/22/2018  Lab Results  Component Value Date   TRIG 181 (H) 10/22/2018   Lab Results  Component Value Date   CHOLHDL 5.8 (H) 10/22/2018   Ms. Raffield lipid panel was reviewed , she agreed to make dietary changes and will increase Atorvastatin to 20 mg daily. Lab Results  Component Value Date   HGBA1C 6.6 (H) 10/22/2018    Ms. Ragone HgbA1c increased from 6.2% 3 months ago to 6.6% , her blood glucose was checked during office visit and it was 120 mg/dl, she denies hypoglycemia or hyperglycemic , peripheral neuropathtic symptoms and blurry vision. She does not check her blood glucose at home. She denies myalgia since starting 10 mg Atorvastatin, and reports that she states that she will make dietary changes.   Assessment & Plan:   Problem List Items Addressed This Visit      Cardiovascular and Mediastinum   DVT (deep venous thrombosis) ([place])     Other   Tobacco use disorder    Other Visit Diagnoses    Mixed hyperlipidemia    -  Primary   Elevated blood pressure reading       Prediabetes       Healthcare maintenance         1. Elevated blood pressure reading - Ms. Milligan's blood pressure goal is < 140/90, she will start on 2.5 mg amlodipine and was educated on the medication side effects and was advised to notify provider. She was encouraged to continue on low salt diet, exercise 30 minutes daily. - amLODipine (NORVASC) 5 MG tablet; Take 0.5 tablets (2.5 mg total) by mouth daily.  Dispense: 30 tablet; Refill: 0 - Comp Met (CMET); Future  2. Mixed hyperlipidemia - She was encouraged to continue on low fat low cholesterol diet and notify provider for any side effects. Lipitor was increased to 20 mg daily. - atorvastatin (LIPITOR) 10 MG tablet; Take 2 tablets (20 mg  total) by mouth daily.  Dispense: 30 tablet; Refill: 3 - Lipid panel; Future - Urinalysis; Future  3. Prediabetes - Her goal A1c is < 7 %, She states that she does not want to start on Metformin, and agreed to do lifestyle modification first. She was educated on how to read food label, encouraged to continue on low carb diet, exercise 30 minutes daily. Will recheck A1c in 3 months. - HgB A1c; Future  4. Healthcare maintenance - She was provided with stool card for colon cancer screening and referral for Mammogram and Pap smear. - CBC w/Diff; Future  5. Tobacco use disorder - She was strongly encouraged on smoking cessation.  6. Deep vein thrombosis (DVT) of femoral vein of left lower extremity, unspecified chronicity ([place]) - She will continue on 5 mg Eliquis bid and continue to monitor for peripheral edema.  No orders of the defined types were placed in this encounter.   Follow-up: No follow-ups on file.  In 2 weeks  Tywanda Rice Jerold Coombe, NP

## 2018-11-13 ENCOUNTER — Encounter: Payer: Self-pay | Admitting: Gerontology

## 2018-11-13 ENCOUNTER — Ambulatory Visit: Payer: Self-pay | Admitting: Gerontology

## 2018-11-13 ENCOUNTER — Other Ambulatory Visit: Payer: Self-pay

## 2018-11-13 VITALS — BP 126/74 | HR 84 | Ht 64.0 in | Wt 180.8 lb

## 2018-11-13 DIAGNOSIS — E782 Mixed hyperlipidemia: Secondary | ICD-10-CM

## 2018-11-13 DIAGNOSIS — I82412 Acute embolism and thrombosis of left femoral vein: Secondary | ICD-10-CM

## 2018-11-13 DIAGNOSIS — F17200 Nicotine dependence, unspecified, uncomplicated: Secondary | ICD-10-CM

## 2018-11-13 DIAGNOSIS — I10 Essential (primary) hypertension: Secondary | ICD-10-CM

## 2018-11-13 NOTE — Progress Notes (Signed)
Established Patient Office Visit  Subjective:  Patient ID: Natalie Frey, female    DOB: 27-Aug-1956  Age: 63 y.o. MRN: [id number]  CC:  Chief Complaint  Patient presents with  . Follow-up    HPI Natalie Frey presents for follow up on hypertension and hyperlipidemia. She reports that she made dietary changes and monitors her blood pressure at home. She reports that her SBP is in the low to mid 120's and DBP is in the low to mid 70's. She denies headache, chest pain, palpitation and peripheral edema. She reports that she wants to stop taking Amlodipine and she continues to smoke 1 pack of cigarette a day and she admits to working on quitting.  She continues to take Eliquis 5 mg bid and denies bruises, hematuria, hematochezia, and claudication.  Hyperlipidemia: She admits to taking 20 mg of Atorvastatin daily, denies myalgia. She denies fever, chills and otherwise she reports that she's doing well and has no further complaint.    Past Medical History:  Diagnosis Date  . Cancer ([place])   . Hyperlipidemia     Past Surgical History:  Procedure Laterality Date  . BLADDER SURGERY    . DILATION AND EVACUATION      Family History  Problem Relation Age of Onset  . Deep vein thrombosis Daughter     Social History   Socioeconomic History  . Marital status: Widowed    Spouse name: Not on file  . Number of children: Not on file  . Years of education: Not on file  . Highest education level: Not on file  Occupational History  . Not on file  Social Needs  . Financial resource strain: Not hard at all  . Food insecurity:    Worry: Never true    Inability: Never true  . Transportation needs:    Medical: No    Non-medical: No  Tobacco Use  . Smoking status: Current Every Day Smoker    Packs/day: 1.00    Types: Cigarettes  . Smokeless tobacco: Never Used  Substance and Sexual Activity  . Alcohol use: No  . Drug use: Not on file  . Sexual activity: Not on file  Lifestyle  . Physical  activity:    Days per week: 6 days    Minutes per session: 40 min  . Stress: Not at all  Relationships  . Social connections:    Talks on phone: More than three times a week    Gets together: Three times a week    Attends religious service: Never    Active member of club or organization: No    Attends meetings of clubs or organizations: Never    Relationship status: Living with partner  . Intimate partner violence:    Fear of current or ex partner: No    Emotionally abused: No    Physically abused: No    Forced sexual activity: No  Other Topics Concern  . Not on file  Social History Narrative   Natalie Frey she has been doing ok and does not need any assistance          - is currently on food stamps but those have been enough       NO consent form signed     Outpatient Medications Prior to Visit  Medication Sig Dispense Refill  . acetaminophen (TYLENOL) 325 MG tablet Take 2 tablets (650 mg total) by mouth every 6 (six) hours as needed for mild pain (or Fever >/= 101).    . [  START ON 01/21/2019] atorvastatin (LIPITOR) 10 MG tablet Take 2 tablets (20 mg total) by mouth daily. 30 tablet 3  . ELIQUIS 5 MG TABS tablet TAKE ONE TABLET BY MOUTH 2 TIMES A DAY 180 tablet 0  . [START ON 01/21/2019] amLODipine (NORVASC) 5 MG tablet Take 0.5 tablets (2.5 mg total) by mouth daily. 30 tablet 0   No facility-administered medications prior to visit.     No Known Allergies  ROS Review of Systems  Constitutional: Negative.   HENT: Negative.   Eyes: Negative.   Respiratory: Negative.   Cardiovascular: Negative.   Gastrointestinal: Negative.   Genitourinary: Negative.   Musculoskeletal: Negative.   Skin: Negative.   Neurological: Negative.   Hematological: Negative.   Psychiatric/Behavioral: Negative.       Objective:    Physical Exam  Constitutional: She is oriented to person, place, and time. She appears well-developed and well-nourished.  HENT:  Head: Normocephalic and atraumatic.   Eyes: Pupils are equal, round, and reactive to light. EOM are normal.  Neck: Normal range of motion.  Cardiovascular: Normal rate and regular rhythm.  Pulmonary/Chest: Effort normal and breath sounds normal.  Abdominal: Soft. Bowel sounds are normal.  Musculoskeletal: Normal range of motion.  Neurological: She is alert and oriented to person, place, and time.  Skin: Skin is warm.  Psychiatric: She has a normal mood and affect. Her behavior is normal. Judgment and thought content normal.    BP 126/74 (BP Location: Left Arm, Patient Position: Sitting)   Pulse 84   Ht 5' 4"  (1.626 m)   Wt 180 lb 12.8 oz (82 kg)   SpO2 98%   BMI 31.03 kg/m  Wt Readings from Last 3 Encounters:  11/13/18 180 lb 12.8 oz (82 kg)  10/30/18 185 lb 9.6 oz (84.2 kg)  07/31/18 190 lb 14.4 oz (86.6 kg)   Natalie Frey, lost 5 pounds in 2 weeks, she was commended for making life style changes and she was encouraged to continue exercising 30 minutes daily and decreasing her caloric intake, low salt diet.  Health Maintenance Due  Topic Date Due  . Hepatitis C Screening  20-Sep-1956  . TETANUS/TDAP  09/02/1977  . PAP SMEAR-Modifier  09/03/1979  . MAMMOGRAM  09/02/2008  . COLONOSCOPY  09/02/2008  She reports that she will schedule Mammogram and Pap smear appointment, and will return her stool card for occult blood screening.  There are no preventive care reminders to display for this patient.  Lab Results  Component Value Date   TSH 1.840 04/17/2018   Lab Results  Component Value Date   WBC 8.8 10/22/2018   HGB 15.0 10/22/2018   HCT 44.0 10/22/2018   MCV 94 10/22/2018   PLT 264 10/22/2018   Lab Results  Component Value Date   NA 140 10/22/2018   K 4.4 10/22/2018   CO2 20 10/22/2018   GLUCOSE 135 (H) 10/22/2018   BUN 9 10/22/2018   CREATININE 0.66 10/22/2018   BILITOT 0.6 10/22/2018   ALKPHOS 68 10/22/2018   AST 16 10/22/2018   ALT 13 10/22/2018   PROT 7.0 10/22/2018   ALBUMIN 4.3 10/22/2018    CALCIUM 9.1 10/22/2018   ANIONGAP 10 12/24/2017   Lab Results  Component Value Date   CHOL 238 (H) 10/22/2018   Lab Results  Component Value Date   HDL 41 10/22/2018   Lab Results  Component Value Date   LDLCALC 161 (H) 10/22/2018   Lab Results  Component Value Date   TRIG  181 (H) 10/22/2018   Lab Results  Component Value Date   CHOLHDL 5.8 (H) 10/22/2018   She will continue to take 20 mg Atorvastatin daily and continue making dietary changes, will recheck Lipid panel in 3 months. Lab Results  Component Value Date   HGBA1C 6.6 (H) 10/22/2018   She was encouraged to continue making dietary changes and will recheck HgbA1c in 3 months.   Assessment & Plan:   Problem List Items Addressed This Visit      Cardiovascular and Mediastinum   DVT (deep venous thrombosis) ([place])     Other   Tobacco use disorder    Other Visit Diagnoses    Essential hypertension    -  Primary   Relevant Orders   CBC w/Diff   Comp Met (CMET)   HgB A1c   Mixed hyperlipidemia       Relevant Orders   Lipid panel      No orders of the defined types were placed in this encounter.  1. Essential hypertension - Blood pressure is within normal limits , her goal is < 150/90. She requested for the discontinuation of Amlodipine 2.5 mg and it was discontinued. She was encouraged to continue making life style changes, monitoring her blood pressure at home and notify provider of any changes. Will recheck the following labs in 3 months. - CBC w/Diff; Future - Comp Met (CMET); Future - HgB A1c; Future  2. Mixed hyperlipidemia - She will continue on 20 mg Atorvastatin daily and lipid panel will be rechecked in 3 months. She was encouraged to continue on low fat low cholesterol diet. - Lipid panel; Future  3. Deep vein thrombosis (DVT) of femoral vein of left lower extremity, unspecified chronicity ([place]) - She will continue on Eliquis 5 mg bid an was educated on medication side effects and was  encouraged to notify provider.  4. Tobacco use disorder - She was strongly advised on smoking cessation.  Follow-up: Return in about 3 months (around 02/11/2019), or if symptoms worsen or fail to improve.    Nesbit Michon Jerold Coombe, NP

## 2018-11-13 NOTE — Patient Instructions (Signed)
DASH Eating Plan  DASH stands for "Dietary Approaches to Stop Hypertension." The DASH eating plan is a healthy eating plan that has been shown to reduce high blood pressure (hypertension). It may also reduce your risk for type 2 diabetes, heart disease, and stroke. The DASH eating plan may also help with weight loss.  What are tips for following this plan?    General guidelines   Avoid eating more than 2,300 mg (milligrams) of salt (sodium) a day. If you have hypertension, you may need to reduce your sodium intake to 1,500 mg a day.   Limit alcohol intake to no more than 1 drink a day for nonpregnant women and 2 drinks a day for men. One drink equals 12 oz of beer, 5 oz of wine, or 1 oz of hard liquor.   Work with your health care provider to maintain a healthy body weight or to lose weight. Ask what an ideal weight is for you.   Get at least 30 minutes of exercise that causes your heart to beat faster (aerobic exercise) most days of the week. Activities may include walking, swimming, or biking.   Work with your health care provider or diet and nutrition specialist (dietitian) to adjust your eating plan to your individual calorie needs.  Reading food labels     Check food labels for the amount of sodium per serving. Choose foods with less than 5 percent of the Daily Value of sodium. Generally, foods with less than 300 mg of sodium per serving fit into this eating plan.   To find whole grains, look for the word "whole" as the first word in the ingredient list.  Shopping   Buy products labeled as "low-sodium" or "no salt added."   Buy fresh foods. Avoid canned foods and premade or frozen meals.  Cooking   Avoid adding salt when cooking. Use salt-free seasonings or herbs instead of table salt or sea salt. Check with your health care provider or pharmacist before using salt substitutes.   Do not fry foods. Cook foods using healthy methods such as baking, boiling, grilling, and broiling instead.   Cook with  heart-healthy oils, such as olive, canola, soybean, or sunflower oil.  Meal planning   Eat a balanced diet that includes:  ? 5 or more servings of fruits and vegetables each day. At each meal, try to fill half of your plate with fruits and vegetables.  ? Up to 6-8 servings of whole grains each day.  ? Less than 6 oz of lean meat, poultry, or fish each day. A 3-oz serving of meat is about the same size as a deck of cards. One egg equals 1 oz.  ? 2 servings of low-fat dairy each day.  ? A serving of nuts, seeds, or beans 5 times each week.  ? Heart-healthy fats. Healthy fats called Omega-3 fatty acids are found in foods such as flaxseeds and coldwater fish, like sardines, salmon, and mackerel.   Limit how much you eat of the following:  ? Canned or prepackaged foods.  ? Food that is high in trans fat, such as fried foods.  ? Food that is high in saturated fat, such as fatty meat.  ? Sweets, desserts, sugary drinks, and other foods with added sugar.  ? Full-fat dairy products.   Do not salt foods before eating.   Try to eat at least 2 vegetarian meals each week.   Eat more home-cooked food and less restaurant, buffet, and fast food.     When eating at a restaurant, ask that your food be prepared with less salt or no salt, if possible.  What foods are recommended?  The items listed may not be a complete list. Talk with your dietitian about what dietary choices are best for you.  Grains  Whole-grain or whole-wheat bread. Whole-grain or whole-wheat pasta. Brown rice. Oatmeal. Quinoa. Bulgur. Whole-grain and low-sodium cereals. Pita bread. Low-fat, low-sodium crackers. Whole-wheat flour tortillas.  Vegetables  Fresh or frozen vegetables (raw, steamed, roasted, or grilled). Low-sodium or reduced-sodium tomato and vegetable juice. Low-sodium or reduced-sodium tomato sauce and tomato paste. Low-sodium or reduced-sodium canned vegetables.  Fruits  All fresh, dried, or frozen fruit. Canned fruit in natural juice (without  added sugar).  Meat and other protein foods  Skinless chicken or turkey. Ground chicken or turkey. Pork with fat trimmed off. Fish and seafood. Egg whites. Dried beans, peas, or lentils. Unsalted nuts, nut butters, and seeds. Unsalted canned beans. Lean cuts of beef with fat trimmed off. Low-sodium, lean deli meat.  Dairy  Low-fat (1%) or fat-free (skim) milk. Fat-free, low-fat, or reduced-fat cheeses. Nonfat, low-sodium ricotta or cottage cheese. Low-fat or nonfat yogurt. Low-fat, low-sodium cheese.  Fats and oils  Soft margarine without trans fats. Vegetable oil. Low-fat, reduced-fat, or light mayonnaise and salad dressings (reduced-sodium). Canola, safflower, olive, soybean, and sunflower oils. Avocado.  Seasoning and other foods  Herbs. Spices. Seasoning mixes without salt. Unsalted popcorn and pretzels. Fat-free sweets.  What foods are not recommended?  The items listed may not be a complete list. Talk with your dietitian about what dietary choices are best for you.  Grains  Baked goods made with fat, such as croissants, muffins, or some breads. Dry pasta or rice meal packs.  Vegetables  Creamed or fried vegetables. Vegetables in a cheese sauce. Regular canned vegetables (not low-sodium or reduced-sodium). Regular canned tomato sauce and paste (not low-sodium or reduced-sodium). Regular tomato and vegetable juice (not low-sodium or reduced-sodium). Pickles. Olives.  Fruits  Canned fruit in a light or heavy syrup. Fried fruit. Fruit in cream or butter sauce.  Meat and other protein foods  Fatty cuts of meat. Ribs. Fried meat. Bacon. Sausage. Bologna and other processed lunch meats. Salami. Fatback. Hotdogs. Bratwurst. Salted nuts and seeds. Canned beans with added salt. Canned or smoked fish. Whole eggs or egg yolks. Chicken or turkey with skin.  Dairy  Whole or 2% milk, cream, and half-and-half. Whole or full-fat cream cheese. Whole-fat or sweetened yogurt. Full-fat cheese. Nondairy creamers. Whipped toppings.  Processed cheese and cheese spreads.  Fats and oils  Butter. Stick margarine. Lard. Shortening. Ghee. Bacon fat. Tropical oils, such as coconut, palm kernel, or palm oil.  Seasoning and other foods  Salted popcorn and pretzels. Onion salt, garlic salt, seasoned salt, table salt, and sea salt. Worcestershire sauce. Tartar sauce. Barbecue sauce. Teriyaki sauce. Soy sauce, including reduced-sodium. Steak sauce. Canned and packaged gravies. Fish sauce. Oyster sauce. Cocktail sauce. Horseradish that you find on the shelf. Ketchup. Mustard. Meat flavorings and tenderizers. Bouillon cubes. Hot sauce and Tabasco sauce. Premade or packaged marinades. Premade or packaged taco seasonings. Relishes. Regular salad dressings.  Where to find more information:   National Heart, Lung, and Blood Institute: www.nhlbi.nih.gov   American Heart Association: www.heart.org  Summary   The DASH eating plan is a healthy eating plan that has been shown to reduce high blood pressure (hypertension). It may also reduce your risk for type 2 diabetes, heart disease, and stroke.   With the   DASH eating plan, you should limit salt (sodium) intake to 2,300 mg a day. If you have hypertension, you may need to reduce your sodium intake to 1,500 mg a day.   When on the DASH eating plan, aim to eat more fresh fruits and vegetables, whole grains, lean proteins, low-fat dairy, and heart-healthy fats.   Work with your health care provider or diet and nutrition specialist (dietitian) to adjust your eating plan to your individual calorie needs.  This information is not intended to replace advice given to you by your health care provider. Make sure you discuss any questions you have with your health care provider.  Document Released: 09/06/2011 Document Revised: 09/10/2016 Document Reviewed: 09/10/2016  Elsevier Interactive Patient Education  2019 [employer] Inc[employer]

## 2018-12-02 ENCOUNTER — Telehealth: Payer: Self-pay | Admitting: Pharmacist

## 2018-12-02 NOTE — Telephone Encounter (Signed)
12/02/2018 2:42:12 PM - Eliquis  12/02/2018 Faxed [employer] renewal application for Eliquis 5mg  Take 1 tablet two times a day.Delos Haring

## 2018-12-08 ENCOUNTER — Telehealth: Payer: Self-pay | Admitting: Pharmacist

## 2018-12-08 NOTE — Telephone Encounter (Signed)
12/08/2018 2:13:21 PM - Eliquis  12/08/2018 Received letter from [employer] stating they have received the application, will review and notify us when eligibility is determined.Natalie Frey

## 2018-12-09 ENCOUNTER — Telehealth: Payer: Self-pay | Admitting: Pharmacist

## 2018-12-09 NOTE — Telephone Encounter (Signed)
12/09/2018 12:09:36 PM - Eliquis/[employer] approval  12/09/2018 I have received fax from [facility] stating patient approved for Eliquis till 12/08/2019, Case# [id number].Delos Haring

## 2018-12-29 ENCOUNTER — Telehealth: Payer: Self-pay | Admitting: Pharmacy Technician

## 2018-12-29 NOTE — Telephone Encounter (Signed)
Received 2020 proof of income.  Patient eligible to receive medication assistance at [facility] as long as eligibility requirements continue to be met.  [facility]

## 2019-01-28 ENCOUNTER — Other Ambulatory Visit: Payer: Self-pay

## 2019-02-05 ENCOUNTER — Ambulatory Visit: Payer: Self-pay | Admitting: Gerontology

## 2019-03-11 ENCOUNTER — Other Ambulatory Visit: Payer: Self-pay

## 2019-03-11 DIAGNOSIS — I10 Essential (primary) hypertension: Secondary | ICD-10-CM

## 2019-03-11 DIAGNOSIS — E782 Mixed hyperlipidemia: Secondary | ICD-10-CM

## 2019-03-12 ENCOUNTER — Other Ambulatory Visit: Payer: Self-pay | Admitting: Gerontology

## 2019-03-12 ENCOUNTER — Other Ambulatory Visit: Payer: Self-pay

## 2019-03-12 ENCOUNTER — Other Ambulatory Visit: Payer: Self-pay | Admitting: Internal Medicine

## 2019-03-12 DIAGNOSIS — E782 Mixed hyperlipidemia: Secondary | ICD-10-CM

## 2019-03-12 DIAGNOSIS — I82412 Acute embolism and thrombosis of left femoral vein: Secondary | ICD-10-CM

## 2019-03-12 DIAGNOSIS — R030 Elevated blood-pressure reading, without diagnosis of hypertension: Secondary | ICD-10-CM

## 2019-03-12 LAB — COMPREHENSIVE METABOLIC PANEL
ALT: 17 IU/L (ref 0–32)
AST: 16 IU/L (ref 0–40)
Albumin/Globulin Ratio: 1.6 (ref 1.2–2.2)
Albumin: 4.5 g/dL (ref 3.8–4.9)
Alkaline Phosphatase: 73 IU/L (ref 39–117)
BUN/Creatinine Ratio: 18 (ref 12–28)
BUN: 11 mg/dL (ref 8–27)
Bilirubin Total: 1 mg/dL (ref 0.0–1.2)
CO2: 17 mmol/L — ABNORMAL LOW (ref 20–29)
Calcium: 9.4 mg/dL (ref 8.7–10.3)
Chloride: 107 mmol/L — ABNORMAL HIGH (ref 96–106)
Creatinine, Ser: 0.61 mg/dL (ref 0.57–1.00)
GFR calc Af Amer: 114 mL/min/{1.73_m2} (ref >59)
GFR calc non Af Amer: 99 mL/min/{1.73_m2} (ref >59)
Globulin, Total: 2.8 g/dL (ref 1.5–4.5)
Glucose: 124 mg/dL — ABNORMAL HIGH (ref 65–99)
Potassium: 4.5 mmol/L (ref 3.5–5.2)
Sodium: 140 mmol/L (ref 134–144)
Total Protein: 7.3 g/dL (ref 6.0–8.5)

## 2019-03-12 LAB — CBC WITH DIFFERENTIAL/PLATELET
Basophils Absolute: 0 10*3/uL (ref 0.0–0.2)
Basos: 0 %
EOS (ABSOLUTE): 0.1 10*3/uL (ref 0.0–0.4)
Eos: 1 %
Hematocrit: 47.1 % — ABNORMAL HIGH (ref 34.0–46.6)
Hemoglobin: 15.9 g/dL (ref 11.1–15.9)
Immature Grans (Abs): 0 10*3/uL (ref 0.0–0.1)
Immature Granulocytes: 0 %
Lymphocytes Absolute: 3.6 10*3/uL — ABNORMAL HIGH (ref 0.7–3.1)
Lymphs: 34 %
MCH: 32.1 pg (ref 26.6–33.0)
MCHC: 33.8 g/dL (ref 31.5–35.7)
MCV: 95 fL (ref 79–97)
Monocytes Absolute: 0.5 10*3/uL (ref 0.1–0.9)
Monocytes: 5 %
Neutrophils Absolute: 6.4 10*3/uL (ref 1.4–7.0)
Neutrophils: 60 %
Platelets: 270 10*3/uL (ref 150–450)
RBC: 4.95 x10E6/uL (ref 3.77–5.28)
RDW: 12.9 % (ref 11.7–15.4)
WBC: 10.7 10*3/uL (ref 3.4–10.8)

## 2019-03-12 LAB — LIPID PANEL
Chol/HDL Ratio: 4.6 ratio — ABNORMAL HIGH (ref 0.0–4.4)
Cholesterol, Total: 165 mg/dL (ref 100–199)
HDL: 36 mg/dL — ABNORMAL LOW (ref >39)
LDL Calculated: 91 mg/dL (ref 0–99)
Triglycerides: 188 mg/dL — ABNORMAL HIGH (ref 0–149)
VLDL Cholesterol Cal: 38 mg/dL (ref 5–40)

## 2019-03-12 LAB — HEMOGLOBIN A1C
Est. average glucose Bld gHb Est-mCnc: 143 mg/dL
Hgb A1c MFr Bld: 6.6 % — ABNORMAL HIGH (ref 4.8–5.6)

## 2019-03-12 MED ORDER — APIXABAN 5 MG PO TABS: ORAL_TABLET | ORAL | 0 refills | Status: DC

## 2019-03-17 ENCOUNTER — Other Ambulatory Visit: Payer: Self-pay

## 2019-03-17 ENCOUNTER — Ambulatory Visit: Payer: Self-pay | Admitting: Gerontology

## 2019-03-17 ENCOUNTER — Encounter: Payer: Self-pay | Admitting: Gerontology

## 2019-03-17 DIAGNOSIS — E119 Type 2 diabetes mellitus without complications: Secondary | ICD-10-CM

## 2019-03-17 DIAGNOSIS — R899 Unspecified abnormal finding in specimens from other organs, systems and tissues: Secondary | ICD-10-CM

## 2019-03-17 DIAGNOSIS — I82412 Acute embolism and thrombosis of left femoral vein: Secondary | ICD-10-CM

## 2019-03-17 DIAGNOSIS — E782 Mixed hyperlipidemia: Secondary | ICD-10-CM

## 2019-03-17 DIAGNOSIS — M7989 Other specified soft tissue disorders: Secondary | ICD-10-CM

## 2019-03-17 DIAGNOSIS — E785 Hyperlipidemia, unspecified: Secondary | ICD-10-CM

## 2019-03-17 HISTORY — DX: Type 2 diabetes mellitus without complications: E11.9

## 2019-03-17 MED ORDER — ATORVASTATIN CALCIUM 20 MG PO TABS: ORAL_TABLET | ORAL | 0 refills | Status: DC

## 2019-03-17 NOTE — Progress Notes (Signed)
Established Patient Office Visit  Subjective:  Patient ID: Natalie Frey, female    DOB: 04/24/1956  Age: 63 y.o. MRN: [id number]  CC:  Chief Complaint  Patient presents with  . Follow-up    high blood pressure   Patient consents to telephone visit and 2 patient identifiers was used to identify patient. HPI [name] presents for follow up hyperlipidemia, type 2 diabetes, DVT and lab review. She continues to take 20 mg Atorvastatin and her Lipid panel done on 03/11/19 showed  Triglycerides 188 mg/dl, HDL-36 and LDL 91 mg /dl, her LDL has improved and she denies myalgia, weakness and continues to make lifestyle modifications. She reports that she checks her blood pressure at home and it's normal, but she continues to smoke 1 pack of cigarette and admits the desire to quit. Her HgbA1c done on 03/11/19 was 6.6% and she agreed to work on her diet before starting any medication. She denies hypoglycemic or hyperglycemic symptoms. She continues on Eliquis 5 mg bid for DVT, she denies bruises, hematuria, hematochezia and claudication. She reports that she experiences intermittent mild edema to lower extremities after standing for many hours, but it resolves with elevating the legs.She denies chest pain, palpitation, fever, chills and no further concern.  Past Medical History:  Diagnosis Date  . Cancer ([place])   . Hyperlipidemia     Past Surgical History:  Procedure Laterality Date  . BLADDER SURGERY    . DILATION AND EVACUATION      Family History  Problem Relation Age of Onset  . Deep vein thrombosis Daughter     Social History   Socioeconomic History  . Marital status: Widowed    Spouse name: Not on file  . Number of children: Not on file  . Years of education: Not on file  . Highest education level: Not on file  Occupational History  . Not on file  Social Needs  . Financial resource strain: Not hard at all  . Food insecurity    Worry: Never true    Inability: Never true  .  Transportation needs    Medical: No    Non-medical: No  Tobacco Use  . Smoking status: Current Every Day Smoker    Packs/day: 1.00    Types: Cigarettes  . Smokeless tobacco: Never Used  Substance and Sexual Activity  . Alcohol use: No  . Drug use: Yes    Types: Marijuana  . Sexual activity: Not on file  Lifestyle  . Physical activity    Days per week: 6 days    Minutes per session: 40 min  . Stress: Not at all  Relationships  . Social connections    Talks on phone: More than three times a week    Gets together: Three times a week    Attends religious service: Never    Active member of club or organization: No    Attends meetings of clubs or organizations: Never    Relationship status: Living with partner  . Intimate partner violence    Fear of current or ex partner: No    Emotionally abused: No    Physically abused: No    Forced sexual activity: No  Other Topics Concern  . Not on file  Social History Narrative   Natalie Frey she has been doing ok and does not need any assistance          - is currently on food stamps but those have been enough  NO consent form signed     Outpatient Medications Prior to Visit  Medication Sig Dispense Refill  . acetaminophen (TYLENOL) 325 MG tablet Take 2 tablets (650 mg total) by mouth every 6 (six) hours as needed for mild pain (or Fever >/= 101).    Mar[facility] Kitchen apixaban (ELIQUIS) 5 MG TABS tablet TAKE ONE TABLET BY MOUTH 2 TIMES A DAY 60 tablet 0  . atorvastatin (LIPITOR) 20 MG tablet TAKE 1 TABLET (20 MG) BY MOUTH EVERY DAY 90 tablet 0   No facility-administered medications prior to visit.     No Known Allergies  ROS Review of Systems  Constitutional: Negative.   Respiratory: Negative.   Cardiovascular: Positive for leg swelling (intermittent puffiness to legs).  Gastrointestinal: Negative.   Genitourinary: Negative.   Musculoskeletal: Negative.   Skin: Negative.   Neurological: Negative.   Hematological: Negative.    Psychiatric/Behavioral: Negative.       Objective:    Physical Exam No vital sign or PE was done There were no vitals taken for this visit. Wt Readings from Last 3 Encounters:  11/13/18 180 lb 12.8 oz (82 kg)  10/30/18 185 lb 9.6 oz (84.2 kg)  07/31/18 190 lb 14.4 oz (86.6 kg)     Health Maintenance Due  Topic Date Due  . Hepatitis C Screening  17-Apr-1956  . TETANUS/TDAP  09/02/1977  . PAP SMEAR-Modifier  09/03/1979  . MAMMOGRAM  09/02/2008  . COLONOSCOPY  09/02/2008    There are no preventive care reminders to display for this patient.  Lab Results  Component Value Date   TSH 1.840 04/17/2018   Lab Results  Component Value Date   WBC 10.7 03/11/2019   HGB 15.9 03/11/2019   HCT 47.1 (H) 03/11/2019   MCV 95 03/11/2019   PLT 270 03/11/2019   Lab Results  Component Value Date   NA 140 03/11/2019   K 4.5 03/11/2019   CO2 17 (L) 03/11/2019   GLUCOSE 124 (H) 03/11/2019   BUN 11 03/11/2019   CREATININE 0.61 03/11/2019   BILITOT 1.0 03/11/2019   ALKPHOS 73 03/11/2019   AST 16 03/11/2019   ALT 17 03/11/2019   PROT 7.3 03/11/2019   ALBUMIN 4.5 03/11/2019   CALCIUM 9.4 03/11/2019   ANIONGAP 10 12/24/2017   Lab Results  Component Value Date   CHOL 165 03/11/2019   Lab Results  Component Value Date   HDL 36 (L) 03/11/2019   Lab Results  Component Value Date   LDLCALC 91 03/11/2019   Lab Results  Component Value Date   TRIG 188 (H) 03/11/2019   Lab Results  Component Value Date   CHOLHDL 4.6 (H) 03/11/2019   Lab Results  Component Value Date   HGBA1C 6.6 (H) 03/11/2019      Assessment & Plan:     1. Acute deep vein thrombosis (DVT) of femoral vein of left lower extremity ([place]) - She will continue on current treatment regimen and advised to go to the ED with active bleeding. - She was advised to complete charity care application for Oncology referral for COAG workup.  2. Mixed hyperlipidemia - She will continue on current treatment  regimen. - atorvastatin (LIPITOR) 20 MG tablet; TAKE 1 TABLET (20 MG) BY MOUTH EVERY DAY  Dispense: 90 tablet; Refill: 0 - Lipid panel; Future - HgB A1c; Future -Low fat Diet, like low fat dairy products eg skimmed milk -Avoid any fried food -Regular exercise/walk -Goal for Total Cholesterol is less than 200 -Goal for bad  cholesterol LDL is less than 100 -Goal for Good cholesterol HDL is more than 45 -Goal for Triglyceride is less than 150    3. Leg swelling - She was advised to elevate leg and notify clinic or go to the ED with worsening peripheral edema or claudication.  4. Type 2 Diabetes without complications - EQAS3M will be rechecked in 3 months and she was advised to continue on low carbohydrate and concentrated sugar diet, exercise as tolerated.   Follow-up: Return in about 3 months (around 06/17/2019), or if symptoms worsen or fail to improve.    Herby Amick Jerold Coombe, NP

## 2019-03-18 ENCOUNTER — Other Ambulatory Visit: Payer: Self-pay | Admitting: Gerontology

## 2019-03-18 DIAGNOSIS — E119 Type 2 diabetes mellitus without complications: Secondary | ICD-10-CM

## 2019-03-18 MED ORDER — METFORMIN HCL 1000 MG PO TABS: 500.0000 mg | ORAL_TABLET | Freq: Every day | ORAL | 0 refills | Status: DC

## 2019-03-26 ENCOUNTER — Inpatient Hospital Stay: Payer: Self-pay | Attending: Internal Medicine | Admitting: Internal Medicine

## 2019-03-26 ENCOUNTER — Encounter: Payer: Self-pay | Admitting: Internal Medicine

## 2019-03-26 ENCOUNTER — Other Ambulatory Visit: Payer: Self-pay

## 2019-03-26 DIAGNOSIS — I82512 Chronic embolism and thrombosis of left femoral vein: Secondary | ICD-10-CM

## 2019-03-26 DIAGNOSIS — Z7901 Long term (current) use of anticoagulants: Secondary | ICD-10-CM

## 2019-03-26 DIAGNOSIS — F17210 Nicotine dependence, cigarettes, uncomplicated: Secondary | ICD-10-CM

## 2019-03-26 DIAGNOSIS — I82412 Acute embolism and thrombosis of left femoral vein: Secondary | ICD-10-CM | POA: Insufficient documentation

## 2019-03-26 NOTE — Progress Notes (Signed)
I connected with Natalie Frey on 03/26/19 at 10:00 AM EDT by video enabled telemedicine visit and verified that I am speaking with the correct person using two identifiers.  I discussed the limitations, risks, security and privacy concerns of performing an evaluation and management service by telemedicine and the availability of in-person appointments. I also discussed with the patient that there may be a patient responsible charge related to this service. The patient expressed understanding and agreed to proceed.    Other persons participating in the visit and their role in the encounter: RN/medical reconcilation  Patient's location:home Provider's location: Home   Oncology History   No history exists.   # MARCH 2019 DVT of the left lower extremity [femoral /popliteal/tibial] -[facility]oked [facility]  Chief Complaint: Left lower DVT    History of present illness:Natalie Frey 63 y.o.  female with history of active smoking; and also history of DVT of the left lower extremity in March 2019 has been referred to [place] for further evaluation recommendations.  Patient denies any long distance travel or immobilization prior to her DVT.  She denies any prior history of DVT or PE.  She had not been on any hormonal therapies.  She unfortunately smokes and also gives a family history of PE in in 2 of her sisters.  Patient denies any bleeding or easy bruising on Eliquis.  However she is interested in coming off Eliquis.  Observation/objective: None.  Assessment and plan: Acute deep vein thrombosis (DVT) of femoral vein of left lower extremity (HCC) #   Chronic deep vein thrombosis (DVT) of femoral vein of left lower extremity (HCC) #Extensive DVT of the left femoral popliteal tibial vein-March 2019.  Unprovoked.  Patient has been on anticoagulation with Eliquis for more than 1 year.  Interested in coming off anticoagulation.  Recommend hypercoagulable work-up/also recommend ultrasound of the  left lower extremity.   #Active smoker-Discussed with the patient regarding the ill effects of smoking- including but not limited to cardiac lung and vascular diseases and malignancies. Counseled against smoking-especially risk factor for her DVT.  Will discuss further regarding lung cancer screening program at next visit.  # DISPOSITION: # [place] Left LE in 2 week # labs in 2 week [ordered] # follow up in 1 month-MD; no labs-Dr.B  Thank you for allowing me to participate in the care of your pleasant patient. Please do not hesitate to contact me with questions or concerns in the interim.   Follow-up instructions:  I discussed the assessment and treatment plan with the patient.  The patient was provided an opportunity to ask questions and all were answered.  The patient agreed with the plan and demonstrated understanding of instructions.  The patient was advised to call back or seek an in person evaluation if the symptoms worsen or if the condition fails to improve as anticipated.  Dr. Charlaine Dalton Talty at [facility] 03/26/2019 10:23 AM

## 2019-03-26 NOTE — Assessment & Plan Note (Addendum)
#  Extensive DVT of the left femoral popliteal tibial vein-March 2019.  Unprovoked.  Patient has been on anticoagulation with Eliquis for more than 1 year.  Interested in coming off anticoagulation.  Recommend hypercoagulable work-up/also recommend ultrasound of the left lower extremity.   #Active smoker-Discussed with the patient regarding the ill effects of smoking- including but not limited to cardiac lung and vascular diseases and malignancies. Counseled against smoking-especially risk factor for her DVT.  Will discuss further regarding lung cancer screening program at next visit.  # DISPOSITION: # [place] Left LE in 2 week # labs in 2 week [ordered] # follow up in 1 month-MD; no labs-Dr.B  Thank you for allowing me to participate in the care of your pleasant patient. Please do not hesitate to contact me with questions or concerns in the interim.

## 2019-03-31 ENCOUNTER — Ambulatory Visit: Payer: Self-pay | Admitting: Gerontology

## 2019-03-31 ENCOUNTER — Other Ambulatory Visit: Payer: Self-pay

## 2019-03-31 DIAGNOSIS — E782 Mixed hyperlipidemia: Secondary | ICD-10-CM

## 2019-03-31 DIAGNOSIS — Z0000 Encounter for general adult medical examination without abnormal findings: Secondary | ICD-10-CM

## 2019-03-31 DIAGNOSIS — E119 Type 2 diabetes mellitus without complications: Secondary | ICD-10-CM

## 2019-03-31 DIAGNOSIS — I82412 Acute embolism and thrombosis of left femoral vein: Secondary | ICD-10-CM

## 2019-03-31 MED ORDER — METFORMIN HCL 1000 MG PO TABS: 1000.0000 mg | ORAL_TABLET | Freq: Every day | ORAL | 2 refills | Status: DC

## 2019-03-31 NOTE — Progress Notes (Signed)
Established Patient Office Visit  Subjective:  Patient ID: Natalie Frey, female    DOB: 30-Dec-1955  Age: 63 y.o. MRN: [id number]  CC:  Chief Complaint  Patient presents with  . Follow-up   Patient consents to telephone visit and 2 patient identifiers was used to identify patient. HPI Natalie Frey presents for follow up of hyperlipidemia,T2DM and DVT. She reports that she's tolerating metformin, and is compliant with all her medication and making life style changes though she continues to smoke 1 pack of cigarette daily. She denies myalgia, motor weakness, diarrhea, hematuria and hematochezia. She was evaluated to determine if she will continue 5 mg  Eliquis bid for  DVT of femoral vein on 03/26/19 by Dr Rogue Bussing. He recommends hypercoagulable work-up and ultrasound to left lower extremity. She denies chest pain, palpitation, fever, chills and no further concerns.  Past Medical History:  Diagnosis Date  . Cancer ([place])   . Hyperlipidemia     Past Surgical History:  Procedure Laterality Date  . BLADDER SURGERY    . DILATION AND EVACUATION      Family History  Problem Relation Age of Onset  . Deep vein thrombosis Daughter     Social History   Socioeconomic History  . Marital status: Widowed    Spouse name: Not on file  . Number of children: Not on file  . Years of education: Not on file  . Highest education level: Not on file  Occupational History  . Not on file  Social Needs  . Financial resource strain: Not hard at all  . Food insecurity    Worry: Never true    Inability: Never true  . Transportation needs    Medical: No    Non-medical: No  Tobacco Use  . Smoking status: Current Every Day Smoker    Packs/day: 1.00    Types: Cigarettes  . Smokeless tobacco: Never Used  Substance and Sexual Activity  . Alcohol use: No  . Drug use: Yes    Types: Marijuana  . Sexual activity: Not on file  Lifestyle  . Physical activity    Days per week: 6 days    Minutes per  session: 40 min  . Stress: Not at all  Relationships  . Social connections    Talks on phone: More than three times a week    Gets together: Three times a week    Attends religious service: Never    Active member of club or organization: No    Attends meetings of clubs or organizations: Never    Relationship status: Living with partner  . Intimate partner violence    Fear of current or ex partner: No    Emotionally abused: No    Physically abused: No    Forced sexual activity: No  Other Topics Concern  . Not on file  Social History Narrative   [name] she has been doing ok and does not need any assistance          - is currently on food stamps but those have been enough       NO consent form signed     Outpatient Medications Prior to Visit  Medication Sig Dispense Refill  . acetaminophen (TYLENOL) 325 MG tablet Take 2 tablets (650 mg total) by mouth every 6 (six) hours as needed for mild pain (or Fever >/= 101).    Marland Kitchen amphetamine-dextroamphetamine (ADDERALL) 30 MG tablet Take 1 tablet by mouth daily.    Marland Kitchen apixaban (ELIQUIS) 5 MG  TABS tablet TAKE ONE TABLET BY MOUTH 2 TIMES A DAY 60 tablet 0  . atorvastatin (LIPITOR) 20 MG tablet TAKE 1 TABLET (20 MG) BY MOUTH EVERY DAY 90 tablet 0  . metFORMIN (GLUCOPHAGE) 1000 MG tablet Take 0.5 tablets (500 mg total) by mouth daily. (Patient taking differently: Take 1,000 mg by mouth daily. ) 30 tablet 0   No facility-administered medications prior to visit.     No Known Allergies  ROS Review of Systems  Constitutional: Negative.   Respiratory: Negative.   Cardiovascular: Negative.   Gastrointestinal: Negative.   Genitourinary: Negative.   Musculoskeletal: Negative.   Skin: Negative.   Neurological: Negative.   Hematological: Negative.   Psychiatric/Behavioral: Negative.       Objective:    Physical Exam No vital sign or PE was done There were no vitals taken for this visit. Wt Readings from Last 3 Encounters:  11/13/18 180  lb 12.8 oz (82 kg)  10/30/18 185 lb 9.6 oz (84.2 kg)  07/31/18 190 lb 14.4 oz (86.6 kg)     Health Maintenance Due  Topic Date Due  . Hepatitis C Screening  May 14, 1956  . PNEUMOCOCCAL POLYSACCHARIDE VACCINE AGE 30-64 HIGH RISK  09/02/1960  . FOOT EXAM  09/02/1968  . OPHTHALMOLOGY EXAM  09/02/1968  . URINE MICROALBUMIN  09/02/1968  . TETANUS/TDAP  09/02/1977  . PAP SMEAR-Modifier  09/03/1979  . MAMMOGRAM  09/02/2008  . COLONOSCOPY  09/02/2008    There are no preventive care reminders to display for this patient.  Lab Results  Component Value Date   TSH 1.840 04/17/2018   Lab Results  Component Value Date   WBC 10.7 03/11/2019   HGB 15.9 03/11/2019   HCT 47.1 (H) 03/11/2019   MCV 95 03/11/2019   PLT 270 03/11/2019   Lab Results  Component Value Date   NA 140 03/11/2019   K 4.5 03/11/2019   CO2 17 (L) 03/11/2019   GLUCOSE 124 (H) 03/11/2019   BUN 11 03/11/2019   CREATININE 0.61 03/11/2019   BILITOT 1.0 03/11/2019   ALKPHOS 73 03/11/2019   AST 16 03/11/2019   ALT 17 03/11/2019   PROT 7.3 03/11/2019   ALBUMIN 4.5 03/11/2019   CALCIUM 9.4 03/11/2019   ANIONGAP 10 12/24/2017   Lab Results  Component Value Date   CHOL 165 03/11/2019   Lab Results  Component Value Date   HDL 36 (L) 03/11/2019   Lab Results  Component Value Date   LDLCALC 91 03/11/2019   Lab Results  Component Value Date   TRIG 188 (H) 03/11/2019   Lab Results  Component Value Date   CHOLHDL 4.6 (H) 03/11/2019   Lab Results  Component Value Date   HGBA1C 6.6 (H) 03/11/2019      Assessment & Plan:     1. Type 2 diabetes mellitus without complication, without long-term current use of insulin (HCC) Her HgbA1c was 6.6%, she will continue on current treatment regimen. -Use Diabetic diet as advised -Regular exercise - metFORMIN (GLUCOPHAGE) 1000 MG tablet; Take 1 tablet (1,000 mg total) by mouth daily.  Dispense: 30 tablet; Refill: 2 - HgB A1c; Future - Urinalysis; Future - Urine  Microalbumin w/creat. ratio; Future  2. Mixed hyperlipidemia - She will continue on current treatment regimen. --Low fat Diet, like low fat dairy products eg skimmed milk -Avoid any fried food -Regular exercise/walk -Goal for Total Cholesterol is less than 200 -Goal for bad cholesterol LDL is less than 70 -Goal for Good cholesterol HDL is  more than 45 -Goal for Triglyceride is less than 150 - Lipid panel; Future  3. Healthcare maintenance -She was advised to call and schedule appointment for mammogram and pap smear. - MM Digital Screening; Future - Cytology - PAP( [facility])  4. Deep vein thrombosis (DVT) of femoral vein of left lower extremity, unspecified chronicity ([place]) - She will continue on 5 mg Eliquis bid and continue to follow up with Dr Rogue Bussing.   Follow-up: Return in about 3 weeks (around 04/21/2019), or if symptoms worsen or fail to improve.    Eudell Julian Jerold Coombe, NP

## 2019-04-09 ENCOUNTER — Other Ambulatory Visit: Payer: Self-pay

## 2019-04-09 ENCOUNTER — Ambulatory Visit
Admission: RE | Admit: 2019-04-09 | Discharge: 2019-04-09 | Disposition: A | Discharge status: Home / Self Care | Payer: Self-pay | Source: Ambulatory Visit | Attending: Internal Medicine | Admitting: Internal Medicine

## 2019-04-09 ENCOUNTER — Inpatient Hospital Stay: Payer: Self-pay | Attending: Internal Medicine

## 2019-04-09 DIAGNOSIS — R112 Nausea with vomiting, unspecified: Secondary | ICD-10-CM | POA: Insufficient documentation

## 2019-04-09 DIAGNOSIS — F17210 Nicotine dependence, cigarettes, uncomplicated: Secondary | ICD-10-CM | POA: Insufficient documentation

## 2019-04-09 DIAGNOSIS — Z7901 Long term (current) use of anticoagulants: Secondary | ICD-10-CM | POA: Insufficient documentation

## 2019-04-09 DIAGNOSIS — I82512 Chronic embolism and thrombosis of left femoral vein: Secondary | ICD-10-CM | POA: Insufficient documentation

## 2019-04-09 DIAGNOSIS — R197 Diarrhea, unspecified: Secondary | ICD-10-CM | POA: Insufficient documentation

## 2019-04-09 LAB — CBC WITH DIFFERENTIAL/PLATELET
Abs Immature Granulocytes: 0.02 10*3/uL (ref 0.00–0.07)
Basophils Absolute: 0 10*3/uL (ref 0.0–0.1)
Basophils Relative: 0 %
Eosinophils Absolute: 0.1 10*3/uL (ref 0.0–0.5)
Eosinophils Relative: 1 %
HCT: 44.5 % (ref 36.0–46.0)
Hemoglobin: 14.8 g/dL (ref 12.0–15.0)
Immature Granulocytes: 0 %
Lymphocytes Relative: 31 %
Lymphs Abs: 2.8 10*3/uL (ref 0.7–4.0)
MCH: 31.4 pg (ref 26.0–34.0)
MCHC: 33.3 g/dL (ref 30.0–36.0)
MCV: 94.5 fL (ref 80.0–100.0)
Monocytes Absolute: 0.5 10*3/uL (ref 0.1–1.0)
Monocytes Relative: 5 %
Neutro Abs: 5.6 10*3/uL (ref 1.7–7.7)
Neutrophils Relative %: 63 %
Platelets: 281 10*3/uL (ref 150–400)
RBC: 4.71 MIL/uL (ref 3.87–5.11)
RDW: 12.7 % (ref 11.5–15.5)
WBC: 9 10*3/uL (ref 4.0–10.5)
nRBC: 0 % (ref 0.0–0.2)

## 2019-04-09 LAB — COMPREHENSIVE METABOLIC PANEL
ALT: 16 U/L (ref 0–44)
AST: 19 U/L (ref 15–41)
Albumin: 4.1 g/dL (ref 3.5–5.0)
Alkaline Phosphatase: 63 U/L (ref 38–126)
Anion gap: 9 (ref 5–15)
BUN: 12 mg/dL (ref 6–20)
CO2: 22 mmol/L (ref 22–32)
Calcium: 8.9 mg/dL (ref 8.9–10.3)
Chloride: 106 mmol/L (ref 98–111)
Creatinine, Ser: 0.54 mg/dL (ref 0.44–1.00)
GFR calc Af Amer: >60 mL/min (ref >60)
GFR calc non Af Amer: >60 mL/min (ref >60)
Glucose, Bld: 117 mg/dL — ABNORMAL HIGH (ref 70–99)
Potassium: 4.2 mmol/L (ref 3.5–5.1)
Sodium: 137 mmol/L (ref 135–145)
Total Bilirubin: 0.8 mg/dL (ref 0.3–1.2)
Total Protein: 7.6 g/dL (ref 6.5–8.1)

## 2019-04-09 LAB — FIBRIN DERIVATIVES D-DIMER (ARMC ONLY): Fibrin derivatives D-dimer (ARMC): 716.54 ng/mL (FEU) — ABNORMAL HIGH (ref 0.00–499.00)

## 2019-04-09 LAB — ANTITHROMBIN III: AntiThromb III Func: 114 % (ref 75–120)

## 2019-04-11 LAB — PROTEIN S, TOTAL: Protein S Ag, Total: 98 % (ref 60–150)

## 2019-04-12 LAB — CARDIOLIPIN ANTIBODIES, IGG, IGM, IGA
Anticardiolipin IgA: <9 APL U/mL (ref 0–11)
Anticardiolipin IgG: 9 GPL U/mL (ref 0–14)
Anticardiolipin IgM: <9 MPL U/mL (ref 0–12)

## 2019-04-13 LAB — BETA-2-GLYCOPROTEIN I ABS, IGG/M/A
Beta-2 Glyco I IgG: <9 GPI IgG units (ref 0–20)
Beta-2-Glycoprotein I IgA: <9 GPI IgA units (ref 0–25)
Beta-2-Glycoprotein I IgM: <9 GPI IgM units (ref 0–32)

## 2019-04-13 LAB — PROTEIN C, TOTAL: Protein C, Total: 119 % (ref 60–150)

## 2019-04-15 LAB — PROTHROMBIN GENE MUTATION

## 2019-04-20 LAB — FACTOR 5 LEIDEN

## 2019-04-23 ENCOUNTER — Other Ambulatory Visit: Payer: Self-pay

## 2019-04-23 ENCOUNTER — Inpatient Hospital Stay (HOSPITAL_BASED_OUTPATIENT_CLINIC_OR_DEPARTMENT_OTHER): Payer: Self-pay | Admitting: Internal Medicine

## 2019-04-23 ENCOUNTER — Encounter: Payer: Self-pay | Admitting: Internal Medicine

## 2019-04-23 DIAGNOSIS — F17210 Nicotine dependence, cigarettes, uncomplicated: Secondary | ICD-10-CM

## 2019-04-23 DIAGNOSIS — I82512 Chronic embolism and thrombosis of left femoral vein: Secondary | ICD-10-CM

## 2019-04-23 DIAGNOSIS — R197 Diarrhea, unspecified: Secondary | ICD-10-CM

## 2019-04-23 DIAGNOSIS — R112 Nausea with vomiting, unspecified: Secondary | ICD-10-CM

## 2019-04-23 DIAGNOSIS — Z7901 Long term (current) use of anticoagulants: Secondary | ICD-10-CM

## 2019-04-23 NOTE — Assessment & Plan Note (Addendum)
#  March 2019-extensive DVT of the left lower extremity-above and below knee.  Extensive hypercoagulable work-up-today is negative.  July 2020 ultrasound shows patent veins with suggestion of chronic DVT.  #Discussed with the patient that she has likely unprovoked DVT and she has been on anticoagulation for more than a year now.  I think is reasonable to stop anticoagulation.  #I discussed the potential possibility of recurrence of DVT/PE.  If unfortunately if this happen patient will need lifelong anticoagulation.  #Abdominal cramps diarrhea nausea vomiting-quit related to metformin.  Currently improved off metformin.  Patient's random blood sugars of 117 today.  I have asked the patient to talk to her PCP regarding discontinuation of metformin.  #Discussed DVT prophylaxis during travels [from [place]]-Eliquis for about 3 days around the days of travel.  # Discussed with the patient regarding the ill effects of smoking- including but not limited to cardiac lung and vascular diseases and malignancies. Counseled against smoking.  Patient is interested.  She is in the process of cutting down on smoking.  Also discussed that smoking is a significant risk factor for DVT/PE.  #Given history of smoking-discussed lung cancer screening program.  Patient is interested.  We will make a referral.  # DISPOSITION:  # Follow up as needed- Dr.B  Cc: Hayley/Perkins

## 2019-04-23 NOTE — Progress Notes (Signed)
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     HISTORY OF PRESENTING ILLNESS:  Natalie Frey 63 y.o.  female history of extensive DVT of the left lower extremity in March 2019 currently on Eliquis is here for follow-up/review of the work-up.  Patient interested to come off the anticoagulation.  Patient denies any blood in stools or black or stools but denies any worsening swelling in the legs.    No headaches.  No falls.   Patient complains of nausea vomiting and diarrhea which she attributes to metformin.  Nausea vomiting diarrhea is improved currently off metformin.  Review of Systems  Constitutional: Negative for chills, diaphoresis, fever, malaise/fatigue and weight loss.  HENT: Negative for nosebleeds and sore throat.   Eyes: Negative for double vision.  Respiratory: Negative for cough, hemoptysis, sputum production, shortness of breath and wheezing.   Cardiovascular: Negative for chest pain, palpitations, orthopnea and leg swelling.  Gastrointestinal: Negative for abdominal pain, blood in stool, constipation, diarrhea, heartburn, melena, nausea and vomiting.  Genitourinary: Negative for dysuria, frequency and urgency.  Musculoskeletal: Negative for back pain and joint pain.  Skin: Negative.  Negative for  itching and rash.  Neurological: Negative for dizziness, tingling, focal weakness, weakness and headaches.  Endo/Heme/Allergies: Does not bruise/bleed easily.  Psychiatric/Behavioral: Negative for depression. The patient is not nervous/anxious and does not have insomnia.      MEDICAL HISTORY:  Past Medical History:  Diagnosis Date  . Cancer ([place])   . Hyperlipidemia     SURGICAL HISTORY: Past Surgical History:  Procedure Laterality Date  . BLADDER SURGERY    . DILATION AND EVACUATION      SOCIAL HISTORY: Social History   Socioeconomic History  . Marital status: Widowed    Spouse name: Not on file  . Number of children: Not on file  . Years of education: Not on file  . Highest education level: Not on file  Occupational History  . Not on file  Social Needs  . Financial resource strain: Not hard at all  . Food insecurity    Worry: Never true    Inability: Never true  . Transportation needs    Medical: No    Non-medical: No  Tobacco Use  . Smoking status: Current Every Day Smoker    Packs/day: 1.00    Types: Cigarettes  . Smokeless tobacco: Never Used  Substance and Sexual Activity  . Alcohol use: No  . Drug use: Yes    Types: Marijuana  . Sexual activity: Not on file  Lifestyle  . Physical activity    Days per week: 6 days    Minutes per session: 40 min  . Stress: Not at all  Relationships  . Social connections    Talks on phone: More than three times a week    Gets together: Three times a week    Attends religious service: Never    Active member of club or organization: No  Attends meetings of clubs or organizations: Never    Relationship status: Living with partner  . Intimate partner violence    Fear of current or ex partner: No    Emotionally abused: No    Physically abused: No    Forced sexual activity: No  Other Topics Concern  . Not on file  Social History Narrative   [place]; smoking; 1ppd/cutting down. Social. Worked for [employer] from  [place].       Said she has been doing ok and does not need any assistance          - is currently on food stamps but those have been enough       NO consent form signed     FAMILY HISTORY: Family History  Problem Relation Age of Onset  . Deep vein thrombosis Daughter         when pregnant   . Pulmonary embolism Sister        while in hospital; other sister- on BCPs.     ALLERGIES:  has No Known Allergies.  MEDICATIONS:  Current Outpatient Medications  Medication Sig Dispense Refill  . acetaminophen (TYLENOL) 325 MG tablet Take 2 tablets (650 mg total) by mouth every 6 (six) hours as needed for mild pain (or Fever >/= 101).    Marland Kitchen apixaban (ELIQUIS) 5 MG TABS tablet TAKE ONE TABLET BY MOUTH 2 TIMES A DAY 60 tablet 0  . atorvastatin (LIPITOR) 20 MG tablet TAKE 1 TABLET (20 MG) BY MOUTH EVERY DAY 90 tablet 0  . amphetamine-dextroamphetamine (ADDERALL) 30 MG tablet Take 1 tablet by mouth daily.    . traMADol (ULTRAM) 50 MG tablet Take 1 tablet by mouth every 8 (eight) hours.     No current facility-administered medications for this visit.       Marland Kitchen  PHYSICAL EXAMINATION: ECOG PERFORMANCE STATUS: 0 - Asymptomatic  Vitals:   04/23/19 1043  BP: (!) 166/84  Pulse: 88  Resp: 20  Temp: (!) 96.6 F (35.9 C)   Filed Weights   04/23/19 1043  Weight: 194 lb (88 kg)    Physical Exam  Constitutional: She is oriented to person, place, and time and well-developed, well-nourished, and in no distress.  HENT:  Head: Normocephalic and atraumatic.  Mouth/Throat: Oropharynx is clear and moist. No oropharyngeal exudate.  Eyes: Pupils are equal, round, and reactive to light.  Neck: Normal range of motion. Neck supple.  Cardiovascular: Normal rate and regular rhythm.  Pulmonary/Chest: No respiratory distress. She has no wheezes.  Abdominal: Soft. Bowel sounds are normal. She exhibits no distension and no mass. There is no abdominal tenderness. There is no rebound and no guarding.   Musculoskeletal: Normal range of motion.        General: No tenderness or edema.  Neurological: She is alert and oriented to person, place, and time.  Skin: Skin is warm.  Psychiatric: Affect normal.     LABORATORY DATA:  I have reviewed the data as listed Lab Results  Component Value Date   WBC 9.0 04/09/2019   HGB 14.8 04/09/2019   HCT 44.5 04/09/2019   MCV 94.5 04/09/2019   PLT 281 04/09/2019   Recent Labs    10/22/18 0939 03/11/19 1022 04/09/19 1137  NA 140 140 137  K 4.4 4.5 4.2  CL 103 107* 106  CO2 20 17* 22  GLUCOSE 135* 124* 117*  BUN 9 11 12   CREATININE 0.66 0.61 0.54  CALCIUM 9.1 9.4 8.9  GFRNONAA 96  99 >60  GFRAA 111 114 >60  PROT 7.0 7.3 7.6  ALBUMIN 4.3 4.5 4.1  AST 16 16 19   ALT 13 17 16   ALKPHOS 68 73 63  BILITOT 0.6 1.0 0.8    RADIOGRAPHIC STUDIES: I have personally reviewed the radiological images as listed and agreed with the findings in the report. US Venous Img Lower Unilateral Left  Result Date: 04/09/2019 CLINICAL DATA:  63 year old female with a history of chronic DVT EXAM: LEFT LOWER EXTREMITY VENOUS DOPPLER ULTRASOUND TECHNIQUE: Gray-scale sonography with graded compression, as well as color Doppler and duplex ultrasound were performed to evaluate the lower extremity deep venous systems from the level of the common femoral vein and including the common femoral, femoral, profunda femoral, popliteal and calf veins including the posterior tibial, peroneal and gastrocnemius veins when visible. The superficial great saphenous vein was also interrogated. Spectral Doppler was utilized to evaluate flow at rest and with distal augmentation maneuvers in the common femoral, femoral and popliteal veins. COMPARISON:  12/23/2017 FINDINGS: Contralateral Common Femoral Vein: Respiratory phasicity is normal and symmetric with the symptomatic side. No evidence of thrombus. Normal compressibility. Common Femoral Vein: No evidence of thrombus. Normal  compressibility, respiratory phasicity and response to augmentation. Saphenofemoral Junction: No evidence of thrombus. Normal compressibility and flow on color Doppler imaging. Profunda Femoral Vein: No evidence of thrombus. Normal compressibility and flow on color Doppler imaging. Femoral Vein: Patent femoral vein with complete compressibility and phasicity maintained. Popliteal Vein: Wall thickening of the popliteal vein. Popliteal vein remains patent. Incompletely compressible. Calf Veins: No evidence of thrombus. Normal compressibility and flow on color Doppler imaging. Superficial Great Saphenous Vein: No evidence of thrombus. Normal compressibility and flow on color Doppler imaging. Other Findings:  None. IMPRESSION: Sonographic survey of the left lower extremity negative for acute DVT. Chronic changes of prior episode of DVT within the popliteal vein, which remains patent. Electronically Signed   By: Corrie Mckusick D.O.   On: 04/09/2019 14:26    ASSESSMENT & PLAN:   Chronic deep vein thrombosis (DVT) of femoral vein of left lower extremity (HCC) #March 2019-extensive DVT of the left lower extremity-above and below knee.  Extensive hypercoagulable work-up-today is negative.  July 2020 ultrasound shows patent veins with suggestion of chronic DVT.  #Discussed with the patient that she has likely unprovoked DVT and she has been on anticoagulation for more than a year now.  I think is reasonable to stop anticoagulation.  #I discussed the potential possibility of recurrence of DVT/PE.  If unfortunately if this happen patient will need lifelong anticoagulation.  #Abdominal cramps diarrhea nausea vomiting-quit related to metformin.  Currently improved off metformin.  Patient's random blood sugars of 117 today.  I have asked the patient to talk to her PCP regarding discontinuation of metformin.  #Discussed DVT prophylaxis during travels [from [place]]-Eliquis for about 3 days around the days of  travel.  # Discussed with the patient regarding the ill effects of smoking- including but not limited to cardiac lung and vascular diseases and malignancies. Counseled against smoking.  Patient is interested.  She is in the process of cutting down on smoking.  Also discussed that smoking is a significant risk factor for DVT/PE.  #Given history of smoking-discussed lung cancer screening program.  Patient is interested.  We will make a referral.  # DISPOSITION:  # Follow up as needed- Dr.B  Cc: Hayley/Perkins    All questions were answered. The patient knows to call the clinic with any problems,  questions or concerns.    Cammie Sickle, MD 04/23/2019 12:13 PM

## 2019-04-27 ENCOUNTER — Emergency Department
Admission: EM | Admit: 2019-04-27 | Discharge: 2019-04-27 | Disposition: A | Discharge status: Home / Self Care | Payer: Self-pay | Attending: Emergency Medicine | Admitting: Emergency Medicine

## 2019-04-27 ENCOUNTER — Encounter: Payer: Self-pay | Admitting: *Deleted

## 2019-04-27 ENCOUNTER — Other Ambulatory Visit: Payer: Self-pay

## 2019-04-27 DIAGNOSIS — M549 Dorsalgia, unspecified: Secondary | ICD-10-CM | POA: Insufficient documentation

## 2019-04-27 DIAGNOSIS — Z5321 Procedure and treatment not carried out due to patient leaving prior to being seen by health care provider: Secondary | ICD-10-CM | POA: Insufficient documentation

## 2019-04-27 DIAGNOSIS — M79601 Pain in right arm: Secondary | ICD-10-CM | POA: Insufficient documentation

## 2019-04-27 HISTORY — DX: Acute embolism and thrombosis of unspecified femoral vein: I82.419

## 2019-04-27 LAB — BASIC METABOLIC PANEL
Anion gap: 10 (ref 5–15)
BUN: 10 mg/dL (ref 6–20)
CO2: 20 mmol/L — ABNORMAL LOW (ref 22–32)
Calcium: 8.9 mg/dL (ref 8.9–10.3)
Chloride: 109 mmol/L (ref 98–111)
Creatinine, Ser: 0.58 mg/dL (ref 0.44–1.00)
GFR calc Af Amer: >60 mL/min (ref >60)
GFR calc non Af Amer: >60 mL/min (ref >60)
Glucose, Bld: 143 mg/dL — ABNORMAL HIGH (ref 70–99)
Potassium: 3.7 mmol/L (ref 3.5–5.1)
Sodium: 139 mmol/L (ref 135–145)

## 2019-04-27 LAB — CBC
HCT: 42.3 % (ref 36.0–46.0)
Hemoglobin: 14.1 g/dL (ref 12.0–15.0)
MCH: 31.8 pg (ref 26.0–34.0)
MCHC: 33.3 g/dL (ref 30.0–36.0)
MCV: 95.3 fL (ref 80.0–100.0)
Platelets: 267 10*3/uL (ref 150–400)
RBC: 4.44 MIL/uL (ref 3.87–5.11)
RDW: 13.1 % (ref 11.5–15.5)
WBC: 10.5 10*3/uL (ref 4.0–10.5)
nRBC: 0 % (ref 0.0–0.2)

## 2019-04-27 NOTE — ED Triage Notes (Signed)
Pt reporting left sided weakness and "not feeling right." Hx of blood clot in left side. Pt recently had US performed to confirm the blood clot was smaller and pt was taken off blood thinners. Since Saturday her left leg has not felt normal and has been numb. Pt also reporting a fall yesterday where she hit her right arm, side and back.

## 2019-04-28 ENCOUNTER — Telehealth: Payer: Self-pay | Admitting: Emergency Medicine

## 2019-04-28 NOTE — Telephone Encounter (Signed)
Called patient due to lwot to inquire about condition and follow up plans. Left message.   

## 2019-04-29 ENCOUNTER — Other Ambulatory Visit: Payer: Self-pay

## 2019-04-29 ENCOUNTER — Ambulatory Visit: Payer: Self-pay | Admitting: Gerontology

## 2019-04-29 DIAGNOSIS — R202 Paresthesia of skin: Secondary | ICD-10-CM | POA: Insufficient documentation

## 2019-04-29 NOTE — Progress Notes (Signed)
Established Patient Office Visit  Subjective:  Patient ID: Natalie Frey, female    DOB: October 12, 1955  Age: 63 y.o. MRN: [id number]  CC:  Chief Complaint  Patient presents with  . Follow-up    barely use left arm or walk  Patient consents to telephone visit and 2 patient identifier was used to identify patient.  HPI Urijah Raynor went to the ED on 04/27/19 for left sided weakness and "not feeling right", but she left after waiting for 3  Hours. Her hypercoagulable work up that was done on 04/23/19 was negative and was advised to stop Eliquis 5 mg bid. Today she complaint of weakness to left arm and leg, and she is experiencing "partial loss of mobility to left arm and leg" that started over the weekend. She reports that "she can't hold anything with her left hand and drags her left leg behind her to keep up". She states that she lost her mobility and fell last Sunday. She checked her blood pressure at home and it was 180/91 and 98.She denies visual changes, light headedness, chest pain, fever, chills and no further concern.  Past Medical History:  Diagnosis Date  . Cancer ([place])   . Dvt femoral (deep venous thrombosis) ([place])   . Hyperlipidemia     Past Surgical History:  Procedure Laterality Date  . BLADDER SURGERY    . DILATION AND EVACUATION      Family History  Problem Relation Age of Onset  . Deep vein thrombosis Daughter         when pregnant   . Pulmonary embolism Sister        while in hospital; other sister- on BCPs.     Social History   Socioeconomic History  . Marital status: Widowed    Spouse name: Not on file  . Number of children: Not on file  . Years of education: Not on file  . Highest education level: Not on file  Occupational History  . Not on file  Social Needs  . Financial resource strain: Not hard at all  . Food insecurity    Worry: Never true    Inability: Never true  . Transportation needs    Medical: No    Non-medical: No  Tobacco Use  . Smoking  status: Current Every Day Smoker    Packs/day: 1.00    Types: Cigarettes  . Smokeless tobacco: Never Used  Substance and Sexual Activity  . Alcohol use: No  . Drug use: Yes    Types: Marijuana  . Sexual activity: Not on file  Lifestyle  . Physical activity    Days per week: 6 days    Minutes per session: 40 min  . Stress: Not at all  Relationships  . Social connections    Talks on phone: More than three times a week    Gets together: Three times a week    Attends religious service: Never    Active member of club or organization: No    Attends meetings of clubs or organizations: Never    Relationship status: Living with partner  . Intimate partner violence    Fear of current or ex partner: No    Emotionally abused: No    Physically abused: No    Forced sexual activity: No  Other Topics Concern  . Not on file  Social History Narrative   [place]; smoking; 1ppd/cutting down. Social. Worked for [employer] from [place].       Said she has  been doing ok and does not need any assistance          - is currently on food stamps but those have been enough       NO consent form signed     Outpatient Medications Prior to Visit  Medication Sig Dispense Refill  . acetaminophen (TYLENOL) 325 MG tablet Take 2 tablets (650 mg total) by mouth every 6 (six) hours as needed for mild pain (or Fever >/= 101).    Marland Kitchen amphetamine-dextroamphetamine (ADDERALL) 30 MG tablet Take 1 tablet by mouth daily.    Marland Kitchen apixaban (ELIQUIS) 5 MG TABS tablet TAKE ONE TABLET BY MOUTH 2 TIMES A DAY 60 tablet 0  . atorvastatin (LIPITOR) 20 MG tablet TAKE 1 TABLET (20 MG) BY MOUTH EVERY DAY 90 tablet 0  . traMADol (ULTRAM) 50 MG tablet Take 1 tablet by mouth every 8 (eight) hours.     No facility-administered medications prior to visit.     No Known Allergies  ROS Review of Systems  Constitutional: Negative.   HENT: Negative.   Eyes: Negative.   Respiratory: Negative.   Cardiovascular: Negative.    Musculoskeletal: Positive for gait problem (states that she drags her left leg).  Skin: Negative.   Neurological: Positive for weakness (left arm and leg). Negative for dizziness, light-headedness, numbness and headaches.  Psychiatric/Behavioral: Negative.       Objective:    Physical Exam No vital sign or PE was done There were no vitals taken for this visit. Wt Readings from Last 3 Encounters:  04/27/19 194 lb (88 kg)  04/23/19 194 lb (88 kg)  11/13/18 180 lb 12.8 oz (82 kg)     Health Maintenance Due  Topic Date Due  . Hepatitis C Screening  Jun 13, 1956  . PNEUMOCOCCAL POLYSACCHARIDE VACCINE AGE 57-64 HIGH RISK  09/02/1960  . FOOT EXAM  09/02/1968  . OPHTHALMOLOGY EXAM  09/02/1968  . URINE MICROALBUMIN  09/02/1968  . TETANUS/TDAP  09/02/1977  . PAP SMEAR-Modifier  09/03/1979  . MAMMOGRAM  09/02/2008  . COLONOSCOPY  09/02/2008    There are no preventive care reminders to display for this patient.  Lab Results  Component Value Date   TSH 1.840 04/17/2018   Lab Results  Component Value Date   WBC 10.5 04/27/2019   HGB 14.1 04/27/2019   HCT 42.3 04/27/2019   MCV 95.3 04/27/2019   PLT 267 04/27/2019   Lab Results  Component Value Date   NA 139 04/27/2019   K 3.7 04/27/2019   CO2 20 (L) 04/27/2019   GLUCOSE 143 (H) 04/27/2019   BUN 10 04/27/2019   CREATININE 0.58 04/27/2019   BILITOT 0.8 04/09/2019   ALKPHOS 63 04/09/2019   AST 19 04/09/2019   ALT 16 04/09/2019   PROT 7.6 04/09/2019   ALBUMIN 4.1 04/09/2019   CALCIUM 8.9 04/27/2019   ANIONGAP 10 04/27/2019   Lab Results  Component Value Date   CHOL 165 03/11/2019   Lab Results  Component Value Date   HDL 36 (L) 03/11/2019   Lab Results  Component Value Date   LDLCALC 91 03/11/2019   Lab Results  Component Value Date   TRIG 188 (H) 03/11/2019   Lab Results  Component Value Date   CHOLHDL 4.6 (H) 03/11/2019   Lab Results  Component Value Date   HGBA1C 6.6 (H) 03/11/2019       Assessment & Plan:     1. Paresthesia of left arm and leg - She was advised to go  to the nearest ED.    Follow-up: Return in about 6 days (around 05/05/2019), or if symptoms worsen or fail to improve.    Xzavion Doswell Jerold Coombe, NP

## 2019-04-30 MED ORDER — MELATONIN 3 MG PO TABS
3.00 | ORAL_TABLET | ORAL | Status: DC
Start: ? — End: 2019-04-30

## 2019-04-30 MED ORDER — ENOXAPARIN SODIUM 40 MG/0.4ML ~~LOC~~ SOLN
40.00 | SUBCUTANEOUS | Status: DC
Start: 2019-05-01 — End: 2019-04-30

## 2019-04-30 MED ORDER — NICOTINE POLACRILEX 4 MG MT LOZG
4.00 | LOZENGE | OROMUCOSAL | Status: DC
Start: ? — End: 2019-04-30

## 2019-04-30 MED ORDER — ATORVASTATIN CALCIUM 80 MG PO TABS
80.00 | ORAL_TABLET | ORAL | Status: DC
Start: 2019-05-01 — End: 2019-04-30

## 2019-04-30 MED ORDER — ASPIRIN 81 MG PO CHEW
81.00 | CHEWABLE_TABLET | ORAL | Status: DC
Start: 2019-05-01 — End: 2019-04-30

## 2019-04-30 MED ORDER — NICOTINE 21 MG/24HR TD PT24
1.00 | MEDICATED_PATCH | TRANSDERMAL | Status: DC
Start: 2019-05-01 — End: 2019-04-30

## 2019-05-01 ENCOUNTER — Telehealth: Payer: Self-pay | Admitting: *Deleted

## 2019-05-01 NOTE — Telephone Encounter (Signed)
Contacted regarding lung screening scan and patient requests a call back next week.

## 2019-05-05 ENCOUNTER — Ambulatory Visit: Payer: Self-pay | Admitting: Gerontology

## 2019-05-05 ENCOUNTER — Other Ambulatory Visit: Payer: Self-pay

## 2019-05-05 ENCOUNTER — Encounter: Payer: Self-pay | Admitting: Gerontology

## 2019-05-05 VITALS — BP 150/79 | HR 98

## 2019-05-05 DIAGNOSIS — R030 Elevated blood-pressure reading, without diagnosis of hypertension: Secondary | ICD-10-CM

## 2019-05-05 DIAGNOSIS — R4589 Other symptoms and signs involving emotional state: Secondary | ICD-10-CM | POA: Insufficient documentation

## 2019-05-05 DIAGNOSIS — E119 Type 2 diabetes mellitus without complications: Secondary | ICD-10-CM

## 2019-05-05 DIAGNOSIS — I639 Cerebral infarction, unspecified: Secondary | ICD-10-CM | POA: Insufficient documentation

## 2019-05-05 DIAGNOSIS — F418 Other specified anxiety disorders: Secondary | ICD-10-CM

## 2019-05-05 NOTE — Progress Notes (Signed)
Established Patient Office Visit  Subjective:  Patient ID: Natalie Frey, female    DOB: 10-Dec-1955  Age: 63 y.o. MRN: [id number]  CC:  Chief Complaint  Patient presents with  . 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 She will follow up in 1-2 months with [facility] Dr. Franchot Mimes. She reports that she's feeling stronger everyday, she uses walker, limps on the left side and states that she's waiting for Physical Therapy referral. She states that her left hand grip is stronger, numbness to left thigh and toes has improved  and she's able to flex and extend her left toes. She reports that she continues to experience anxiety sometimes about her health condition, she states that her mood is good, denies suicidal or homicidal ideation. She states that she uses nicotine patch, nicorette gum and cutting back on smoking. She reports that she couldn't tolerate Metformin and doesn't check blood glucose at home. She states that she has made life style modifications. She denies chest pain, palpitation, hypo/hyperglycemic symptoms, fever, chills and no further concerns.  Past Medical History:  Diagnosis Date  . Cancer ([place])   . Dvt femoral (deep venous thrombosis) ([place])   . Hyperlipidemia     Past Surgical History:  Procedure Laterality Date  . BLADDER SURGERY    . DILATION AND EVACUATION      Family History  Problem Relation Age of Onset  . Deep vein thrombosis Daughter         when pregnant   . Pulmonary embolism Sister        while in hospital; other sister- on BCPs.     Social History   Socioeconomic History  . Marital status: Widowed    Spouse name: Not on file  . Number of children: Not on file  . Years of education: Not on file  . Highest  education level: Not on file  Occupational History  . Not on file  Social Needs  . Financial resource strain: Not hard at all  . Food insecurity    Worry: Never true    Inability: Never true  . Transportation needs    Medical: No    Non-medical: No  Tobacco Use  . Smoking status: Current Every Day Smoker    Packs/day: 1.00    Types: Cigarettes  . Smokeless tobacco: Never Used  Substance and Sexual Activity  . Alcohol use: No  . Drug use: Yes    Types: Marijuana  . Sexual activity: Not on file  Lifestyle  . Physical activity    Days per week: 6 days    Minutes per session: 40 min  . Stress: Not at all  Relationships  . Social connections    Talks on phone: More than three times a week    Gets together: Three times a week    Attends religious service: Never    Active member of club or organization: No    Attends meetings of clubs or organizations: Never    Relationship status: Living with partner  . Intimate partner violence    Fear of current or ex partner: No    Emotionally abused: No    Physically abused: No    Forced sexual activity: No  Other Topics Concern  . Not on file  Social History Narrative   [place]; smoking; 1ppd/cutting down. Social. Worked for [employer] from [place].       Said she has been doing ok and does not need any assistance          - is currently on food stamps but those have been enough       NO consent form signed     Outpatient Medications Prior to Visit  Medication Sig Dispense Refill  . acetaminophen (TYLENOL) 325 MG tablet Take 2 tablets (650 mg total) by mouth every 6 (six) hours as needed for mild pain (or Fever >/= 101).    Marland Kitchen aspirin EC 81 MG tablet Take 81 mg by mouth daily.    Marland Kitchen atorvastatin (LIPITOR) 20 MG tablet TAKE 1 TABLET (20 MG) BY MOUTH EVERY DAY 90 tablet 0  . amphetamine-dextroamphetamine (ADDERALL) 30 MG tablet Take 1 tablet by mouth daily.    . traMADol (ULTRAM) 50 MG tablet Take 1 tablet by mouth every 8  (eight) hours.    Marland Kitchen apixaban (ELIQUIS) 5 MG TABS tablet TAKE ONE TABLET BY MOUTH 2 TIMES A DAY (Patient not taking: Reported on 05/05/2019) 60 tablet 0   No facility-administered medications prior to visit.     No Known Allergies  ROS Review of Systems  Constitutional: Negative.   HENT: Negative.   Eyes: Negative.   Respiratory: Negative.   Cardiovascular: Negative.   Gastrointestinal: Negative.   Genitourinary: Negative.   Musculoskeletal: Positive for gait problem.  Skin: Negative.   Neurological: Positive for weakness (mild weakness to left arm and leg) and numbness (minimal numbness to left thigh). Negative for dizziness and light-headedness.  Hematological: Negative.   Psychiatric/Behavioral: The patient is nervous/anxious.       Objective:    Physical Exam  Constitutional: She is oriented to person, place, and time. She appears well-developed and well-nourished.  HENT:  Head: Normocephalic and atraumatic.  Eyes: Pupils are equal, round, and reactive to light. EOM are normal.  Neck: Normal range of motion.  Cardiovascular: Normal rate and regular rhythm.  Pulmonary/Chest: Effort normal.  Abdominal: Soft. Bowel sounds are normal.  Musculoskeletal: Normal range of motion.  Neurological: She is alert and oriented to person, place, and time. Gait (mild limpy gait) abnormal.  4/5 Muscle strength to left arm and leg.  Skin: Skin is warm and dry.  Psychiatric: She has a normal mood and affect. Her behavior is normal. Judgment and thought content normal.    BP (!) 150/79 (BP Location: Right Arm, Patient Position: Sitting)   Pulse 98   SpO2 98%  Wt Readings from Last 3 Encounters:  04/27/19 194 lb (88 kg)  04/23/19 194 lb (88 kg)  11/13/18 180 lb 12.8 oz (82 kg)     Health Maintenance Due  Topic Date Due  . Hepatitis C Screening  09/07/1956  . PNEUMOCOCCAL POLYSACCHARIDE VACCINE AGE 63-64 HIGH RISK  09/02/1960  . FOOT EXAM  09/02/1968  . OPHTHALMOLOGY EXAM   09/02/1968  . URINE MICROALBUMIN  09/02/1968  . TETANUS/TDAP  09/02/1977  . PAP SMEAR-Modifier  09/03/1979  . MAMMOGRAM  09/02/2008  . COLONOSCOPY  09/02/2008  . INFLUENZA VACCINE  05/02/2019    There are no preventive care reminders to display for this patient.  Lab Results  Component Value Date   TSH 1.840 04/17/2018   Lab Results  Component Value Date   WBC 10.5 04/27/2019   HGB 14.1 04/27/2019  HCT 42.3 04/27/2019   MCV 95.3 04/27/2019   PLT 267 04/27/2019   Lab Results  Component Value Date   NA 139 04/27/2019   K 3.7 04/27/2019   CO2 20 (L) 04/27/2019   GLUCOSE 143 (H) 04/27/2019   BUN 10 04/27/2019   CREATININE 0.58 04/27/2019   BILITOT 0.8 04/09/2019   ALKPHOS 63 04/09/2019   AST 19 04/09/2019   ALT 16 04/09/2019   PROT 7.6 04/09/2019   ALBUMIN 4.1 04/09/2019   CALCIUM 8.9 04/27/2019   ANIONGAP 10 04/27/2019   Lab Results  Component Value Date   CHOL 165 03/11/2019   Lab Results  Component Value Date   HDL 36 (L) 03/11/2019   Lab Results  Component Value Date   LDLCALC 91 03/11/2019   Lab Results  Component Value Date   TRIG 188 (H) 03/11/2019   Lab Results  Component Value Date   CHOLHDL 4.6 (H) 03/11/2019   Lab Results  Component Value Date   HGBA1C 6.6 (H) 03/11/2019   Her spouse was educated on using glucometer, he was able to check her blood glucose during office visit and it was 133 mg/dl.    Assessment & Plan:     1. Type 2 diabetes mellitus without complication, without long-term current use of insulin (HCC) - Metformin was discontinued due to intolerance. Her HgbA1c on 03/11/19 was 6.6%. She was advised to check fasting blood glucose daily, record and bring to clinic in 2 weeks. She was advised to continue on low carb, low concentrated sweet diet.  2. Ischemic stroke diagnosed during current admission [facility]) - She will continue on current treatment, and was educated on Stroke symptoms and signs and was advised to go to the ED  with any symptoms. - Ambulatory referral to Physical Therapy was sent to [facility] clinic.  3. Anxiety about health - She will follow up with Ms. Simpson H.  4. Elevated blood pressure reading - She was advised to check and record blood pressure daily and bring log to 2 weeks follow up. She was advised to continue on DASH diet, increase water intake and encouraged on smoking cessation.   Follow-up: Return in about 2 weeks (around 05/19/2019), or if symptoms worsen or fail to improve.    Yahshua Thibault Jerold Coombe, NP

## 2019-05-05 NOTE — Patient Instructions (Signed)

## 2019-05-06 DIAGNOSIS — R030 Elevated blood-pressure reading, without diagnosis of hypertension: Secondary | ICD-10-CM | POA: Insufficient documentation

## 2019-05-08 ENCOUNTER — Telehealth: Payer: Self-pay | Admitting: *Deleted

## 2019-05-08 ENCOUNTER — Encounter: Payer: Self-pay | Admitting: *Deleted

## 2019-05-08 DIAGNOSIS — Z87891 Personal history of nicotine dependence: Secondary | ICD-10-CM

## 2019-05-08 DIAGNOSIS — Z122 Encounter for screening for malignant neoplasm of respiratory organs: Secondary | ICD-10-CM

## 2019-05-08 NOTE — Telephone Encounter (Signed)
Received referral for initial lung cancer screening scan. Contacted patient and obtained smoking history,(current, 58.75 pack year) as well as answering questions related to screening process. Patient denies signs of lung cancer such as weight loss or hemoptysis. Patient denies comorbidity that would prevent curative treatment if lung cancer were found. Patient is scheduled for shared decision making visit and CT scan on 05/14/19 at 130pm.

## 2019-05-14 ENCOUNTER — Ambulatory Visit
Admission: RE | Admit: 2019-05-14 | Discharge: 2019-05-14 | Disposition: A | Discharge status: Home / Self Care | Payer: Self-pay | Source: Ambulatory Visit | Attending: Oncology | Admitting: Oncology

## 2019-05-14 ENCOUNTER — Ambulatory Visit: Payer: Self-pay | Admitting: Licensed Clinical Social Worker

## 2019-05-14 ENCOUNTER — Other Ambulatory Visit: Payer: Self-pay

## 2019-05-14 ENCOUNTER — Inpatient Hospital Stay: Payer: Self-pay | Attending: Oncology | Admitting: Nurse Practitioner

## 2019-05-14 DIAGNOSIS — Z87891 Personal history of nicotine dependence: Secondary | ICD-10-CM | POA: Insufficient documentation

## 2019-05-14 DIAGNOSIS — F4323 Adjustment disorder with mixed anxiety and depressed mood: Secondary | ICD-10-CM

## 2019-05-14 DIAGNOSIS — Z122 Encounter for screening for malignant neoplasm of respiratory organs: Secondary | ICD-10-CM | POA: Insufficient documentation

## 2019-05-14 NOTE — BH Specialist Note (Signed)
Integrated Behavioral Health Comprehensive Clinical Assessment Via Phone  MRN: [id number] Name: Natalie Frey  Type of Service: Integrated Behavioral Health-Individual Interpretor: No. Interpretor Name and Language: Not applicable.   PRESENTING CONCERNS: Natalie Frey is a 63 y.o. female accompanied by herself. Natalie Frey was referred to [facility] clinician for mental health by [facility]  Previous mental health services Have you ever been treated for a mental health problem? Yes If "Yes", when were you treated and whom did you see? Ms. Cantu reports that she received mental health counseling on and off at hospice in [place] for two years after her husband passed away ten years ago.  Have you ever been hospitalized for mental health treatment? No Have you ever been treated for any of the following? Past Psychiatric History/Hospitalization(s): Anxiety: Yes Ms. Lesko reports that she started experiencing symptoms following her stroke on July 29th 2020. She describes feeling anxious about having a stroke again. Her symptoms include: feeling anxious, nervous, and on edge nearly everyday, not being able to stop or control her worrying, worrying too much about different things, restlessness, difficulty relaxing, becoming easily annoyed or irritable, and feeling afraid as if something awful might happen.  Bipolar Disorder: Negative Depression: Yes Ms. Angert reports that she has been feeling down and depressed following her stroke on July 29th 2020. Her symptoms include: feeling down and depressed nearly everyday, loss of interest in previously enjoyed activities, fatigue, feeling bad about herself, difficulty concentrating, excessive worrying, insomnia, and restlessness. She denies suicidal and homicidal thoughts. She denies having access to a fire arm.  Mania: Negative Psychosis: Negative Schizophrenia: Negative Personality Disorder: Negative Hospitalization for psychiatric  illness: Negative History of Electroconvulsive Shock Therapy: Negative Prior Suicide Attempts: No Have you ever had thoughts of harming yourself or others or attempted suicide? No plan to harm self or others  Medical history  has a past medical history of Cancer ([place]), Dvt femoral (deep venous thrombosis) ([place]), and Hyperlipidemia. Primary Care Physician: Patient, No Pcp Per Date of last physical exam:  Allergies: No Known Allergies Current medications:  Outpatient Encounter Medications as of 05/14/2019  Medication Sig  . acetaminophen (TYLENOL) 325 MG tablet Take 2 tablets (650 mg total) by mouth every 6 (six) hours as needed for mild pain (or Fever >/= 101).  Marland Kitchen amphetamine-dextroamphetamine (ADDERALL) 30 MG tablet Take 1 tablet by mouth daily.  Marland Kitchen aspirin EC 81 MG tablet Take 81 mg by mouth daily.  Marland Kitchen atorvastatin (LIPITOR) 20 MG tablet TAKE 1 TABLET (20 MG) BY MOUTH EVERY DAY  . traMADol (ULTRAM) 50 MG tablet Take 1 tablet by mouth every 8 (eight) hours.   No facility-administered encounter medications on file as of 05/14/2019.    Have you ever had any serious medication reactions? No Is there any history of mental health problems or substance abuse in your family? No Has anyone in your family been hospitalized for mental health treatment? No  Social/family history Who lives in your current household? Ms. Biswas lives with her boyfriend of nine years. She is a widow. She was married for 35 years until her husband passed away from cancer ten years ago. She has two daughters; 4 and 6. One of her daughters lives in [place] and the other lives in [place], [place]. She has six grandchildren ranging from age 67 to 75 years old.  What is your family of origin, childhood history? Ms. Tonnesen did not disclose her family of origin.  Where were you born? Ms. Killough  was born in [place] and grew up outside of [place].  Where did you grow up? See above. How many different homes have you lived in? A  few. Describe your childhood: Ms. Bubeck reports that her childhood was good. She notes that she was raised by her mom and dad was not in the picture.  Do you have siblings, step/half siblings? Yes- Ms. Ninneman is the youngest of five siblings. She has a 20 year old sister, 46 year old sister, a 71 year old brother and a 10 year old brother. Her two brothers are deceased.  What are their names, relation, sex, age? See above. Are your parents separated or divorced? No Ms. Szczerba reports that her parents were not married.  What are your social supports? Ms. Mckain has the support of her boyfriend and two sisters. One of her sisters lives in [place] and the other in [place].   Education How many grades have you completed? 12th grade Did you have any problems in school? No  Employment/financial issues Ms. 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   Sleep Usual bedtime varies. Sleeping arrangements: with her boyfriend. Problems with snoring: No Obstructive sleep apnea is not a concern. Problems with nightmares: No Problems with night terrors: No Problems with sleepwalking: No  Trauma/Abuse history Have you ever experienced or been exposed to any form of abuse? No Have you ever experienced or been exposed to something traumatic? No  Substance use Do you use alcohol, nicotine or caffeine? no alcohol use How old were you when you first tasted alcohol? Teens. Have you ever used illicit drugs or abused prescription medications? Ms. Martian denies using or abusing alchol or other drugs.   Mental status General appearance/Behavior: Unable to access die to phone visit.  Eye contact: Absent Motor behavior: unable to assess due phone visit.  Speech: Normal Level of consciousness: Alert Mood: Depressed and anxious Affect: Blunt and Tearful Anxiety level: Moderate Thought process: Coherent Thought content:  WNL Perception: Normal Judgment: Fair Insight: Present  Diagnosis No diagnosis found.  GOALS ADDRESSED: Patient will reduce symptoms of: anxiety , depression, insomnia and increase knowledge and/or ability of: coping skills, healthy habits, self-management skills and stress reduction and also: Increase healthy adjustment to current life circumstances              INTERVENTIONS: Interventions utilized: Biopsychology social assessment.  Standardized Assessments completed: GAD-7 and PHQ 9   ASSESSMENT/OUTCOME:  Holle Sprick is a 63 year old Caucasian female who presents today for a mental health assessment via phone and was referred by Carlyon Shadow NP. Ms. Butters symptoms of anxiety and depression began following her stroke on July 29th 2020. She previously had therapy on and off for two years through hospice following her husband's death ten years ago. She explains that she was prescribed an antidepressant following her husband's death that she took for a few months but is unable to remember the name or dosage of the medication. She denies being hospitalized for mental illness or substance abuse. She denies a history of abusing alcohol or other drugs. She has not previously been in substance abuse treatment.   Ms. Nass has a history of deep venous thrombosis, type II diabetes, swelling in her legs, cancer, hyperlipidemia, and paresthesia of her left arm and leg. She recently suffered an Ischemic stroke on July 29th 2020 and was treated at [facility]. She  denies any history of negative reactions to medications. She denies having any allergies. She has had bladder surgery, dialation and evacuation. She smokes a pack of cigarettes a day.   Ms. Cronce lives with her boyfriend of nine years. She is a widow and was married for 35 years. Her husband passed away ten years ago due to cancer. She has two children, 35 and 40. One of her daughter's lives in [place] and the other in [place], [place]. 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 She has a lot of stress over finances including hospital bills. She denies a history of mental illness or substance abuse in her family.   PLAN:Case consultation with Dr. Octavia Heir, MD, psychiatric consultant on August 18th 2020 @ 9 am.   Scheduled next visit: two weeks or earlier if needed.   [place] [employer]

## 2019-05-14 NOTE — Progress Notes (Signed)
Virtual Visit via Video Enabled Telemedicine Note   I connected with Natalie Frey on 05/14/19 at 1:45 PM EST by video enabled telemedicine visit and verified that I am speaking with the correct person using two identifiers.   I discussed the limitations, risks, security and privacy concerns of performing an evaluation and management service by telemedicine and the availability of in-person appointments. I also discussed with the patient that there may be a patient responsible charge related to this service. The patient expressed understanding and agreed to proceed.   Other persons participating in the visit and their role in the encounter: Burgess Estelle, RN- checking in patient & navigation  Patient's location: clinic  Provider's location: home  Chief Complaint: Low Dose CT Screening  Patient agreed to evaluation by telemedicine to discuss shared decision making for consideration of low dose CT lung cancer screening.    In accordance with CMS guidelines, patient has met eligibility criteria including age, absence of signs or symptoms of lung cancer.  Social History   Tobacco Use  . Smoking status: Current Every Day Smoker    Packs/day: 1.25    Years: 47.00    Pack years: 58.75    Types: Cigarettes  . Smokeless tobacco: Never Used  Substance Use Topics  . Alcohol use: No     A shared decision-making session was conducted prior to the performance of CT scan. This includes one or more decision aids, includes benefits and harms of screening, follow-up diagnostic testing, over-diagnosis, false positive rate, and total radiation exposure.   Counseling on the importance of adherence to annual lung cancer LDCT screening, impact of co-morbidities, and ability or willingness to undergo diagnosis and treatment is imperative for compliance of the program.   Counseling on the importance of continued smoking cessation for former smokers; the importance of smoking cessation for current smokers, and  information about tobacco cessation interventions have been given to patient including [place] and [employer] [place] programs.   Written order for lung cancer screening with LDCT has been given to the patient and any and all questions have been answered to the best of my abilities.    Yearly follow up will be coordinated by Burgess Estelle, Thoracic Navigator.  I discussed the assessment and treatment plan with the patient. The patient was provided an opportunity to ask questions and all were answered. The patient agreed with the plan and demonstrated an understanding of the instructions.   The patient was advised to call back or seek an in-person evaluation if the symptoms worsen or if the condition fails to improve as anticipated.   I provided 15 minutes of face-to-face video visit time during this encounter, and > 50% was spent counseling as documented under my assessment & plan.   Beckey Rutter, DNP, AGNP-C [place] [facility] [phone] (work cell) [phone] (office)

## 2019-05-15 ENCOUNTER — Encounter: Payer: Self-pay | Admitting: *Deleted

## 2019-05-19 ENCOUNTER — Encounter: Payer: Self-pay | Admitting: Gerontology

## 2019-05-19 ENCOUNTER — Other Ambulatory Visit: Payer: Self-pay

## 2019-05-19 ENCOUNTER — Ambulatory Visit: Payer: Self-pay | Admitting: Gerontology

## 2019-05-19 VITALS — BP 131/81 | HR 91 | Wt 193.2 lb

## 2019-05-19 DIAGNOSIS — E782 Mixed hyperlipidemia: Secondary | ICD-10-CM

## 2019-05-19 DIAGNOSIS — E119 Type 2 diabetes mellitus without complications: Secondary | ICD-10-CM

## 2019-05-19 DIAGNOSIS — R030 Elevated blood-pressure reading, without diagnosis of hypertension: Secondary | ICD-10-CM

## 2019-05-19 MED ORDER — MIRTAZAPINE 7.5 MG PO TABS: 7.5000 mg | ORAL_TABLET | Freq: Every day | ORAL | 0 refills | Status: DC

## 2019-05-19 MED ORDER — ATORVASTATIN CALCIUM 20 MG PO TABS: ORAL_TABLET | ORAL | 0 refills | Status: DC

## 2019-05-19 NOTE — Patient Instructions (Signed)
Fat and Cholesterol Restricted Eating Plan Getting too much fat and cholesterol in your diet may cause health problems. Choosing the right foods helps keep your fat and cholesterol at normal levels. This can keep you from getting certain diseases. Your doctor may recommend an eating plan that includes:  Total fat: ______% or less of total calories a day.  Saturated fat: ______% or less of total calories a day.  Cholesterol: less than _________mg a day.  Fiber: ______g a day. What are tips for following this plan? Meal planning  At meals, divide your plate into four equal parts: ? Fill one-half of your plate with vegetables and green salads. ? Fill one-fourth of your plate with whole grains. ? Fill one-fourth of your plate with low-fat (lean) protein foods.  Eat fish that is high in omega-3 fats at least two times a week. This includes mackerel, tuna, sardines, and salmon.  Eat foods that are high in fiber, such as whole grains, beans, apples, broccoli, carrots, peas, and barley. General tips   Work with your doctor to lose weight if you need to.  Avoid: ? Foods with added sugar. ? Fried foods. ? Foods with partially hydrogenated oils.  Limit alcohol intake to no more than 1 drink a day for nonpregnant women and 2 drinks a day for men. One drink equals 12 oz of beer, 5 oz of wine, or 1 oz of hard liquor. Reading food labels  Check food labels for: ? Trans fats. ? Partially hydrogenated oils. ? Saturated fat (g) in each serving. ? Cholesterol (mg) in each serving. ? Fiber (g) in each serving.  Choose foods with healthy fats, such as: ? Monounsaturated fats. ? Polyunsaturated fats. ? Omega-3 fats.  Choose grain products that have whole grains. Look for the word "whole" as the first word in the ingredient list. Cooking  Cook foods using low-fat methods. These include baking, boiling, grilling, and broiling.  Eat more home-cooked foods. Eat at restaurants and buffets  less often.  Avoid cooking using saturated fats, such as butter, cream, palm oil, palm kernel oil, and coconut oil. Recommended foods  Fruits  All fresh, canned (in natural juice), or frozen fruits. Vegetables  Fresh or frozen vegetables (raw, steamed, roasted, or grilled). Green salads. Grains  Whole grains, such as whole wheat or whole grain breads, crackers, cereals, and pasta. Unsweetened oatmeal, bulgur, barley, quinoa, or brown rice. Corn or whole wheat flour tortillas. Meats and other protein foods  Ground beef (85% or leaner), grass-fed beef, or beef trimmed of fat. Skinless chicken or turkey. Ground chicken or turkey. Pork trimmed of fat. All fish and seafood. Egg whites. Dried beans, peas, or lentils. Unsalted nuts or seeds. Unsalted canned beans. Nut butters without added sugar or oil. Dairy  Low-fat or nonfat dairy products, such as skim or 1% milk, 2% or reduced-fat cheeses, low-fat and fat-free ricotta or cottage cheese, or plain low-fat and nonfat yogurt. Fats and oils  Tub margarine without trans fats. Light or reduced-fat mayonnaise and salad dressings. Avocado. Olive, canola, sesame, or safflower oils. The items listed above may not be a complete list of foods and beverages you can eat. Contact a dietitian for more information. Foods to avoid Fruits  Canned fruit in heavy syrup. Fruit in cream or butter sauce. Fried fruit. Vegetables  Vegetables cooked in cheese, cream, or butter sauce. Fried vegetables. Grains  White bread. White pasta. White rice. Cornbread. Bagels, pastries, and croissants. Crackers and snack foods that contain trans fat   and hydrogenated oils. Meats and other protein foods  Fatty cuts of meat. Ribs, chicken wings, bacon, sausage, bologna, salami, chitterlings, fatback, hot dogs, bratwurst, and packaged lunch meats. Liver and organ meats. Whole eggs and egg yolks. Chicken and [place] with skin. Fried meat. Dairy  Whole or 2% milk, cream,  half-and-half, and cream cheese. Whole milk cheeses. Whole-fat or sweetened yogurt. Full-fat cheeses. Nondairy creamers and whipped toppings. Processed cheese, cheese spreads, and cheese curds. Beverages  Alcohol. Sugar-sweetened drinks such as sodas, lemonade, and fruit drinks. Fats and oils  Butter, stick margarine, lard, shortening, ghee, or bacon fat. Coconut, palm kernel, and palm oils. Sweets and desserts  Corn syrup, sugars, honey, and molasses. Candy. Jam and jelly. Syrup. Sweetened cereals. Cookies, pies, cakes, donuts, muffins, and ice cream. The items listed above may not be a complete list of foods and beverages you should avoid. Contact a dietitian for more information. Summary  Choosing the right foods helps keep your fat and cholesterol at normal levels. This can keep you from getting certain diseases.  At meals, fill one-half of your plate with vegetables and green salads.  Eat high-fiber foods, like whole grains, beans, apples, carrots, peas, and barley.  Limit added sugar, saturated fats, alcohol, and fried foods. This information is not intended to replace advice given to you by your health care provider. Make sure you discuss any questions you have with your health care provider. Document Released: 03/18/2012 Document Revised: 05/21/2018 Document Reviewed: 06/04/2017 Elsevier Patient Education  [place] DASH stands for "Dietary Approaches to Stop Hypertension." The DASH eating plan is a healthy eating plan that has been shown to reduce high blood pressure (hypertension). It may also reduce your risk for type 2 diabetes, heart disease, and stroke. The DASH eating plan may also help with weight loss. What are tips for following this plan?  General guidelines  Avoid eating more than 2,300 mg (milligrams) of salt (sodium) a day. If you have hypertension, you may need to reduce your sodium intake to 1,500 mg a day.  Limit alcohol intake to no  more than 1 drink a day for nonpregnant women and 2 drinks a day for men. One drink equals 12 oz of beer, 5 oz of wine, or 1 oz of hard liquor.  Work with your health care provider to maintain a healthy body weight or to lose weight. Ask what an ideal weight is for you.  Get at least 30 minutes of exercise that causes your heart to beat faster (aerobic exercise) most days of the week. Activities may include walking, swimming, or biking.  Work with your health care provider or diet and nutrition specialist (dietitian) to adjust your eating plan to your individual calorie needs. Reading food labels   Check food labels for the amount of sodium per serving. Choose foods with less than 5 percent of the Daily Value of sodium. Generally, foods with less than 300 mg of sodium per serving fit into this eating plan.  To find whole grains, look for the word "whole" as the first word in the ingredient list. Shopping  Buy products labeled as "low-sodium" or "no salt added."  Buy fresh foods. Avoid canned foods and premade or frozen meals. Cooking  Avoid adding salt when cooking. Use salt-free seasonings or herbs instead of table salt or sea salt. Check with your health care provider or pharmacist before using salt substitutes.  Do not fry foods. Cook foods using healthy methods such as  baking, boiling, grilling, and broiling instead.  Cook with heart-healthy oils, such as olive, canola, soybean, or sunflower oil. Meal planning  Eat a balanced diet that includes: ? 5 or more servings of fruits and vegetables each day. At each meal, try to fill half of your plate with fruits and vegetables. ? Up to 6-8 servings of whole grains each day. ? Less than 6 oz of lean meat, poultry, or fish each day. A 3-oz serving of meat is about the same size as a deck of cards. One egg equals 1 oz. ? 2 servings of low-fat dairy each day. ? A serving of nuts, seeds, or beans 5 times each week. ? Heart-healthy fats.  Healthy fats called Omega-3 fatty acids are found in foods such as flaxseeds and coldwater fish, like sardines, salmon, and mackerel.  Limit how much you eat of the following: ? Canned or prepackaged foods. ? Food that is high in trans fat, such as fried foods. ? Food that is high in saturated fat, such as fatty meat. ? Sweets, desserts, sugary drinks, and other foods with added sugar. ? Full-fat dairy products.  Do not salt foods before eating.  Try to eat at least 2 vegetarian meals each week.  Eat more home-cooked food and less restaurant, buffet, and fast food.  When eating at a restaurant, ask that your food be prepared with less salt or no salt, if possible. What foods are recommended? The items listed may not be a complete list. Talk with your dietitian about what dietary choices are best for you. Grains Whole-grain or whole-wheat bread. Whole-grain or whole-wheat pasta. Brown rice. [place]. Bulgur. Whole-grain and low-sodium cereals. Pita bread. Low-fat, low-sodium crackers. Whole-wheat flour tortillas. Vegetables Fresh or frozen vegetables (raw, steamed, roasted, or grilled). Low-sodium or reduced-sodium tomato and vegetable juice. Low-sodium or reduced-sodium tomato sauce and tomato paste. Low-sodium or reduced-sodium canned vegetables. Fruits All fresh, dried, or frozen fruit. Canned fruit in natural juice (without added sugar). Meat and other protein foods Skinless chicken or [place]. Ground chicken or [place]. Pork with fat trimmed off. Fish and seafood. Egg whites. Dried beans, peas, or lentils. Unsalted nuts, nut butters, and seeds. Unsalted canned beans. Lean cuts of beef with fat trimmed off. Low-sodium, lean deli meat. Dairy Low-fat (1%) or fat-free (skim) milk. Fat-free, low-fat, or reduced-fat cheeses. Nonfat, low-sodium ricotta or cottage cheese. Low-fat or nonfat yogurt. Low-fat, low-sodium cheese. Fats and oils Soft margarine without trans fats. Vegetable  oil. Low-fat, reduced-fat, or light mayonnaise and salad dressings (reduced-sodium). Canola, safflower, olive, soybean, and sunflower oils. Avocado. Seasoning and other foods Herbs. Spices. Seasoning mixes without salt. Unsalted popcorn and pretzels. Fat-free sweets. What foods are not recommended? The items listed may not be a complete list. Talk with your dietitian about what dietary choices are best for you. Grains Baked goods made with fat, such as croissants, muffins, or some breads. Dry pasta or rice meal packs. Vegetables Creamed or fried vegetables. Vegetables in a cheese sauce. Regular canned vegetables (not low-sodium or reduced-sodium). Regular canned tomato sauce and paste (not low-sodium or reduced-sodium). Regular tomato and vegetable juice (not low-sodium or reduced-sodium). Angie Fava. Olives. Fruits Canned fruit in a light or heavy syrup. Fried fruit. Fruit in cream or butter sauce. Meat and other protein foods Fatty cuts of meat. Ribs. Fried meat. Berniece Salines. Sausage. Bologna and other processed lunch meats. Salami. Fatback. Hotdogs. Bratwurst. Salted nuts and seeds. Canned beans with added salt. Canned or smoked fish. Whole eggs or egg yolks.  Chicken or [place] with skin. Dairy Whole or 2% milk, cream, and half-and-half. Whole or full-fat cream cheese. Whole-fat or sweetened yogurt. Full-fat cheese. Nondairy creamers. Whipped toppings. Processed cheese and cheese spreads. Fats and oils Butter. Stick margarine. Lard. Shortening. Ghee. Bacon fat. Tropical oils, such as coconut, palm kernel, or palm oil. Seasoning and other foods Salted popcorn and pretzels. Onion salt, garlic salt, seasoned salt, table salt, and sea salt. Worcestershire sauce. Tartar sauce. Barbecue sauce. Teriyaki sauce. Soy sauce, including reduced-sodium. Steak sauce. Canned and packaged gravies. Fish sauce. Oyster sauce. Cocktail sauce. Horseradish that you find on the shelf. Ketchup. Mustard. Meat flavorings and  tenderizers. Bouillon cubes. Hot sauce and Tabasco sauce. Premade or packaged marinades. Premade or packaged taco seasonings. Relishes. Regular salad dressings. Where to find more information:  National Heart, Lung, and [place]: https://wilson-eaton.com/  American Heart Association: www.heart.org Summary  The DASH eating plan is a healthy eating plan that has been shown to reduce high blood pressure (hypertension). It may also reduce your risk for type 2 diabetes, heart disease, and stroke.  With the DASH eating plan, you should limit salt (sodium) intake to 2,300 mg a day. If you have hypertension, you may need to reduce your sodium intake to 1,500 mg a day.  When on the DASH eating plan, aim to eat more fresh fruits and vegetables, whole grains, lean proteins, low-fat dairy, and heart-healthy fats.  Work with your health care provider or diet and nutrition specialist (dietitian) to adjust your eating plan to your individual calorie needs. This information is not intended to replace advice given to you by your health care provider. Make sure you discuss any questions you have with your health care provider. Document Released: 09/06/2011 Document Revised: 08/30/2017 Document Reviewed: 09/10/2016 Elsevier Patient Education  2020 [place] for Diabetes Mellitus, Adult  Carbohydrate counting is a method of keeping track of how many carbohydrates you eat. Eating carbohydrates naturally increases the amount of sugar (glucose) in the blood. Counting how many carbohydrates you eat helps keep your blood glucose within normal limits, which helps you manage your diabetes (diabetes mellitus). It is important to know how many carbohydrates you can safely have in each meal. This is different for every person. A diet and nutrition specialist (registered dietitian) can help you make a meal plan and calculate how many carbohydrates you should have at each meal and snack.  Carbohydrates are found in the following foods:  Grains, such as breads and cereals.  Dried beans and soy products.  Starchy vegetables, such as potatoes, peas, and corn.  Fruit and fruit juices.  Milk and yogurt.  Sweets and snack foods, such as cake, cookies, candy, chips, and soft drinks. How do I count carbohydrates? There are two ways to count carbohydrates in food. You can use either of the methods or a combination of both. Reading "Nutrition Facts" on packaged food The "Nutrition Facts" list is included on the labels of almost all packaged foods and beverages in the [place] It includes:  The serving size.  Information about nutrients in each serving, including the grams (g) of carbohydrate per serving. To use the "Nutrition Facts":  Decide how many servings you will have.  Multiply the number of servings by the number of carbohydrates per serving.  The resulting number is the total amount of carbohydrates that you will be having. Learning standard serving sizes of other foods When you eat carbohydrate foods that are not packaged or do not include "Nutrition  Facts" on the label, you need to measure the servings in order to count the amount of carbohydrates:  Measure the foods that you will eat with a food scale or measuring cup, if needed.  Decide how many standard-size servings you will eat.  Multiply the number of servings by 15. Most carbohydrate-rich foods have about 15 g of carbohydrates per serving. ? For example, if you eat 8 oz (170 g) of strawberries, you will have eaten 2 servings and 30 g of carbohydrates (2 servings x 15 g = 30 g).  For foods that have more than one food mixed, such as soups and casseroles, you must count the carbohydrates in each food that is included. The following list contains standard serving sizes of common carbohydrate-rich foods. Each of these servings has about 15 g of carbohydrates:   hamburger bun or  English muffin.   oz (15  mL) syrup.   oz (14 g) jelly.  1 slice of bread.  1 six-inch tortilla.  3 oz (85 g) cooked rice or pasta.  4 oz (113 g) cooked dried beans.  4 oz (113 g) starchy vegetable, such as peas, corn, or potatoes.  4 oz (113 g) hot cereal.  4 oz (113 g) mashed potatoes or  of a large baked potato.  4 oz (113 g) canned or frozen fruit.  4 oz (120 mL) fruit juice.  4-6 crackers.  6 chicken nuggets.  6 oz (170 g) unsweetened dry cereal.  6 oz (170 g) plain fat-free yogurt or yogurt sweetened with artificial sweeteners.  8 oz (240 mL) milk.  8 oz (170 g) fresh fruit or one small piece of fruit.  24 oz (680 g) popped popcorn. Example of carbohydrate counting Sample meal  3 oz (85 g) chicken breast.  6 oz (170 g) brown rice.  4 oz (113 g) corn.  8 oz (240 mL) milk.  8 oz (170 g) strawberries with sugar-free whipped topping. Carbohydrate calculation 1. Identify the foods that contain carbohydrates: ? Rice. ? Corn. ? Milk. ? Strawberries. 2. Calculate how many servings you have of each food: ? 2 servings rice. ? 1 serving corn. ? 1 serving milk. ? 1 serving strawberries. 3. Multiply each number of servings by 15 g: ? 2 servings rice x 15 g = 30 g. ? 1 serving corn x 15 g = 15 g. ? 1 serving milk x 15 g = 15 g. ? 1 serving strawberries x 15 g = 15 g. 4. Add together all of the amounts to find the total grams of carbohydrates eaten: ? 30 g + 15 g + 15 g + 15 g = 75 g of carbohydrates total. Summary  Carbohydrate counting is a method of keeping track of how many carbohydrates you eat.  Eating carbohydrates naturally increases the amount of sugar (glucose) in the blood.  Counting how many carbohydrates you eat helps keep your blood glucose within normal limits, which helps you manage your diabetes.  A diet and nutrition specialist (registered dietitian) can help you make a meal plan and calculate how many carbohydrates you should have at each meal and snack.  This information is not intended to replace advice given to you by your health care provider. Make sure you discuss any questions you have with your health care provider. Document Released: 09/17/2005 Document Revised: 04/11/2017 Document Reviewed: 02/29/2016 Elsevier Patient Education  2020 [employer].

## 2019-05-19 NOTE — Progress Notes (Signed)
Established Patient Office Visit  Subjective:  Patient ID: Natalie Frey, female    DOB: 23-Aug-1956  Age: 63 y.o. MRN: [id number]  CC:  Chief Complaint  Patient presents with  . Follow-up    HPI Natalie Frey presents for follow up of hypertension, type 2 diabetes, and medication refill. She brought her 14 day blood pressure log and her SBP ranges between 105-140 and DBP is between 68-100. She denies chest pain, palpitation, light headedness, admits to adhering to low sodium diet. She reports checking her fasting blood glucose and it ranges between 110-129, and she continues to comply with low carbohydrate/ non concentrated sweet diet. She denies hypoglycemic/hyperglycemic symptoms. She continues to follow up at [facility] clinic for physical therapy, continues to ambulate with walker, and uses nicotine patch. Otherwise she reports that she's doing well, denies chest pain, palpitation, light headedness, myalgia, muscle weakness, paresthesia, fever, chills and no further complaint.  Past Medical History:  Diagnosis Date  . Cancer ([place])   . Dvt femoral (deep venous thrombosis) ([place])   . Hyperlipidemia     Past Surgical History:  Procedure Laterality Date  . BLADDER SURGERY    . DILATION AND EVACUATION      Family History  Problem Relation Age of Onset  . Deep vein thrombosis Daughter         when pregnant   . Pulmonary embolism Sister        while in hospital; other sister- on BCPs.     Social History   Socioeconomic History  . Marital status: Widowed    Spouse name: Not on file  . Number of children: Not on file  . Years of education: Not on file  . Highest education level: Not on file  Occupational History  . Not on file  Social Needs  . Financial resource strain: Not hard at all  . Food insecurity    Worry: Never true    Inability: Never true  . Transportation needs    Medical: No    Non-medical: No  Tobacco Use  . Smoking status: Current Every Day Smoker    Packs/day:  1.25    Years: 47.00    Pack years: 58.75    Types: Cigarettes  . Smokeless tobacco: Never Used  Substance and Sexual Activity  . Alcohol use: No  . Drug use: Yes    Types: Marijuana  . Sexual activity: Not on file  Lifestyle  . Physical activity    Days per week: 6 days    Minutes per session: 40 min  . Stress: Not at all  Relationships  . Social connections    Talks on phone: More than three times a week    Gets together: Three times a week    Attends religious service: Never    Active member of club or organization: No    Attends meetings of clubs or organizations: Never    Relationship status: Living with partner  . Intimate partner violence    Fear of current or ex partner: No    Emotionally abused: No    Physically abused: No    Forced sexual activity: No  Other Topics Concern  . Not on file  Social History Narrative   [place]; smoking; 1ppd/cutting down. Social. Worked for [employer] from [place].       Said she has been doing ok and does not need any assistance          - is currently on food stamps  but those have been enough       NO consent form signed     Outpatient Medications Prior to Visit  Medication Sig Dispense Refill  . acetaminophen (TYLENOL) 325 MG tablet Take 2 tablets (650 mg total) by mouth every 6 (six) hours as needed for mild pain (or Fever >/= 101).    Marland Kitchen amphetamine-dextroamphetamine (ADDERALL) 30 MG tablet Take 1 tablet by mouth daily.    Marland Kitchen aspirin EC 81 MG tablet Take 81 mg by mouth daily.    . mirtazapine (REMERON) 7.5 MG tablet Take 1 tablet (7.5 mg total) by mouth at bedtime. 30 tablet 0  . traMADol (ULTRAM) 50 MG tablet Take 1 tablet by mouth every 8 (eight) hours.    Marland Kitchen atorvastatin (LIPITOR) 20 MG tablet TAKE 1 TABLET (20 MG) BY MOUTH EVERY DAY 90 tablet 0   No facility-administered medications prior to visit.     No Known Allergies  ROS Review of Systems  Constitutional: Negative.   HENT: Negative.   Respiratory:  Negative.   Cardiovascular: Negative.   Endocrine: Negative.   Genitourinary: Negative.   Skin: Negative.   Neurological: Negative.   Psychiatric/Behavioral: Negative.       Objective:    Physical Exam  Constitutional: She is oriented to person, place, and time. She appears well-developed and well-nourished.  HENT:  Head: Normocephalic and atraumatic.  Eyes: Pupils are equal, round, and reactive to light. EOM are normal.  Neck: Normal range of motion.  Cardiovascular: Normal rate and regular rhythm.  Pulmonary/Chest: Effort normal and breath sounds normal.  Abdominal: Soft. Bowel sounds are normal.  Musculoskeletal: Normal range of motion.  Neurological: She is alert and oriented to person, place, and time. She has normal reflexes.  Skin: Skin is warm and dry.  Psychiatric: She has a normal mood and affect. Her behavior is normal. Judgment and thought content normal.    BP 131/81 (BP Location: Left Arm, Patient Position: Sitting, Cuff Size: Large)   Pulse 91   Wt 193 lb 3.2 oz (87.6 kg)   SpO2 98%   BMI 33.16 kg/m  Wt Readings from Last 3 Encounters:  05/19/19 193 lb 3.2 oz (87.6 kg)  05/14/19 190 lb (86.2 kg)  04/27/19 194 lb (88 kg)   She was advised to continue on weight loss regimen.  Health Maintenance Due  Topic Date Due  . Hepatitis C Screening  09/23/1956  . PNEUMOCOCCAL POLYSACCHARIDE VACCINE AGE 20-64 HIGH RISK  09/02/1960  . FOOT EXAM  09/02/1968  . OPHTHALMOLOGY EXAM  09/02/1968  . URINE MICROALBUMIN  09/02/1968  . TETANUS/TDAP  09/02/1977  . PAP SMEAR-Modifier  09/03/1979  . MAMMOGRAM  09/02/2008  . COLONOSCOPY  09/02/2008  . INFLUENZA VACCINE  05/02/2019    There are no preventive care reminders to display for this patient.  Lab Results  Component Value Date   TSH 1.840 04/17/2018   Lab Results  Component Value Date   WBC 10.5 04/27/2019   HGB 14.1 04/27/2019   HCT 42.3 04/27/2019   MCV 95.3 04/27/2019   PLT 267 04/27/2019   Lab Results   Component Value Date   NA 139 04/27/2019   K 3.7 04/27/2019   CO2 20 (L) 04/27/2019   GLUCOSE 143 (H) 04/27/2019   BUN 10 04/27/2019   CREATININE 0.58 04/27/2019   BILITOT 0.8 04/09/2019   ALKPHOS 63 04/09/2019   AST 19 04/09/2019   ALT 16 04/09/2019   PROT 7.6 04/09/2019   ALBUMIN 4.1 04/09/2019  CALCIUM 8.9 04/27/2019   ANIONGAP 10 04/27/2019   Lab Results  Component Value Date   CHOL 165 03/11/2019   Lab Results  Component Value Date   HDL 36 (L) 03/11/2019   Lab Results  Component Value Date   LDLCALC 91 03/11/2019   Lab Results  Component Value Date   TRIG 188 (H) 03/11/2019   Lab Results  Component Value Date   CHOLHDL 4.6 (H) 03/11/2019   Lab Results  Component Value Date   HGBA1C 6.6 (H) 03/11/2019      Assessment & Plan:   1. Mixed hyperlipidemia - She will continue on current treatment regimen, -Low fat Diet, like low fat dairy products eg skimmed milk -Avoid any fried food -Regular exercise/walk -Goal for Total Cholesterol is less than 200 -Goal for bad cholesterol LDL is less than 100 -Goal for Good cholesterol HDL is more than 45 -Goal for Triglyceride is less than 150 - atorvastatin (LIPITOR) 20 MG tablet; TAKE 1 TABLET (20 MG) BY MOUTH EVERY DAY  Dispense: 90 tablet; Refill: 0 - Lipid panel; Future  2. Type 2 diabetes mellitus without complication, without long-term current use of insulin ([place]) - She was advised to continue checking her fasting blood glucose and bring log to office visit. Her last HgbA1c was 6.6% and she discontinued Metformin. Will recheck A1c in 1 month -Use Diabetic diet as advised  -Regular exercise - HgB A1c; Future  3. Elevated blood pressure reading - Her blood pressure is controlled and her goal is < 150/90. She will continue to check , record and bring blood pressure log to office visit. She was advised to continue on low salt diet, increase water intake, exercise as tolerated.     Follow-up: Return in  about 5 weeks (around 06/23/2019), or if symptoms worsen or fail to improve.    Kainen Struckman Jerold Coombe, NP

## 2019-05-21 ENCOUNTER — Other Ambulatory Visit: Payer: Self-pay | Admitting: Gerontology

## 2019-05-21 DIAGNOSIS — E782 Mixed hyperlipidemia: Secondary | ICD-10-CM

## 2019-05-21 MED ORDER — ATORVASTATIN CALCIUM 20 MG PO TABS: 80.0000 mg | ORAL_TABLET | Freq: Every day | ORAL | 3 refills | Status: DC

## 2019-05-28 ENCOUNTER — Ambulatory Visit: Payer: Self-pay | Admitting: Licensed Clinical Social Worker

## 2019-05-28 ENCOUNTER — Other Ambulatory Visit: Payer: Self-pay

## 2019-05-28 DIAGNOSIS — F4323 Adjustment disorder with mixed anxiety and depressed mood: Secondary | ICD-10-CM

## 2019-05-28 NOTE — BH Specialist Note (Signed)
Integrated Behavioral Health Follow Up Visit Via Phone  MRN: [id number] Name: Natalie Frey  Number of [place] Clinician visits: 1/6  Type of Service: [place] Interpretor:No. Interpretor Name and Language: Not applicable.  SUBJECTIVE: Natalie Frey is a 63 y.o. female accompanied by herself. Patient was referred by Carlyon Shadow, NP for mental health. Patient reports the following symptoms/concerns: She reports that she has been sleeping and is able to relax since she started the Mirtazapine 7.5 mg at bedtime. She notes that the pharmacist told her to cut the Mirtazapine in half so that's what she has been doing. She reports that she hasn't noticed any change in her anxiety since starting the Mirtazapine. She notes she is worried about not starting physical therapy because she is still dragging her left leg and balance is off. She describes having less crying spells. She notes that she is trying to get out of the house once a day and is socializing with the neighbors. She notes that she is walking some around [employer] and walking some around daughter's neighborhood with a walker. She denies suicidal and homicidal thoughts.  Duration of problem: ; Severity of problem: moderate  OBJECTIVE: Mood: Euthymic and Affect: Appropriate Risk of harm to self or others: No plan to harm self or others  LIFE CONTEXT: Family and Social: See above. School/Work: See above. Self-Care: See above. Life Changes: See above.  GOALS ADDRESSED: Patient will: 1.  Reduce symptoms of: anxiety  2.  Increase knowledge and/or ability of: self-management skills and stress reduction  3.  Demonstrate ability to: Increase healthy adjustment to current life circumstances  INTERVENTIONS: Interventions utilized:  Brief CBT was utilized by the clinician discussing the patient's anxiety and affect on normal cognition. Clinician processed with the patient regarding  how she has been doing since the last follow up session. Clinician asked the patient if she has noticed a difference in her symptoms of anxiety and/or depression since starting the Mirtazapine 7.5 mg at bedtime. Clinician explained to the patient that it sounds like she has been feeling less depressed, is able to sleep, and is still feeling anxious but its not as severe as it was prior to starting the medication. Clinician explained to the patient that despite the delay with starting physical therapy that she has been taking steps to improve her mobility and strength by walking with a walker and getting out of the house at least once a day. Clinician encouraged the patient to continue to socialize, stay physically active, and practice self care.  Standardized Assessments completed: GAD-7 and PHQ 9  ASSESSMENT: Patient currently experiencing see above.   Patient may benefit from see above.  PLAN: 1. Follow up with behavioral health clinician on : two weeks or earlier if needed.  2. Behavioral recommendations: see above. 3. Referral(s): [place] (In Clinic) 4. "From scale of 1-10, how likely are you to follow plan?":   Bayard Hugger, LCSW

## 2019-06-03 ENCOUNTER — Other Ambulatory Visit: Payer: Self-pay

## 2019-06-03 MED ORDER — NICOTINE 21 MG/24HR TD PT24: 21.0000 mg | MEDICATED_PATCH | Freq: Every day | TRANSDERMAL | 0 refills | Status: DC

## 2019-06-10 ENCOUNTER — Other Ambulatory Visit: Payer: Self-pay

## 2019-06-11 ENCOUNTER — Other Ambulatory Visit: Payer: Self-pay | Admitting: Gerontology

## 2019-06-11 ENCOUNTER — Ambulatory Visit: Payer: Self-pay | Admitting: Licensed Clinical Social Worker

## 2019-06-11 ENCOUNTER — Other Ambulatory Visit: Payer: Self-pay

## 2019-06-11 DIAGNOSIS — F418 Other specified anxiety disorders: Secondary | ICD-10-CM

## 2019-06-11 DIAGNOSIS — F4323 Adjustment disorder with mixed anxiety and depressed mood: Secondary | ICD-10-CM

## 2019-06-11 MED ORDER — MIRTAZAPINE 7.5 MG PO TABS: 7.5000 mg | ORAL_TABLET | Freq: Every day | ORAL | 0 refills | Status: DC

## 2019-06-11 NOTE — BH Specialist Note (Signed)
Integrated Behavioral Health Follow Up Visit Via Phone  MRN: [id number] Name: Keiasia Choice  Number of [place] Clinician visits: 2/6  Type of Service: [place] Interpretor:No. Interpretor Name and Language: Not applicable.  SUBJECTIVE: Monzerrat Hubanks is a 63 y.o. female accompanied by herself. Patient was referred by Carlyon Shadow NP for mental health. Patient reports the following symptoms/concerns: She explains that she has been feeling more stressed due to her boyfriend packing up his stuff and leaving all of a sudden. She explains that her boyfriend did not give her any explanation that he just left. She notes that he is not going to pay her bills or help her anymore. She reports that her mobility is a lot better and no longer has to depend on her boyfriend to take her places. She notes that she has been getting a lot of walking in even though sometimes getting in and out of the car is a chore. She notes that she has a close neighbor and her daughter lives in [place]. She reports that she talks to her sisters on the phone or text everyday. She explains that she is worried in terms of finances. She notes that physical therapy keeps calling her and telling her that she is on the list. She asked if it was possible to increase the dosage of Mirtazapine due to experiencing so much stress in the last two weeks. She denies suicidal and homicidal thoughts.  Duration of problem: ; Severity of problem: moderate  OBJECTIVE: Mood: Euthymic and Affect: Appropriate Risk of harm to self or others: No plan to harm self or others  LIFE CONTEXT: Family and Social: see above. School/Work: see above. Self-Care: see above. Life Changes: see above.   GOALS ADDRESSED: Patient will: 1.  Reduce symptoms of: anxiety  2.  Increase knowledge and/or ability of: coping skills and stress reduction  3.  Demonstrate ability to: Increase healthy adjustment to  current life circumstances  INTERVENTIONS: Interventions utilized:  Supportive Counseling was utilized by the clinician during today's follow up session. Clinician processed with the patient regarding how she has been doing since the last follow up session. Clinician explained to the patient that she knows while she is hurting right now this gives her the opportunity to focus on herself and continue to work on strengthening her mobility. Clinician encouraged the patient to listen to her body in terms of what she is capable of doing versus what she is not. Clinician suggested that the patient either file for disability or look into getting a job when she is physically able to again. Clinician encouraged the patient to continue to lean on the people in her support system. Clinician explained to the patient that she will have a case consultation with Dr. Octavia Heir, MD, psychiatric consultant on Tuesday at 9 am to discus the possibility of increasing the dosage of Mirtazapine.  Standardized Assessments completed: GAD-7 and PHQ 9  ASSESSMENT: Patient currently experiencing see above.   Patient may benefit from see above.  PLAN: 1. Follow up with behavioral health clinician on : two weeks or earlier if needed. 2. Behavioral recommendations: Case consultation with Dr. Octavia Heir, MD, psychiatric consultant on Tuesday September 15 at 9 am.  3. Referral(s): [place] (In Clinic) 4. "From scale of 1-10, how likely are you to follow plan?":   Bayard Hugger, LCSW

## 2019-06-16 ENCOUNTER — Other Ambulatory Visit: Payer: Self-pay

## 2019-06-16 DIAGNOSIS — F418 Other specified anxiety disorders: Secondary | ICD-10-CM

## 2019-06-16 MED ORDER — MIRTAZAPINE 15 MG PO TABS: 15.0000 mg | ORAL_TABLET | Freq: Every day | ORAL | 0 refills | Status: DC

## 2019-06-17 ENCOUNTER — Other Ambulatory Visit: Payer: Self-pay

## 2019-06-23 ENCOUNTER — Ambulatory Visit: Payer: Self-pay | Admitting: Gerontology

## 2019-06-24 ENCOUNTER — Other Ambulatory Visit: Payer: Self-pay

## 2019-06-24 DIAGNOSIS — E782 Mixed hyperlipidemia: Secondary | ICD-10-CM

## 2019-06-24 DIAGNOSIS — E119 Type 2 diabetes mellitus without complications: Secondary | ICD-10-CM

## 2019-06-24 DIAGNOSIS — Z0000 Encounter for general adult medical examination without abnormal findings: Secondary | ICD-10-CM

## 2019-06-24 DIAGNOSIS — R030 Elevated blood-pressure reading, without diagnosis of hypertension: Secondary | ICD-10-CM

## 2019-06-25 ENCOUNTER — Ambulatory Visit: Payer: Self-pay | Admitting: Gerontology

## 2019-06-25 ENCOUNTER — Encounter: Payer: Self-pay | Admitting: Gerontology

## 2019-06-25 ENCOUNTER — Ambulatory Visit: Payer: Self-pay | Admitting: Licensed Clinical Social Worker

## 2019-06-25 VITALS — BP 141/81 | HR 81 | Temp 96.7°F | Ht 64.0 in | Wt 194.0 lb

## 2019-06-25 DIAGNOSIS — E119 Type 2 diabetes mellitus without complications: Secondary | ICD-10-CM

## 2019-06-25 DIAGNOSIS — Z0000 Encounter for general adult medical examination without abnormal findings: Secondary | ICD-10-CM

## 2019-06-25 DIAGNOSIS — F4323 Adjustment disorder with mixed anxiety and depressed mood: Secondary | ICD-10-CM

## 2019-06-25 LAB — MICROALBUMIN / CREATININE URINE RATIO
Creatinine, Urine: 60.1 mg/dL
Microalb/Creat Ratio: <5 mg/g creat (ref 0–29)
Microalbumin, Urine: <3 ug/mL

## 2019-06-25 LAB — COMPREHENSIVE METABOLIC PANEL
ALT: 21 IU/L (ref 0–32)
AST: 19 IU/L (ref 0–40)
Albumin/Globulin Ratio: 1.9 (ref 1.2–2.2)
Albumin: 4.7 g/dL (ref 3.8–4.9)
Alkaline Phosphatase: 77 IU/L (ref 39–117)
BUN/Creatinine Ratio: 21 (ref 12–28)
BUN: 16 mg/dL (ref 8–27)
Bilirubin Total: 0.8 mg/dL (ref 0.0–1.2)
CO2: 21 mmol/L (ref 20–29)
Calcium: 9.5 mg/dL (ref 8.7–10.3)
Chloride: 104 mmol/L (ref 96–106)
Creatinine, Ser: 0.76 mg/dL (ref 0.57–1.00)
GFR calc Af Amer: 99 mL/min/{1.73_m2} (ref >59)
GFR calc non Af Amer: 86 mL/min/{1.73_m2} (ref >59)
Globulin, Total: 2.5 g/dL (ref 1.5–4.5)
Glucose: 123 mg/dL — ABNORMAL HIGH (ref 65–99)
Potassium: 4.6 mmol/L (ref 3.5–5.2)
Sodium: 138 mmol/L (ref 134–144)
Total Protein: 7.2 g/dL (ref 6.0–8.5)

## 2019-06-25 LAB — CBC WITH DIFFERENTIAL/PLATELET
Basophils Absolute: 0 10*3/uL (ref 0.0–0.2)
Basos: 1 %
EOS (ABSOLUTE): 0.1 10*3/uL (ref 0.0–0.4)
Eos: 2 %
Hematocrit: 45.7 % (ref 34.0–46.6)
Hemoglobin: 15.7 g/dL (ref 11.1–15.9)
Immature Grans (Abs): 0 10*3/uL (ref 0.0–0.1)
Immature Granulocytes: 0 %
Lymphocytes Absolute: 3.4 10*3/uL — ABNORMAL HIGH (ref 0.7–3.1)
Lymphs: 43 %
MCH: 31.8 pg (ref 26.6–33.0)
MCHC: 34.4 g/dL (ref 31.5–35.7)
MCV: 93 fL (ref 79–97)
Monocytes Absolute: 0.5 10*3/uL (ref 0.1–0.9)
Monocytes: 6 %
Neutrophils Absolute: 3.9 10*3/uL (ref 1.4–7.0)
Neutrophils: 48 %
Platelets: 255 10*3/uL (ref 150–450)
RBC: 4.94 x10E6/uL (ref 3.77–5.28)
RDW: 12.4 % (ref 11.7–15.4)
WBC: 8 10*3/uL (ref 3.4–10.8)

## 2019-06-25 LAB — URINALYSIS
Bilirubin, UA: NEGATIVE
Glucose, UA: NEGATIVE
Ketones, UA: NEGATIVE
Leukocytes,UA: NEGATIVE
Nitrite, UA: NEGATIVE
Protein,UA: NEGATIVE
RBC, UA: NEGATIVE
Specific Gravity, UA: 1.012 (ref 1.005–1.030)
Urobilinogen, Ur: 0.2 mg/dL (ref 0.2–1.0)
pH, UA: 5.5 (ref 5.0–7.5)

## 2019-06-25 LAB — LIPID PANEL
Chol/HDL Ratio: 3.1 ratio (ref 0.0–4.4)
Cholesterol, Total: 129 mg/dL (ref 100–199)
HDL: 42 mg/dL (ref >39)
LDL Chol Calc (NIH): 63 mg/dL (ref 0–99)
Triglycerides: 140 mg/dL (ref 0–149)
VLDL Cholesterol Cal: 24 mg/dL (ref 5–40)

## 2019-06-25 LAB — HEMOGLOBIN A1C
Est. average glucose Bld gHb Est-mCnc: 146 mg/dL
Hgb A1c MFr Bld: 6.7 % — ABNORMAL HIGH (ref 4.8–5.6)

## 2019-06-25 MED ORDER — METFORMIN HCL 1000 MG PO TABS: 500.0000 mg | ORAL_TABLET | Freq: Every day | ORAL | 0 refills | Status: DC

## 2019-06-25 NOTE — Patient Instructions (Signed)
Carbohydrate Counting for Diabetes Mellitus, Adult  Carbohydrate counting is a method of keeping track of how many carbohydrates you eat. Eating carbohydrates naturally increases the amount of sugar (glucose) in the blood. Counting how many carbohydrates you eat helps keep your blood glucose within normal limits, which helps you manage your diabetes (diabetes mellitus). It is important to know how many carbohydrates you can safely have in each meal. This is different for every person. A diet and nutrition specialist (registered dietitian) can help you make a meal plan and calculate how many carbohydrates you should have at each meal and snack. Carbohydrates are found in the following foods:  Grains, such as breads and cereals.  Dried beans and soy products.  Starchy vegetables, such as potatoes, peas, and corn.  Fruit and fruit juices.  Milk and yogurt.  Sweets and snack foods, such as cake, cookies, candy, chips, and soft drinks. How do I count carbohydrates? There are two ways to count carbohydrates in food. You can use either of the methods or a combination of both. Reading "Nutrition Facts" on packaged food The "Nutrition Facts" list is included on the labels of almost all packaged foods and beverages in the [place] It includes:  The serving size.  Information about nutrients in each serving, including the grams (g) of carbohydrate per serving. To use the "Nutrition Facts":  Decide how many servings you will have.  Multiply the number of servings by the number of carbohydrates per serving.  The resulting number is the total amount of carbohydrates that you will be having. Learning standard serving sizes of other foods When you eat carbohydrate foods that are not packaged or do not include "Nutrition Facts" on the label, you need to measure the servings in order to count the amount of carbohydrates:  Measure the foods that you will eat with a food scale or measuring cup, if needed.   Decide how many standard-size servings you will eat.  Multiply the number of servings by 15. Most carbohydrate-rich foods have about 15 g of carbohydrates per serving. ? For example, if you eat 8 oz (170 g) of strawberries, you will have eaten 2 servings and 30 g of carbohydrates (2 servings x 15 g = 30 g).  For foods that have more than one food mixed, such as soups and casseroles, you must count the carbohydrates in each food that is included. The following list contains standard serving sizes of common carbohydrate-rich foods. Each of these servings has about 15 g of carbohydrates:   hamburger bun or  English muffin.   oz (15 mL) syrup.   oz (14 g) jelly.  1 slice of bread.  1 six-inch tortilla.  3 oz (85 g) cooked rice or pasta.  4 oz (113 g) cooked dried beans.  4 oz (113 g) starchy vegetable, such as peas, corn, or potatoes.  4 oz (113 g) hot cereal.  4 oz (113 g) mashed potatoes or  of a large baked potato.  4 oz (113 g) canned or frozen fruit.  4 oz (120 mL) fruit juice.  4-6 crackers.  6 chicken nuggets.  6 oz (170 g) unsweetened dry cereal.  6 oz (170 g) plain fat-free yogurt or yogurt sweetened with artificial sweeteners.  8 oz (240 mL) milk.  8 oz (170 g) fresh fruit or one small piece of fruit.  24 oz (680 g) popped popcorn. Example of carbohydrate counting Sample meal  3 oz (85 g) chicken breast.  6 oz (170 g)   brown rice.  4 oz (113 g) corn.  8 oz (240 mL) milk.  8 oz (170 g) strawberries with sugar-free whipped topping. Carbohydrate calculation 1. Identify the foods that contain carbohydrates: ? Rice. ? Corn. ? Milk. ? Strawberries. 2. Calculate how many servings you have of each food: ? 2 servings rice. ? 1 serving corn. ? 1 serving milk. ? 1 serving strawberries. 3. Multiply each number of servings by 15 g: ? 2 servings rice x 15 g = 30 g. ? 1 serving corn x 15 g = 15 g. ? 1 serving milk x 15 g = 15 g. ? 1 serving  strawberries x 15 g = 15 g. 4. Add together all of the amounts to find the total grams of carbohydrates eaten: ? 30 g + 15 g + 15 g + 15 g = 75 g of carbohydrates total. Summary  Carbohydrate counting is a method of keeping track of how many carbohydrates you eat.  Eating carbohydrates naturally increases the amount of sugar (glucose) in the blood.  Counting how many carbohydrates you eat helps keep your blood glucose within normal limits, which helps you manage your diabetes.  A diet and nutrition specialist (registered dietitian) can help you make a meal plan and calculate how many carbohydrates you should have at each meal and snack. This information is not intended to replace advice given to you by your health care provider. Make sure you discuss any questions you have with your health care provider. Document Released: 09/17/2005 Document Revised: 04/11/2017 Document Reviewed: 02/29/2016 Elsevier Patient Education  2020 Elsevier Inc.  

## 2019-06-25 NOTE — BH Specialist Note (Signed)
Integrated Behavioral Health Follow Up Visit Via Phone.  MRN: [id number] Name: Natalie Frey  Number of [place] Clinician visits: 3/6  Type of Service: [place] Interpretor:No. Interpretor Name and Language: Not applicable.   SUBJECTIVE: Natalie Frey is a 63 y.o. female accompanied by herself. Patient was referred by Carlyon Shadow NP for mental health. Patient reports the following symptoms/concerns: She reports that things are going well. She explains that she has been sleeping better and has noticed a difference since starting Mirtazapine 15 mg at bedtime. She notes that she has been trying to follow a diet and eat healthier. She explains that she is trying to make the best of a bad situation. She notes that she is trying to focus on her health and getting back to being 100 % again. She notes that she has a lot of energy in the morning and it fizzles out in the afternoon. She notes that she is learning to deal with the fact that her boyfriend left and has been able to accept it. She notes that each day that she gets a a little bit better. She notes that she is taking things one day at a time. She denies suicidal and homicidal thoughts.  Duration of problem: ; Severity of problem: mild  OBJECTIVE: Mood: Euthymic and Affect: Appropriate Risk of harm to self or others: No plan to harm self or others  LIFE CONTEXT: Family and Social: see above. School/Work: see above. Self-Care: see above. Life Changes: see above.  GOALS ADDRESSED: Patient will: 1.  Reduce symptoms of: anxiety  2.  Increase knowledge and/or ability of: coping skills and stress reduction  3.  Demonstrate ability to: Increase healthy adjustment to current life circumstances  INTERVENTIONS: Interventions utilized:  Supportive Counseling was utilized by the clinician focusing on the patient's improvement in her anxiety. Clinician processed with the patient regarding  how she has been doing since the last follow up session. Clinician asked the patient if she has noticed a difference in her symptoms of anxiety, depression, and insomnia since she started the increased dosage of the Mirtazapine. Clinician explained to the patient that it sounds like she is taking a lot of steps towards making a full recovery in terms of her health and mental health. Clinician explained to the patient that it sounds like she is slowly getting her mobility back where it needs to be. Clinician encouraged the patient to focus on the positives rather than negatives in her life.  Standardized Assessments completed: GAD-7 and PHQ 9  ASSESSMENT: Patient currently experiencingsee above.   Patient may benefit from see above.  PLAN: 1. Follow up with behavioral health clinician on : three weeks or earlier if needed.  2. Behavioral recommendations: see above. 3. Referral(s): [place] (In Clinic) 4. "From scale of 1-10, how likely are you to follow plan?":   Bayard Hugger, LCSW

## 2019-06-25 NOTE — Progress Notes (Signed)
Established Patient Office Visit  Subjective:  Patient ID: Natalie Frey, female    DOB: [birth year]  Age: 63 y.o. MRN: [id number]  CC: No chief complaint on file.   HPI Natalie Frey presents for follow up of type 2 diabetes and lab review. Her HgbA1c done yesterday was 6.7%, and she stopped taking metformin because of GI symptoms. She checks her fasting blood glucose daily ,and it ranges between 104-122 mg/dl. She denies hypoglycemic/hyperglycemic symptoms, and  performs daily foot checks. Also she checks her blood pressure at home and her SBP  Is between 118-138 and DBP is between 67-78. She states that she has modified her diet and her Lipid panel done 2 days ago was within normal limits. She continues to use nicotine patch and she's waiting for Physical therapy appointment from [facility]. She reports that her boyfriend left her few weeks ago, but she's coping well and denies any suicidal or homicidal ideation. She states that she has a great support system. She denies chest pain, fever, chills, myalgia and offers no further complaint.  Past Medical History:  Diagnosis Date  . Cancer ([place])   . Dvt femoral (deep venous thrombosis) ([place])   . Hyperlipidemia     Past Surgical History:  Procedure Laterality Date  . BLADDER SURGERY    . DILATION AND EVACUATION      Family History  Problem Relation Age of Onset  . Deep vein thrombosis Daughter         when pregnant   . Pulmonary embolism Sister        while in hospital; other sister- on BCPs.     Social History   Socioeconomic History  . Marital status: Widowed    Spouse name: Not on file  . Number of children: Not on file  . Years of education: Not on file  . Highest education level: Not on file  Occupational History  . Not on file  Social Needs  . Financial resource strain: Not hard at all  . Food insecurity    Worry: Never true    Inability: Never true  . Transportation needs    Medical: No    Non-medical: No  Tobacco Use   . Smoking status: Former Smoker    Packs/day: 1.25    Years: 47.00    Pack years: 58.75    Types: Cigarettes  . Smokeless tobacco: Never Used  Substance and Sexual Activity  . Alcohol use: No  . Drug use: Yes    Types: Marijuana  . Sexual activity: Not on file  Lifestyle  . Physical activity    Days per week: 6 days    Minutes per session: 40 min  . Stress: Not at all  Relationships  . Social connections    Talks on phone: More than three times a week    Gets together: Three times a week    Attends religious service: Never    Active member of club or organization: No    Attends meetings of clubs or organizations: Never    Relationship status: Living with partner  . Intimate partner violence    Fear of current or ex partner: No    Emotionally abused: No    Physically abused: No    Forced sexual activity: No  Other Topics Concern  . Not on file  Social History Narrative   [place]; smoking; 1ppd/cutting down. Social. Worked for [employer] from [place].       Said she has been  doing ok and does not need any assistance          - is currently on food stamps but those have been enough       NO consent form signed     Outpatient Medications Prior to Visit  Medication Sig Dispense Refill  . aspirin EC 81 MG tablet Take 81 mg by mouth daily.    Marland Kitchen atorvastatin (LIPITOR) 20 MG tablet Take 4 tablets (80 mg total) by mouth daily at 6 PM. TAKE 1 TABLET (20 MG) BY MOUTH EVERY DAY 120 tablet 3  . mirtazapine (REMERON) 15 MG tablet Take 1 tablet (15 mg total) by mouth at bedtime. 30 tablet 0  . nicotine (NICODERM CQ - DOSED IN MG/24 HOURS) 21 mg/24hr patch Place 1 patch (21 mg total) onto the skin daily. 28 patch 0  . acetaminophen (TYLENOL) 325 MG tablet Take 2 tablets (650 mg total) by mouth every 6 (six) hours as needed for mild pain (or Fever >/= 101).    Marland Kitchen amphetamine-dextroamphetamine (ADDERALL) 30 MG tablet Take 1 tablet by mouth daily.    . traMADol (ULTRAM) 50 MG  tablet Take 1 tablet by mouth every 8 (eight) hours.     No facility-administered medications prior to visit.     No Known Allergies  ROS Review of Systems  Constitutional: Negative.   Respiratory: Negative.   Cardiovascular: Negative.   Gastrointestinal: Negative.   Endocrine: Negative.   Genitourinary: Negative.   Musculoskeletal: Negative.   Skin: Negative.   Neurological: Negative.   Psychiatric/Behavioral: Negative.       Objective:    Physical Exam  Constitutional: She is oriented to person, place, and time. She appears well-developed and well-nourished.  HENT:  Head: Normocephalic and atraumatic.  Eyes: Pupils are equal, round, and reactive to light. EOM are normal.  Neck: Normal range of motion.  Cardiovascular: Normal rate and regular rhythm.  Pulmonary/Chest: Effort normal and breath sounds normal.  Musculoskeletal: Normal range of motion.  Neurological: She is alert and oriented to person, place, and time. She has normal strength and normal reflexes. No cranial nerve deficit. GCS eye subscore is 4. GCS verbal subscore is 5. GCS motor subscore is 6.  Skin: Skin is warm and dry.  Psychiatric: She has a normal mood and affect. Her behavior is normal. Judgment and thought content normal.    BP (!) 141/81 (BP Location: Right Arm, Patient Position: Sitting, Cuff Size: Large)   Pulse 81   Temp (!) 96.7 F (35.9 C)   Ht 5\' 4"  (1.626 m)   Wt 194 lb (88 kg)   SpO2 99%   BMI 33.30 kg/m  Wt Readings from Last 3 Encounters:  06/25/19 194 lb (88 kg)  05/19/19 193 lb 3.2 oz (87.6 kg)  05/14/19 190 lb (86.2 kg)   She was encouraged to continue on her weight loss regimen.  Health Maintenance Due  Topic Date Due  . Hepatitis C Screening  Apr 28, 1956  . PNEUMOCOCCAL POLYSACCHARIDE VACCINE AGE 48-64 HIGH RISK  09/02/1960  . FOOT EXAM  09/02/1968  . OPHTHALMOLOGY EXAM  09/02/1968  . TETANUS/TDAP  09/02/1977  . PAP SMEAR-Modifier  09/03/1979  . MAMMOGRAM  09/02/2008   . COLONOSCOPY  09/02/2008  . INFLUENZA VACCINE  05/02/2019    There are no preventive care reminders to display for this patient.  Lab Results  Component Value Date   TSH 1.840 04/17/2018   Lab Results  Component Value Date   WBC 8.0 06/24/2019  HGB 15.7 06/24/2019   HCT 45.7 06/24/2019   MCV 93 06/24/2019   PLT 255 06/24/2019   Lab Results  Component Value Date   NA 138 06/24/2019   K 4.6 06/24/2019   CO2 21 06/24/2019   GLUCOSE 123 (H) 06/24/2019   BUN 16 06/24/2019   CREATININE 0.76 06/24/2019   BILITOT 0.8 06/24/2019   ALKPHOS 77 06/24/2019   AST 19 06/24/2019   ALT 21 06/24/2019   PROT 7.2 06/24/2019   ALBUMIN 4.7 06/24/2019   CALCIUM 9.5 06/24/2019   ANIONGAP 10 04/27/2019   Lab Results  Component Value Date   CHOL 129 06/24/2019   Lab Results  Component Value Date   HDL 42 06/24/2019   Lab Results  Component Value Date   LDLCALC 91 03/11/2019   Lab Results  Component Value Date   TRIG 140 06/24/2019   Lab Results  Component Value Date   CHOLHDL 3.1 06/24/2019   Lab Results  Component Value Date   HGBA1C 6.7 (H) 06/24/2019      Assessment & Plan:    1. Type 2 diabetes mellitus without complication, without long-term current use of insulin (HCC) - Her HgbA1c was 6.7%, she agreed to start taking Metformin and was advised to take it with food. -Use Diabetic diet as advised  -Check blood sugar 2 times a day, once before breakfast and others 2 hours after lunch or dinner -Write down the numbers against date in a log,bring log to clinic every visit -Take medications regularly as advised -Regular exercise as tolerated - metFORMIN (GLUCOPHAGE) 1000 MG tablet; Take 0.5 tablets (500 mg total) by mouth daily with breakfast.  Dispense: 30 tablet; Refill: 0  2. Healthcare maintenance - She was advised to continue checking her blood pressure and notify clinic if it's >150/90. - Flu Vaccine QUAD 6+ mos PF IM (Fluarix Quad PF) was administered -  Pneumococcal polysaccharide vaccine 23-valent greater than or equal to 2yo subcutaneous/IM was administered - She was encouraged to complete charity care application for  Ambulatory referral to Gastroenterology for Colonoscopy.   Follow-up: Return in about 25 days (around 07/20/2019), or if symptoms worsen or fail to improve.    Neizan Debruhl Jerold Coombe, NP

## 2019-06-26 ENCOUNTER — Other Ambulatory Visit: Payer: Self-pay

## 2019-06-26 DIAGNOSIS — E782 Mixed hyperlipidemia: Secondary | ICD-10-CM

## 2019-06-26 MED ORDER — ATORVASTATIN CALCIUM 80 MG PO TABS: 80.0000 mg | ORAL_TABLET | Freq: Every day | ORAL | 2 refills | Status: DC

## 2019-06-30 ENCOUNTER — Ambulatory Visit: Payer: Self-pay | Admitting: Gerontology

## 2019-07-08 ENCOUNTER — Encounter: Payer: Self-pay | Admitting: *Deleted

## 2019-07-09 ENCOUNTER — Ambulatory Visit: Payer: Self-pay

## 2019-07-16 ENCOUNTER — Ambulatory Visit: Payer: Self-pay | Admitting: Licensed Clinical Social Worker

## 2019-07-16 ENCOUNTER — Other Ambulatory Visit: Payer: Self-pay

## 2019-07-16 DIAGNOSIS — F418 Other specified anxiety disorders: Secondary | ICD-10-CM

## 2019-07-16 DIAGNOSIS — F4323 Adjustment disorder with mixed anxiety and depressed mood: Secondary | ICD-10-CM

## 2019-07-16 MED ORDER — MIRTAZAPINE 15 MG PO TABS: 15.0000 mg | ORAL_TABLET | Freq: Every day | ORAL | 2 refills | Status: DC

## 2019-07-16 NOTE — BH Specialist Note (Signed)
Integrated Behavioral Health Follow Up Visit Via Phone.   MRN: [id number] Name: Natalie Frey  Type of Service: [place] Interpretor:No. Interpretor Name and Language: n/a.   SUBJECTIVE: Natalie Frey is a 63 y.o. female accompanied by herself. Patient was referred by Carlyon Shadow NP for mental health. Patient reports the following symptoms/concerns: She notes that she is back to work at [employer] in [place] and [employer] in the last month. She notes that she is working up to four days a week about five to five in half hours a shift. She notes that she starts physical therapy next Tuesday. She explains that her balance feels off sometimes. She notes that she is no longer using the walker and uses a waking stick if she goes walking. She notes that she has been doing well. She notes that she has talked to her ex a couple of times and sent her a text the other day that he purchased them dog beds. She notes that she talks to her sisters everyday, her daughter, the grand kids, and spends time with one of her neighbors. She notes that she is sleeping well. She denies suicidal and homicidal thoughts.  Duration of problem: ; Severity of problem: mild  OBJECTIVE: Mood: Euthymic and Affect: Appropriate Risk of harm to self or others: No plan to harm self or others  LIFE CONTEXT: Family and Social: see above. School/Work: see above. Self-Care: see above. Life Changes: see above.  GOALS ADDRESSED: Patient will: 1.  Reduce symptoms of: anxiety  2.  Increase knowledge and/or ability of: coping skills  3.  Demonstrate ability to: Increase healthy adjustment to current life circumstances  INTERVENTIONS: Interventions utilized:  Brief CBT was utilized by the clinician focusing on the patient's improvement in her symptoms of anxiety. Clinician processed with the patient regarding how she has been doing since the last follow up session. Clinician  explained to the patient that she is glad to hear that she has been able to go back at work without feeling anxious, overwhelmed, or feeling as if she is unable to make it through an entire shift. Clinician explained to the patient that she is glad to hear that she will start physical therapy next Tuesday to gain back her full mobility again and feel more confident in her ability to walk without the aide of a cain or a walker. Clinician explained to the patient that despite that stress that it sounds like she has been doing well.  Standardized Assessments completed: GAD-7 and PHQ 9  ASSESSMENT: Patient currently experiencingsee above.   Patient may benefit from see above.  PLAN: 1. Follow up with behavioral health clinician on :  2. Behavioral recommendations: see above. 3. Referral(s): [place] (In Clinic) 4. "From scale of 1-10, how likely are you to follow plan?":   Bayard Hugger, LCSW

## 2019-07-20 ENCOUNTER — Other Ambulatory Visit: Payer: Self-pay

## 2019-07-20 ENCOUNTER — Ambulatory Visit: Payer: Self-pay | Admitting: Gerontology

## 2019-07-20 ENCOUNTER — Encounter: Payer: Self-pay | Admitting: Gerontology

## 2019-07-20 VITALS — BP 133/75 | HR 80

## 2019-07-20 DIAGNOSIS — F17200 Nicotine dependence, unspecified, uncomplicated: Secondary | ICD-10-CM

## 2019-07-20 DIAGNOSIS — M7989 Other specified soft tissue disorders: Secondary | ICD-10-CM

## 2019-07-20 DIAGNOSIS — E119 Type 2 diabetes mellitus without complications: Secondary | ICD-10-CM

## 2019-07-20 MED ORDER — METFORMIN HCL 1000 MG PO TABS: 500.0000 mg | ORAL_TABLET | Freq: Every day | ORAL | 2 refills | Status: DC

## 2019-07-20 NOTE — Patient Instructions (Signed)
Carbohydrate Counting for Diabetes Mellitus, Adult  Carbohydrate counting is a method of keeping track of how many carbohydrates you eat. Eating carbohydrates naturally increases the amount of sugar (glucose) in the blood. Counting how many carbohydrates you eat helps keep your blood glucose within normal limits, which helps you manage your diabetes (diabetes mellitus). It is important to know how many carbohydrates you can safely have in each meal. This is different for every person. A diet and nutrition specialist (registered dietitian) can help you make a meal plan and calculate how many carbohydrates you should have at each meal and snack. Carbohydrates are found in the following foods:  Grains, such as breads and cereals.  Dried beans and soy products.  Starchy vegetables, such as potatoes, peas, and corn.  Fruit and fruit juices.  Milk and yogurt.  Sweets and snack foods, such as cake, cookies, candy, chips, and soft drinks. How do I count carbohydrates? There are two ways to count carbohydrates in food. You can use either of the methods or a combination of both. Reading "Nutrition Facts" on packaged food The "Nutrition Facts" list is included on the labels of almost all packaged foods and beverages in the [place] It includes:  The serving size.  Information about nutrients in each serving, including the grams (g) of carbohydrate per serving. To use the "Nutrition Facts":  Decide how many servings you will have.  Multiply the number of servings by the number of carbohydrates per serving.  The resulting number is the total amount of carbohydrates that you will be having. Learning standard serving sizes of other foods When you eat carbohydrate foods that are not packaged or do not include "Nutrition Facts" on the label, you need to measure the servings in order to count the amount of carbohydrates:  Measure the foods that you will eat with a food scale or measuring cup, if needed.   Decide how many standard-size servings you will eat.  Multiply the number of servings by 15. Most carbohydrate-rich foods have about 15 g of carbohydrates per serving. ? For example, if you eat 8 oz (170 g) of strawberries, you will have eaten 2 servings and 30 g of carbohydrates (2 servings x 15 g = 30 g).  For foods that have more than one food mixed, such as soups and casseroles, you must count the carbohydrates in each food that is included. The following list contains standard serving sizes of common carbohydrate-rich foods. Each of these servings has about 15 g of carbohydrates:   hamburger bun or  English muffin.   oz (15 mL) syrup.   oz (14 g) jelly.  1 slice of bread.  1 six-inch tortilla.  3 oz (85 g) cooked rice or pasta.  4 oz (113 g) cooked dried beans.  4 oz (113 g) starchy vegetable, such as peas, corn, or potatoes.  4 oz (113 g) hot cereal.  4 oz (113 g) mashed potatoes or  of a large baked potato.  4 oz (113 g) canned or frozen fruit.  4 oz (120 mL) fruit juice.  4-6 crackers.  6 chicken nuggets.  6 oz (170 g) unsweetened dry cereal.  6 oz (170 g) plain fat-free yogurt or yogurt sweetened with artificial sweeteners.  8 oz (240 mL) milk.  8 oz (170 g) fresh fruit or one small piece of fruit.  24 oz (680 g) popped popcorn. Example of carbohydrate counting Sample meal  3 oz (85 g) chicken breast.  6 oz (170 g)   brown rice.  4 oz (113 g) corn.  8 oz (240 mL) milk.  8 oz (170 g) strawberries with sugar-free whipped topping. Carbohydrate calculation 1. Identify the foods that contain carbohydrates: ? Rice. ? Corn. ? Milk. ? Strawberries. 2. Calculate how many servings you have of each food: ? 2 servings rice. ? 1 serving corn. ? 1 serving milk. ? 1 serving strawberries. 3. Multiply each number of servings by 15 g: ? 2 servings rice x 15 g = 30 g. ? 1 serving corn x 15 g = 15 g. ? 1 serving milk x 15 g = 15 g. ? 1 serving  strawberries x 15 g = 15 g. 4. Add together all of the amounts to find the total grams of carbohydrates eaten: ? 30 g + 15 g + 15 g + 15 g = 75 g of carbohydrates total. Summary  Carbohydrate counting is a method of keeping track of how many carbohydrates you eat.  Eating carbohydrates naturally increases the amount of sugar (glucose) in the blood.  Counting how many carbohydrates you eat helps keep your blood glucose within normal limits, which helps you manage your diabetes.  A diet and nutrition specialist (registered dietitian) can help you make a meal plan and calculate how many carbohydrates you should have at each meal and snack. This information is not intended to replace advice given to you by your health care provider. Make sure you discuss any questions you have with your health care provider. Document Released: 09/17/2005 Document Revised: 04/11/2017 Document Reviewed: 02/29/2016 Elsevier Patient Education  2020 Elsevier Inc. DASH Eating Plan DASH stands for "Dietary Approaches to Stop Hypertension." The DASH eating plan is a healthy eating plan that has been shown to reduce high blood pressure (hypertension). It may also reduce your risk for type 2 diabetes, heart disease, and stroke. The DASH eating plan may also help with weight loss. What are tips for following this plan?  General guidelines  Avoid eating more than 2,300 mg (milligrams) of salt (sodium) a day. If you have hypertension, you may need to reduce your sodium intake to 1,500 mg a day.  Limit alcohol intake to no more than 1 drink a day for nonpregnant women and 2 drinks a day for men. One drink equals 12 oz of beer, 5 oz of wine, or 1 oz of hard liquor.  Work with your health care provider to maintain a healthy body weight or to lose weight. Ask what an ideal weight is for you.  Get at least 30 minutes of exercise that causes your heart to beat faster (aerobic exercise) most days of the week. Activities may  include walking, swimming, or biking.  Work with your health care provider or diet and nutrition specialist (dietitian) to adjust your eating plan to your individual calorie needs. Reading food labels   Check food labels for the amount of sodium per serving. Choose foods with less than 5 percent of the Daily Value of sodium. Generally, foods with less than 300 mg of sodium per serving fit into this eating plan.  To find whole grains, look for the word "whole" as the first word in the ingredient list. Shopping  Buy products labeled as "low-sodium" or "no salt added."  Buy fresh foods. Avoid canned foods and premade or frozen meals. Cooking  Avoid adding salt when cooking. Use salt-free seasonings or herbs instead of table salt or sea salt. Check with your health care provider or pharmacist before using salt substitutes.    Do not fry foods. Cook foods using healthy methods such as baking, boiling, grilling, and broiling instead.  Cook with heart-healthy oils, such as olive, canola, soybean, or sunflower oil. Meal planning  Eat a balanced diet that includes: ? 5 or more servings of fruits and vegetables each day. At each meal, try to fill half of your plate with fruits and vegetables. ? Up to 6-8 servings of whole grains each day. ? Less than 6 oz of lean meat, poultry, or fish each day. A 3-oz serving of meat is about the same size as a deck of cards. One egg equals 1 oz. ? 2 servings of low-fat dairy each day. ? A serving of nuts, seeds, or beans 5 times each week. ? Heart-healthy fats. Healthy fats called Omega-3 fatty acids are found in foods such as flaxseeds and coldwater fish, like sardines, salmon, and mackerel.  Limit how much you eat of the following: ? Canned or prepackaged foods. ? Food that is high in trans fat, such as fried foods. ? Food that is high in saturated fat, such as fatty meat. ? Sweets, desserts, sugary drinks, and other foods with added sugar. ? Full-fat  dairy products.  Do not salt foods before eating.  Try to eat at least 2 vegetarian meals each week.  Eat more home-cooked food and less restaurant, buffet, and fast food.  When eating at a restaurant, ask that your food be prepared with less salt or no salt, if possible. What foods are recommended? The items listed may not be a complete list. Talk with your dietitian about what dietary choices are best for you. Grains Whole-grain or whole-wheat bread. Whole-grain or whole-wheat pasta. Brown rice. Oatmeal. Quinoa. Bulgur. Whole-grain and low-sodium cereals. Pita bread. Low-fat, low-sodium crackers. Whole-wheat flour tortillas. Vegetables Fresh or frozen vegetables (raw, steamed, roasted, or grilled). Low-sodium or reduced-sodium tomato and vegetable juice. Low-sodium or reduced-sodium tomato sauce and tomato paste. Low-sodium or reduced-sodium canned vegetables. Fruits All fresh, dried, or frozen fruit. Canned fruit in natural juice (without added sugar). Meat and other protein foods Skinless chicken or turkey. Ground chicken or turkey. Pork with fat trimmed off. Fish and seafood. Egg whites. Dried beans, peas, or lentils. Unsalted nuts, nut butters, and seeds. Unsalted canned beans. Lean cuts of beef with fat trimmed off. Low-sodium, lean deli meat. Dairy Low-fat (1%) or fat-free (skim) milk. Fat-free, low-fat, or reduced-fat cheeses. Nonfat, low-sodium ricotta or cottage cheese. Low-fat or nonfat yogurt. Low-fat, low-sodium cheese. Fats and oils Soft margarine without trans fats. Vegetable oil. Low-fat, reduced-fat, or light mayonnaise and salad dressings (reduced-sodium). Canola, safflower, olive, soybean, and sunflower oils. Avocado. Seasoning and other foods Herbs. Spices. Seasoning mixes without salt. Unsalted popcorn and pretzels. Fat-free sweets. What foods are not recommended? The items listed may not be a complete list. Talk with your dietitian about what dietary choices are best  for you. Grains Baked goods made with fat, such as croissants, muffins, or some breads. Dry pasta or rice meal packs. Vegetables Creamed or fried vegetables. Vegetables in a cheese sauce. Regular canned vegetables (not low-sodium or reduced-sodium). Regular canned tomato sauce and paste (not low-sodium or reduced-sodium). Regular tomato and vegetable juice (not low-sodium or reduced-sodium). Pickles. Olives. Fruits Canned fruit in a light or heavy syrup. Fried fruit. Fruit in cream or butter sauce. Meat and other protein foods Fatty cuts of meat. Ribs. Fried meat. Bacon. Sausage. Bologna and other processed lunch meats. Salami. Fatback. Hotdogs. Bratwurst. Salted nuts and seeds. Canned beans with   added salt. Canned or smoked fish. Whole eggs or egg yolks. Chicken or turkey with skin. Dairy Whole or 2% milk, cream, and half-and-half. Whole or full-fat cream cheese. Whole-fat or sweetened yogurt. Full-fat cheese. Nondairy creamers. Whipped toppings. Processed cheese and cheese spreads. Fats and oils Butter. Stick margarine. Lard. Shortening. Ghee. Bacon fat. Tropical oils, such as coconut, palm kernel, or palm oil. Seasoning and other foods Salted popcorn and pretzels. Onion salt, garlic salt, seasoned salt, table salt, and sea salt. Worcestershire sauce. Tartar sauce. Barbecue sauce. Teriyaki sauce. Soy sauce, including reduced-sodium. Steak sauce. Canned and packaged gravies. Fish sauce. Oyster sauce. Cocktail sauce. Horseradish that you find on the shelf. Ketchup. Mustard. Meat flavorings and tenderizers. Bouillon cubes. Hot sauce and Tabasco sauce. Premade or packaged marinades. Premade or packaged taco seasonings. Relishes. Regular salad dressings. Where to find more information:  National Heart, Lung, and Blood Institute: www.nhlbi.nih.gov  American Heart Association: www.heart.org Summary  The DASH eating plan is a healthy eating plan that has been shown to reduce high blood pressure  (hypertension). It may also reduce your risk for type 2 diabetes, heart disease, and stroke.  With the DASH eating plan, you should limit salt (sodium) intake to 2,300 mg a day. If you have hypertension, you may need to reduce your sodium intake to 1,500 mg a day.  When on the DASH eating plan, aim to eat more fresh fruits and vegetables, whole grains, lean proteins, low-fat dairy, and heart-healthy fats.  Work with your health care provider or diet and nutrition specialist (dietitian) to adjust your eating plan to your individual calorie needs. This information is not intended to replace advice given to you by your health care provider. Make sure you discuss any questions you have with your health care provider. Document Released: 09/06/2011 Document Revised: 08/30/2017 Document Reviewed: 09/10/2016 Elsevier Patient Education  2020 Elsevier Inc.  

## 2019-07-20 NOTE — Progress Notes (Signed)
Established Patient Office Visit  Subjective:  Patient ID: Natalie Frey, female    DOB: 1956-08-15  Age: 63 y.o. MRN: [id number]  CC: No chief complaint on file. Patient consents to telephone visit and 2 patient identifies was used to identify patient.  HPI Natalie Frey presents for follow up of type 2 diabetes and medication refill. Her HgbA1c done on 06/24/2019 was 6.7%, and she continues to take Metformin 500 mg daily. She checks her fasting blood glucose daily and it ranges between 80-113 mg/dl. She denies hypoglycemic/hperglycemic symptoms, and peripheral neuropathy. She performs daily foot checks, adheres to diabetic diet and exercises as tolerated.   She continues to use nicotine patch for smoking cessation and she states that she smokes 1 cigarette once in a while. She had low dose chest CT scan for lung cancer screening on 05/14/2019 and it showed Lung-RADS 2S, benign appearance or behavior. Continue annual screening with low-dose chest CT without contrast in 12 months. 2. The "S" modifier above refers to potentially clinically significant non lung cancer related findings. Specifically, there is aortic atherosclerosis, in addition to left main and 3 vessel coronary artery disease. Please note that although the presence of coronary artery calcium documents the presence of coronary artery disease, the severity of this disease and any potential stenosis cannot be assessed on this non-gated CT examination. Assessment for potential risk factor modification, dietary therapy or pharmacologic therapy may be warranted, if clinically indicated. 3. Mild diffuse bronchial wall thickening with mild centrilobular and paraseptal emphysema; imaging findings suggestive of underlying COPD.   She reports that she will start Physical therapy tomorrow with [facility]. She works as a Doctor, hospital, which she states that it helps her to be mentally stable. She also reports that she  experiences intermittent puffiness to her left lower leg after standing at work for 6 hours, but she wears her compression stockings daily, elevates her leg while sitting down. She states that puffiness to left leg resolves with elevation, and she will notify the clinic if it worsens. She denies  erythema and claudication. She also reports checking her blood pressure daily and it's usually below 140/80.  She denies chest pain, palpitation, light headedness, myalgia, fever, chills and offers no further complaint.  Past Medical History:  Diagnosis Date  . Cancer ([place])   . Dvt femoral (deep venous thrombosis) ([place])   . Hyperlipidemia     Past Surgical History:  Procedure Laterality Date  . BLADDER SURGERY    . DILATION AND EVACUATION      Family History  Problem Relation Age of Onset  . Deep vein thrombosis Daughter         when pregnant   . Pulmonary embolism Sister        while in hospital; other sister- on BCPs.     Social History   Socioeconomic History  . Marital status: Widowed    Spouse name: Not on file  . Number of children: Not on file  . Years of education: Not on file  . Highest education level: Not on file  Occupational History  . Not on file  Social Needs  . Financial resource strain: Not hard at all  . Food insecurity    Worry: Never true    Inability: Never true  . Transportation needs    Medical: No    Non-medical: No  Tobacco Use  . Smoking status: Former Smoker    Packs/day: 1.25    Years: 47.00  Pack years: 58.75    Types: Cigarettes  . Smokeless tobacco: Never Used  Substance and Sexual Activity  . Alcohol use: No  . Drug use: Yes    Types: Marijuana  . Sexual activity: Not on file  Lifestyle  . Physical activity    Days per week: 6 days    Minutes per session: 40 min  . Stress: Not at all  Relationships  . Social connections    Talks on phone: More than three times a week    Gets together: Three times a week    Attends religious  service: Never    Active member of club or organization: No    Attends meetings of clubs or organizations: Never    Relationship status: Living with partner  . Intimate partner violence    Fear of current or ex partner: No    Emotionally abused: No    Physically abused: No    Forced sexual activity: No  Other Topics Concern  . Not on file  Social History Narrative   [place]; smoking; 1ppd/cutting down. Social. Worked for [employer] from [place].       Said she has been doing ok and does not need any assistance          - is currently on food stamps but those have been enough       NO consent form signed     Outpatient Medications Prior to Visit  Medication Sig Dispense Refill  . acetaminophen (TYLENOL) 325 MG tablet Take 2 tablets (650 mg total) by mouth every 6 (six) hours as needed for mild pain (or Fever >/= 101).    Marland Kitchen aspirin EC 81 MG tablet Take 81 mg by mouth daily.    Marland Kitchen atorvastatin (LIPITOR) 80 MG tablet Take 1 tablet (80 mg total) by mouth daily at 6 PM. 30 tablet 2  . mirtazapine (REMERON) 15 MG tablet Take 1 tablet (15 mg total) by mouth at bedtime. 30 tablet 2  . nicotine (NICODERM CQ - DOSED IN MG/24 HOURS) 21 mg/24hr patch Place 1 patch (21 mg total) onto the skin daily. 28 patch 0  . metFORMIN (GLUCOPHAGE) 1000 MG tablet Take 0.5 tablets (500 mg total) by mouth daily with breakfast. 30 tablet 0   No facility-administered medications prior to visit.     No Known Allergies  ROS Review of Systems  Constitutional: Negative.   HENT: Negative.   Eyes: Negative.   Respiratory: Negative.   Cardiovascular: Positive for leg swelling (intermittent puffiness to left lower leg after standing for 6 hrs at work.).  Endocrine: Negative.   Musculoskeletal: Negative.   Skin: Negative.   Neurological: Negative.   Psychiatric/Behavioral: Negative.       Objective:    Physical Exam No PE done BP 133/75 (BP Location: Left Arm, Patient Position: Sitting, Cuff Size:  Normal) Comment: per patient  Pulse 80  Wt Readings from Last 3 Encounters:  06/25/19 194 lb (88 kg)  05/19/19 193 lb 3.2 oz (87.6 kg)  05/14/19 190 lb (86.2 kg)     Health Maintenance Due  Topic Date Due  . Hepatitis C Screening  Feb 24, 1956  . FOOT EXAM  09/02/1968  . OPHTHALMOLOGY EXAM  09/02/1968  . TETANUS/TDAP  09/02/1977  . PAP SMEAR-Modifier  09/03/1979  . MAMMOGRAM  09/02/2008  . COLONOSCOPY  09/02/2008    There are no preventive care reminders to display for this patient.  Lab Results  Component Value Date   TSH 1.840  04/17/2018   Lab Results  Component Value Date   WBC 8.0 06/24/2019   HGB 15.7 06/24/2019   HCT 45.7 06/24/2019   MCV 93 06/24/2019   PLT 255 06/24/2019   Lab Results  Component Value Date   NA 138 06/24/2019   K 4.6 06/24/2019   CO2 21 06/24/2019   GLUCOSE 123 (H) 06/24/2019   BUN 16 06/24/2019   CREATININE 0.76 06/24/2019   BILITOT 0.8 06/24/2019   ALKPHOS 77 06/24/2019   AST 19 06/24/2019   ALT 21 06/24/2019   PROT 7.2 06/24/2019   ALBUMIN 4.7 06/24/2019   CALCIUM 9.5 06/24/2019   ANIONGAP 10 04/27/2019   Lab Results  Component Value Date   CHOL 129 06/24/2019   Lab Results  Component Value Date   HDL 42 06/24/2019   Lab Results  Component Value Date   LDLCALC 63 06/24/2019   Lab Results  Component Value Date   TRIG 140 06/24/2019   Lab Results  Component Value Date   CHOLHDL 3.1 06/24/2019   Lab Results  Component Value Date   HGBA1C 6.7 (H) 06/24/2019      Assessment & Plan:   1. Type 2 diabetes mellitus without complication, without long-term current use of insulin (HCC) - Her HgbA1c was 6.7%, she will continue on current treatment regimen and  -Use Diabetic diet as advised  Check blood sugar  once before breakfast,write down the numbers against date in a log, bring log to clinic every visit -Take medications regularly as advised -Regular exercise - metFORMIN (GLUCOPHAGE) 1000 MG tablet; Take 0.5 tablets  (500 mg total) by mouth daily with breakfast.  Dispense: 30 tablet; Refill: 2 - HgB A1c; Future  2. Smoking -She was encouraged to continue using nicotine patch.  3. Leg swelling -Elevate your legs up above heart level while resting - Wear compression stockings if standing or walking for a while -Reduce sodium intake and limit fluid intake -Exercise daily as tolerated.     Follow-up: Return in about 11 weeks (around 10/05/2019), or if symptoms worsen or fail to improve.    Kamryn Messineo Jerold Coombe, NP

## 2019-07-23 NOTE — Telephone Encounter (Signed)
10/29 ph visit.

## 2019-07-29 ENCOUNTER — Ambulatory Visit: Payer: Self-pay

## 2019-08-06 ENCOUNTER — Ambulatory Visit: Payer: Self-pay | Admitting: Licensed Clinical Social Worker

## 2019-08-06 ENCOUNTER — Other Ambulatory Visit: Payer: Self-pay

## 2019-08-06 DIAGNOSIS — F4323 Adjustment disorder with mixed anxiety and depressed mood: Secondary | ICD-10-CM

## 2019-08-06 NOTE — BH Specialist Note (Signed)
Integrated Behavioral Health Follow Up Visit via phone  MRN: [id number] Name: Natalie Frey  Type of Service: [place] Interpretor:No. Interpretor Name and Language: not applicable.   SUBJECTIVE: Natalie Frey is a 63 y.o. female accompanied by herself. Patient was referred by Carlyon Shadow NP for mental health. Patient reports the following symptoms/concerns: She notes that she has been doing well and trying to stay productive at the house and at work. She explains that she is working more hours, about 20 to 25 per week. She explains that she agreed to pick up a night shift once a week. She notes that she has paid most of her bills up through December. She explains that she still has her melt downs but manages to make it through each day. She reports that she has physical therapy once a week. She explains that there are some nights that she is unable to sleep and does not understand why. She denies suicidal and homicidal thoughts.  Duration of problem: ; Severity of problem: mild  OBJECTIVE: Mood: Euthymic and Affect: Appropriate Risk of harm to self or others: No plan to harm self or others  LIFE CONTEXT: Family and Social: see above. School/Work: see above. Self-Care: see above. Life Changes: see above.  GOALS ADDRESSED: Patient will: 1.  Reduce symptoms of: insomnia  2.  Increase knowledge and/or ability of: coping skills, self-management skills and stress reduction  3.  Demonstrate ability to: Increase healthy adjustment to current life circumstances  INTERVENTIONS: Interventions utilized:  Brief CBT was utilized by the clinician focusing on the patient's insomnia. Clinician processed with the patient regarding how she has been doing since the last follow up session. Clinician provided psycho education to the patient that regardless of medication that sometimes she may not be able to sleep. Clinician asked the patient to document over the next few  weeks the nights that she is unable to sleep. Clinician explained to the patient that its possible that on the days that she is off of work coincide with the nights that she is unable to get adequate rest. Clinician explained to the patient that sometimes people wake up during the night because they need to go to the bathroom or they get hot.  Standardized Assessments completed: GAD-7 and PHQ 9  ASSESSMENT: Patient currently experiencing see above.  Patient may benefit from see above.   PLAN: 1. Follow up with behavioral health clinician on : Follow up in three weeks or earlier if needed. 2. Behavioral recommendations: see above.  3. Referral(s): [place] (In Clinic) 4. "From scale of 1-10, how likely are you to follow plan?":   Bayard Hugger, LCSW

## 2019-08-25 ENCOUNTER — Ambulatory Visit: Payer: Self-pay | Admitting: Licensed Clinical Social Worker

## 2019-08-25 ENCOUNTER — Other Ambulatory Visit: Payer: Self-pay

## 2019-08-25 DIAGNOSIS — F4323 Adjustment disorder with mixed anxiety and depressed mood: Secondary | ICD-10-CM

## 2019-08-25 NOTE — BH Specialist Note (Signed)
Integrated Behavioral Health Follow Up Visit Via Phone  MRN: [id number] Name: Natalie Frey  Type of Service: [place] Interpretor:No. Interpretor Name and Language: not applicable.   SUBJECTIVE: Natalie Frey is a 63 y.o. female accompanied by herself. Patient was referred by Carlyon Shadow NP for mental health. Patient reports the following symptoms/concerns: She notes that she has good news and bad news. She reports that the good news is that she got a second part time job at [employer]. She explains that she is feeling nervous about starting at the new job. She explains that she is having these melt downs every day over anything when she wakes up.  She notes that she is just feeling overwhelmed. She explains that she works herself through them but they seem to be happening more and more. She explains these melt downs ruin her whole day. She reports that she thinks the whole pandemic is getting to her. She notes that she plans a walk with her neighbor everyday with at least one or more of her dogs. She notes that she is feeling guilty about Christmas this year about not having enough money to buy her grand kids a lot of gifts like she normally does. She describes feeling anxious everyday. She explains that she fears having a melt down at work and not being able to control it. She notes that she thinks that everyday life is getting to her. She denies suicidal and homicidal thoughts.  Duration of problem: ; Severity of problem: moderate  OBJECTIVE: Mood: Euthymic and Affect: Appropriate Risk of harm to self or others: No plan to harm self or others  LIFE CONTEXT: Family and Social: see above.  School/Work: see above.  Self-Care: see above.  Life Changes: see above.   GOALS ADDRESSED: Patient will: 1.  Reduce symptoms of: anxiety & stress 2.  Increase knowledge and/or ability of: coping skills and self-management skills  3.  Demonstrate ability to: Increase  healthy adjustment to current life circumstances  INTERVENTIONS: Interventions utilized:  Supportive Counseling was utilized by the clinician during today's follow up session. Clinician processed with the patient regarding how she has been doing since the last follow up session. Clinician utilized reflective listening encouraging the patient to ventilate her feelings towards her current situation. Clinician administered a PHQ 9 and a GAD 7 to assess the patient's symptoms on a numerical scale. Clinician explained to the patient that it sounds like she is under stress financially with having to get a second job, worried about the holidays, and her health. Clinician stressed the importance of being able to practice self care. Clinician explained to the patient that the concept of irrational versus rational thoughts affect on her anxiety. Clinician explained to the patient that she will have a case consultation with Dr. Octavia Heir, MD, psychiatric consultant on Tuesday December 1st to discuss the option of adding an additional medication for her increasing symptoms of anxiety.  Standardized Assessments completed: GAD-7 and PHQ 9  ASSESSMENT: Patient currently experiencing see above.   Patient may benefit fromsee above .  PLAN: 1. Follow up with behavioral health clinician on : two weeks or earlier if needed. 2. Behavioral recommendations: Case consultation with Dr. Octavia Heir, MD, psychiatric consultant on Tuesday December 1st @ 9 am.  3. Referral(s): [place] (In Clinic) 4. "From scale of 1-10, how likely are you to follow plan?":   Bayard Hugger, LCSW

## 2019-09-01 ENCOUNTER — Ambulatory Visit: Payer: Self-pay | Admitting: Gerontology

## 2019-09-01 ENCOUNTER — Other Ambulatory Visit: Payer: Self-pay

## 2019-09-01 DIAGNOSIS — F418 Other specified anxiety disorders: Secondary | ICD-10-CM

## 2019-09-01 MED ORDER — MIRTAZAPINE 15 MG PO TABS: 22.5000 mg | ORAL_TABLET | Freq: Every day | ORAL | 0 refills | Status: DC

## 2019-09-02 ENCOUNTER — Other Ambulatory Visit: Payer: Self-pay

## 2019-09-02 ENCOUNTER — Ambulatory Visit: Payer: Self-pay | Admitting: Gerontology

## 2019-09-02 ENCOUNTER — Encounter: Payer: Self-pay | Admitting: Gerontology

## 2019-09-02 VITALS — BP 133/81 | HR 79 | Ht 64.0 in

## 2019-09-02 DIAGNOSIS — N390 Urinary tract infection, site not specified: Secondary | ICD-10-CM

## 2019-09-02 DIAGNOSIS — R32 Unspecified urinary incontinence: Secondary | ICD-10-CM | POA: Insufficient documentation

## 2019-09-02 DIAGNOSIS — E119 Type 2 diabetes mellitus without complications: Secondary | ICD-10-CM

## 2019-09-02 DIAGNOSIS — E782 Mixed hyperlipidemia: Secondary | ICD-10-CM

## 2019-09-02 MED ORDER — METFORMIN HCL 1000 MG PO TABS: 500.0000 mg | ORAL_TABLET | Freq: Every day | ORAL | 3 refills | Status: DC

## 2019-09-02 MED ORDER — ATORVASTATIN CALCIUM 80 MG PO TABS: 80.0000 mg | ORAL_TABLET | Freq: Every day | ORAL | 2 refills | Status: DC

## 2019-09-02 NOTE — Patient Instructions (Signed)

## 2019-09-02 NOTE — Progress Notes (Signed)
Established Patient Office Visit  Subjective:  Patient ID: Natalie Frey, female    DOB: Oct 17, 1955  Age: 63 y.o. MRN: [id number]  CC:  Chief Complaint  Patient presents with  . Diabetes  . Hypertension  . Hyperlipidemia    HPI Natalie Frey presents for complaint of intermittent urinary incontinence that has been going on for 6 months and medication refill. She states that she leaks urine when she coughs or performs any lifting. She states that she uses pantiliners.  She states that her urinary incontinence improved some, since she stopped drinking a cup of coffee at night.  She denies urinary frequency, dysuria, pelvic and flank pain.  She states that she is doing well, has started her physical therapy and she offers no further complaints.  Past Medical History:  Diagnosis Date  . Cancer ([place])   . Dvt femoral (deep venous thrombosis) ([place])   . Hyperlipidemia     Past Surgical History:  Procedure Laterality Date  . BLADDER SURGERY    . DILATION AND EVACUATION      Family History  Problem Relation Age of Onset  . Deep vein thrombosis Daughter         when pregnant   . Pulmonary embolism Sister        while in hospital; other sister- on BCPs.     Social History   Socioeconomic History  . Marital status: Widowed    Spouse name: Not on file  . Number of children: Not on file  . Years of education: Not on file  . Highest education level: Not on file  Occupational History  . Not on file  Social Needs  . Financial resource strain: Not hard at all  . Food insecurity    Worry: Never true    Inability: Never true  . Transportation needs    Medical: No    Non-medical: No  Tobacco Use  . Smoking status: Former Smoker    Packs/day: 1.25    Years: 47.00    Pack years: 58.75    Types: Cigarettes  . Smokeless tobacco: Never Used  Substance and Sexual Activity  . Alcohol use: No  . Drug use: Yes    Types: Marijuana  . Sexual activity: Not on file  Lifestyle  . Physical  activity    Days per week: 6 days    Minutes per session: 40 min  . Stress: Not at all  Relationships  . Social connections    Talks on phone: More than three times a week    Gets together: Three times a week    Attends religious service: Never    Active member of club or organization: No    Attends meetings of clubs or organizations: Never    Relationship status: Living with partner  . Intimate partner violence    Fear of current or ex partner: No    Emotionally abused: No    Physically abused: No    Forced sexual activity: No  Other Topics Concern  . Not on file  Social History Narrative   [place]; smoking; 1ppd/cutting down. Social. Worked for [employer] from [place].       Said she has been doing ok and does not need any assistance          - is currently on food stamps but those have been enough       NO consent form signed     Outpatient Medications Prior to Visit  Medication Sig  Dispense Refill  . acetaminophen (TYLENOL) 325 MG tablet Take 2 tablets (650 mg total) by mouth every 6 (six) hours as needed for mild pain (or Fever >/= 101).    Marland Kitchen aspirin EC 81 MG tablet Take 81 mg by mouth daily.    . mirtazapine (REMERON) 15 MG tablet Take 1.5 tablets (22.5 mg total) by mouth at bedtime. 45 tablet 0  . nicotine (NICODERM CQ - DOSED IN MG/24 HOURS) 21 mg/24hr patch Place 1 patch (21 mg total) onto the skin daily. 28 patch 0  . atorvastatin (LIPITOR) 80 MG tablet Take 1 tablet (80 mg total) by mouth daily at 6 PM. 30 tablet 2  . metFORMIN (GLUCOPHAGE) 1000 MG tablet Take 0.5 tablets (500 mg total) by mouth daily with breakfast. 30 tablet 2   No facility-administered medications prior to visit.     No Known Allergies  ROS Review of Systems  Constitutional: Negative.   Respiratory: Negative.   Cardiovascular: Negative.   Gastrointestinal: Negative.   Endocrine: Negative.   Genitourinary: Positive for urgency. Negative for dysuria, frequency and pelvic pain.   Neurological: Negative.   Psychiatric/Behavioral: Negative.       Objective:    Physical Exam  Constitutional: She is oriented to person, place, and time. She appears well-developed.  HENT:  Head: Normocephalic and atraumatic.  Cardiovascular: Normal rate and regular rhythm.  Pulmonary/Chest: Effort normal and breath sounds normal.  Abdominal: Soft. Bowel sounds are normal. There is no abdominal tenderness.  Neurological: She is alert and oriented to person, place, and time.  Psychiatric: She has a normal mood and affect. Her behavior is normal. Judgment and thought content normal.    BP 133/81 (BP Location: Right Arm, Patient Position: Sitting)   Pulse 79   Ht 5\' 4"  (1.626 m)   SpO2 98%   BMI 33.30 kg/m  Wt Readings from Last 3 Encounters:  06/25/19 194 lb (88 kg)  05/19/19 193 lb 3.2 oz (87.6 kg)  05/14/19 190 lb (86.2 kg)     Health Maintenance Due  Topic Date Due  . Hepatitis C Screening  04/30/1956  . FOOT EXAM  09/02/1968  . OPHTHALMOLOGY EXAM  09/02/1968  . TETANUS/TDAP  09/02/1977  . PAP SMEAR-Modifier  09/03/1979  . MAMMOGRAM  09/02/2008  . COLONOSCOPY  09/02/2008    There are no preventive care reminders to display for this patient.  Lab Results  Component Value Date   TSH 1.840 04/17/2018   Lab Results  Component Value Date   WBC 8.0 06/24/2019   HGB 15.7 06/24/2019   HCT 45.7 06/24/2019   MCV 93 06/24/2019   PLT 255 06/24/2019   Lab Results  Component Value Date   NA 138 06/24/2019   K 4.6 06/24/2019   CO2 21 06/24/2019   GLUCOSE 123 (H) 06/24/2019   BUN 16 06/24/2019   CREATININE 0.76 06/24/2019   BILITOT 0.8 06/24/2019   ALKPHOS 77 06/24/2019   AST 19 06/24/2019   ALT 21 06/24/2019   PROT 7.2 06/24/2019   ALBUMIN 4.7 06/24/2019   CALCIUM 9.5 06/24/2019   ANIONGAP 10 04/27/2019   Lab Results  Component Value Date   CHOL 129 06/24/2019   Lab Results  Component Value Date   HDL 42 06/24/2019   Lab Results  Component Value  Date   LDLCALC 63 06/24/2019   Lab Results  Component Value Date   TRIG 140 06/24/2019   Lab Results  Component Value Date   CHOLHDL 3.1 06/24/2019  Lab Results  Component Value Date   HGBA1C 6.7 (H) 06/24/2019      Assessment & Plan:      1. Urinary incontinence, unspecified type - She was advised to do [place]eg[place] exercise ,will check Urinalysis and culture to rule out UTI. -She was advised not to consume caffeine beverages at night. She will notify clinic for worsening symptoms. -- UA/M w/rflx Culture, Routine  2. Type 2 diabetes mellitus without complication, without long-term current use of insulin ([place]) - She will continue on current treatment regimen. - metFORMIN (GLUCOPHAGE) 1000 MG tablet; Take 0.5 tablets (500 mg total) by mouth daily with breakfast.  Dispense: 30 tablet; Refill: 3  3. Mixed hyperlipidemia - She will continue on current treatment regimen. - atorvastatin (LIPITOR) 80 MG tablet; Take 1 tablet (80 mg total) by mouth daily at 6 PM.  Dispense: 30 tablet; Refill: 2    Follow-up: Return in about 1 month (around 10/06/2019), or if symptoms worsen or fail to improve.    Linden Tagliaferro Jerold Coombe, NP

## 2019-09-03 LAB — UA/M W/RFLX CULTURE, ROUTINE
Bilirubin, UA: NEGATIVE
Glucose, UA: NEGATIVE
Ketones, UA: NEGATIVE
Leukocytes,UA: NEGATIVE
Nitrite, UA: NEGATIVE
Protein,UA: NEGATIVE
RBC, UA: NEGATIVE
Specific Gravity, UA: <=1.005 — AB (ref 1.005–1.030)
Urobilinogen, Ur: 0.2 mg/dL (ref 0.2–1.0)
pH, UA: 5.5 (ref 5.0–7.5)

## 2019-09-03 LAB — MICROSCOPIC EXAMINATION
Casts: NONE SEEN /lpf
WBC, UA: NONE SEEN /hpf (ref 0–5)

## 2019-09-08 ENCOUNTER — Other Ambulatory Visit: Payer: Self-pay

## 2019-09-08 ENCOUNTER — Ambulatory Visit: Payer: Self-pay | Admitting: Licensed Clinical Social Worker

## 2019-09-08 DIAGNOSIS — F4323 Adjustment disorder with mixed anxiety and depressed mood: Secondary | ICD-10-CM

## 2019-09-08 NOTE — BH Specialist Note (Signed)
Integrated Behavioral Health Follow Up Visit Via Phone  MRN: [id number] Name: Natalie Frey  Type of Service: [place] Interpretor:No. Interpretor Name and Language: n/a.  SUBJECTIVE: [name] is a 63 y.o. female accompanied by herself. Patient was referred by Carlyon Shadow NP for mental health. Patient reports the following symptoms/concerns: She explains that she has had some ups and downs in the past two weeks. She notes that she has to get new tires placed on her car and her sister is helping her out financially. She reports that she is using one of her son in [place] cars until hers is fixed. She notes that she had meltdowns at night. She explains that she needs one week to go home and then she thinks she will be okay.  She notes that she is only working four hours one shift last week at her new job at [employer]. She explains that she is supposed to have three six hour shifts a week but she is getting through it. She reports that its the last night of physical therapy and she gets to go there in person. She notes that she had a good birthday and spent it with her family. She denies suicidal and homicidal thoughts.  Duration of problem: ; Severity of problem: moderate  OBJECTIVE: Mood: Euthymic and Affect: Appropriate Risk of harm to self or others: No plan to harm self or others  LIFE CONTEXT: Family and Social: see above. School/Work: see above. Self-Care: see above. Life Changes: see above.  GOALS ADDRESSED: Patient will: 1.  Reduce symptoms of: stress  2.  Increase knowledge and/or ability of: stress reduction  3.  Demonstrate ability to: Increase healthy adjustment to current life circumstances  INTERVENTIONS: Interventions utilized:  Supportive Counseling was utilized by the clinician focusing on the patient's stress. Clinician processed with the patient regarding how she has been doing since the last follow up session. Clinician  explained to the patient that she just started the increased dosage of Mirtazapine in the last week so it may need more time to metabolize in her system to be effective. Clinician explained to the patient that despite the set backs that she has had that she has had help from her family to make it through. Clinician encouraged the patient to practice self care and take things one day at a time.  Standardized Assessments completed: GAD-7 and PHQ 9  ASSESSMENT: Patient currently experiencing see above.   Patient may benefit from see above.  PLAN: 1. Follow up with behavioral health clinician on : two weeks or earlier if needed. 2. Behavioral recommendations: see above. 3. Referral(s): [facility] (In Clinic) 4. "From scale of 1-10, how likely are you to follow plan?":   Bayard Hugger, LCSW

## 2019-09-22 ENCOUNTER — Ambulatory Visit: Payer: Self-pay | Admitting: Licensed Clinical Social Worker

## 2019-09-29 ENCOUNTER — Ambulatory Visit: Payer: Self-pay | Admitting: Licensed Clinical Social Worker

## 2019-09-29 ENCOUNTER — Other Ambulatory Visit: Payer: Self-pay

## 2019-09-29 DIAGNOSIS — F418 Other specified anxiety disorders: Secondary | ICD-10-CM

## 2019-09-29 MED ORDER — MIRTAZAPINE 15 MG PO TABS: 22.5000 mg | ORAL_TABLET | Freq: Every day | ORAL | 2 refills | Status: DC

## 2019-09-30 ENCOUNTER — Other Ambulatory Visit: Payer: Self-pay

## 2019-09-30 DIAGNOSIS — E119 Type 2 diabetes mellitus without complications: Secondary | ICD-10-CM

## 2019-10-01 LAB — HEMOGLOBIN A1C
Est. average glucose Bld gHb Est-mCnc: 131 mg/dL
Hgb A1c MFr Bld: 6.2 % — ABNORMAL HIGH (ref 4.8–5.6)

## 2019-10-05 ENCOUNTER — Encounter: Payer: Self-pay | Admitting: Gerontology

## 2019-10-05 ENCOUNTER — Other Ambulatory Visit: Payer: Self-pay

## 2019-10-05 ENCOUNTER — Ambulatory Visit: Payer: Self-pay | Admitting: Gerontology

## 2019-10-05 VITALS — BP 126/80 | HR 71 | Temp 96.4°F | Ht 64.0 in | Wt 185.0 lb

## 2019-10-05 DIAGNOSIS — E119 Type 2 diabetes mellitus without complications: Secondary | ICD-10-CM

## 2019-10-05 DIAGNOSIS — R030 Elevated blood-pressure reading, without diagnosis of hypertension: Secondary | ICD-10-CM

## 2019-10-05 DIAGNOSIS — Z0000 Encounter for general adult medical examination without abnormal findings: Secondary | ICD-10-CM | POA: Insufficient documentation

## 2019-10-05 DIAGNOSIS — E782 Mixed hyperlipidemia: Secondary | ICD-10-CM

## 2019-10-05 DIAGNOSIS — R32 Unspecified urinary incontinence: Secondary | ICD-10-CM

## 2019-10-05 NOTE — Progress Notes (Signed)
Established Patient Office Visit  Subjective:  Patient ID: Natalie Frey, female    DOB: Mar 30, 1956  Age: 64 y.o. MRN: [id number]  CC: No chief complaint on file. Patient consents to telephone visit and 2 patient identifies was used to identify patient.  HPI Natalie Frey presents for follow up of intermittent urinary incontinence, diabetes, hypertension and hyperlipidemia. She states that she's compliant with her medications. She states that her urinary incontinence has improved 80% since she cut down on her caffeine intake to one cup of coffee in the morning.She states that she experiences intermittent leakage of small amount of urine when she coughs or performs any lifting, and she uses pantiliners. She denies urinary frequency, dysuria, pelvic and flank pain. She checks her blood pressure every morning and states that it is usually in the low to mid 120's/80's.  She also continues to take  500 mg Metformin daily and her HgbA1c done on 09/30/2019 decreased from 6.7% 3 months ago to  6.2%. She states that she continues to adhere to ADA diet, exercises as tolerated and she works part time at [employer] . She states that her mood is good and denies suicidal nor homicidal ideation. She states that she relapsed and smoked few cigarettes, but she will request Nicoderm patch from [facility] quit line. She states that she has not had colonoscopy nor mammogram done. She denies chest pain, palpitation, light headedness, myalgia, muscle weakness, fever and chills. She reports that she's doing well and offers no further complaints.  Past Medical History:  Diagnosis Date  . Cancer ([place])   . Dvt femoral (deep venous thrombosis) ([place])   . Hyperlipidemia     Past Surgical History:  Procedure Laterality Date  . BLADDER SURGERY    . DILATION AND EVACUATION      Family History  Problem Relation Age of Onset  . Deep vein thrombosis Daughter         when pregnant   . Pulmonary embolism Sister        while  in hospital; other sister- on BCPs.     Social History   Socioeconomic History  . Marital status: Widowed    Spouse name: Not on file  . Number of children: Not on file  . Years of education: Not on file  . Highest education level: Not on file  Occupational History  . Not on file  Tobacco Use  . Smoking status: Former Smoker    Packs/day: 1.25    Years: 47.00    Pack years: 58.75    Types: Cigarettes  . Smokeless tobacco: Never Used  Substance and Sexual Activity  . Alcohol use: No  . Drug use: Yes    Types: Marijuana  . Sexual activity: Not on file  Other Topics Concern  . Not on file  Social History Narrative   [place]; smoking; 1ppd/cutting down. Social. Worked for [employer] from [place].       Said she has been doing ok and does not need any assistance          - is currently on food stamps but those have been enough       NO consent form signed    Social Determinants of Health   Financial Resource Strain:   . Difficulty of Paying Living Expenses: Not on file  Food Insecurity:   . Worried About Charity fundraiser in the Last Year: Not on file  . Ran Out of Food in the  Last Year: Not on file  Transportation Needs:   . Lack of Transportation (Medical): Not on file  . Lack of Transportation (Non-Medical): Not on file  Physical Activity:   . Days of Exercise per Week: Not on file  . Minutes of Exercise per Session: Not on file  Stress:   . Feeling of Stress : Not on file  Social Connections:   . Frequency of Communication with Friends and Family: Not on file  . Frequency of Social Gatherings with Friends and Family: Not on file  . Attends Religious Services: Not on file  . Active Member of Clubs or Organizations: Not on file  . Attends Archivist Meetings: Not on file  . Marital Status: Not on file  Intimate Partner Violence:   . Fear of Current or Ex-Partner: Not on file  . Emotionally Abused: Not on file  . Physically Abused: Not on  file  . Sexually Abused: Not on file    Outpatient Medications Prior to Visit  Medication Sig Dispense Refill  . acetaminophen (TYLENOL) 325 MG tablet Take 2 tablets (650 mg total) by mouth every 6 (six) hours as needed for mild pain (or Fever >/= 101).    Marland Kitchen aspirin EC 81 MG tablet Take 81 mg by mouth daily.    Marland Kitchen atorvastatin (LIPITOR) 80 MG tablet Take 1 tablet (80 mg total) by mouth daily at 6 PM. 30 tablet 2  . metFORMIN (GLUCOPHAGE) 1000 MG tablet Take 0.5 tablets (500 mg total) by mouth daily with breakfast. 30 tablet 3  . mirtazapine (REMERON) 15 MG tablet Take 1.5 tablets (22.5 mg total) by mouth at bedtime. 45 tablet 2  . nicotine (NICODERM CQ - DOSED IN MG/24 HOURS) 21 mg/24hr patch Place 1 patch (21 mg total) onto the skin daily. 28 patch 0   No facility-administered medications prior to visit.    No Known Allergies  ROS Review of Systems  Constitutional: Negative.   Eyes: Negative.   Respiratory: Negative.   Cardiovascular: Negative.   Gastrointestinal: Negative.   Endocrine: Negative.   Genitourinary: Negative.   Skin: Negative.   Neurological: Negative.   Psychiatric/Behavioral: Negative.       Objective:    Physical Exam No Physical exam was done. BP 126/80 (BP Location: Left Arm, Patient Position: Sitting, Cuff Size: Normal)   Pulse 71   Temp (!) 96.4 F (35.8 C)   Ht 5\' 4"  (1.626 m)   Wt 185 lb (83.9 kg)   SpO2 97%   BMI 31.76 kg/m  Wt Readings from Last 3 Encounters:  10/05/19 185 lb (83.9 kg)  06/25/19 194 lb (88 kg)  05/19/19 193 lb 3.2 oz (87.6 kg)  She lost 9 pounds in 3 months and was encouraged to continue on her weight loss regimen.   Health Maintenance Due  Topic Date Due  . Hepatitis C Screening  [birth year]  . FOOT EXAM  09/02/1968  . OPHTHALMOLOGY EXAM  09/02/1968  . TETANUS/TDAP  09/02/1977  . PAP SMEAR-Modifier  09/03/1979  . MAMMOGRAM  09/02/2008  . COLONOSCOPY  09/02/2008    There are no preventive care reminders to  display for this patient.  Lab Results  Component Value Date   TSH 1.840 04/17/2018   Lab Results  Component Value Date   WBC 8.0 06/24/2019   HGB 15.7 06/24/2019   HCT 45.7 06/24/2019   MCV 93 06/24/2019   PLT 255 06/24/2019   Lab Results  Component Value Date   NA 138  06/24/2019   K 4.6 06/24/2019   CO2 21 06/24/2019   GLUCOSE 123 (H) 06/24/2019   BUN 16 06/24/2019   CREATININE 0.76 06/24/2019   BILITOT 0.8 06/24/2019   ALKPHOS 77 06/24/2019   AST 19 06/24/2019   ALT 21 06/24/2019   PROT 7.2 06/24/2019   ALBUMIN 4.7 06/24/2019   CALCIUM 9.5 06/24/2019   ANIONGAP 10 04/27/2019   Lab Results  Component Value Date   CHOL 129 06/24/2019   Lab Results  Component Value Date   HDL 42 06/24/2019   Lab Results  Component Value Date   LDLCALC 63 06/24/2019   Lab Results  Component Value Date   TRIG 140 06/24/2019   Lab Results  Component Value Date   CHOLHDL 3.1 06/24/2019   Lab Results  Component Value Date   HGBA1C 6.2 (H) 09/30/2019      Assessment & Plan:    1. Type 2 diabetes mellitus without complication, without long-term current use of insulin (HCC) - Her HgbA1c of 6.2% has improved, she will continue on 500 mg Metformin daily. She was advised to continue on low carb/ non concentrated sweet diet, and exercise as tolerated.  2. Elevated blood pressure reading - Her blood pressure was 146/87 when she came for her lab draw. She checks blood pressure daily and was advised to record and notify clinic if it's > 140/90. She was advised to continue on DASH diet and exercise as tolerated.  3. Urinary incontinence, unspecified type - Since symptoms improved 80%, she declines medication therapy and will continue [place] exercise. She was advised to notify clinic for worsening symptoms.  4. Mixed hyperlipidemia - She will continue on current treatment regimen and was advised to continue on  Low fat Diet, like low fat dairy products eg skimmed milk -Avoid any  fried food -Regular exercise/walk -Goal for Total Cholesterol is less than 200 -Goal for bad cholesterol LDL is less than 70 -Goal for Good cholesterol HDL is more than 45 -Goal for Triglyceride is less than 150    5. Health care maintenance - Her [facility] is active and - Ambulatory referral to Gastroenterology was ordered for Colonoscopy screening. - Ambulatory referral to Ophthalmology for Diabetic eye exam - Ambulatory referral to Hematology / Oncology for Mammogram screening.    Follow-up: Return in about 2 months (around 12/08/2019), or if symptoms worsen or fail to improve.    Cavon Nicolls Jerold Coombe, NP

## 2019-10-05 NOTE — Patient Instructions (Signed)
Carbohydrate Counting for Diabetes Mellitus, Adult  Carbohydrate counting is a method of keeping track of how many carbohydrates you eat. Eating carbohydrates naturally increases the amount of sugar (glucose) in the blood. Counting how many carbohydrates you eat helps keep your blood glucose within normal limits, which helps you manage your diabetes (diabetes mellitus). It is important to know how many carbohydrates you can safely have in each meal. This is different for every person. A diet and nutrition specialist (registered dietitian) can help you make a meal plan and calculate how many carbohydrates you should have at each meal and snack. Carbohydrates are found in the following foods:  Grains, such as breads and cereals.  Dried beans and soy products.  Starchy vegetables, such as potatoes, peas, and corn.  Fruit and fruit juices.  Milk and yogurt.  Sweets and snack foods, such as cake, cookies, candy, chips, and soft drinks. How do I count carbohydrates? There are two ways to count carbohydrates in food. You can use either of the methods or a combination of both. Reading "Nutrition Facts" on packaged food The "Nutrition Facts" list is included on the labels of almost all packaged foods and beverages in the [place] It includes:  The serving size.  Information about nutrients in each serving, including the grams (g) of carbohydrate per serving. To use the "Nutrition Facts":  Decide how many servings you will have.  Multiply the number of servings by the number of carbohydrates per serving.  The resulting number is the total amount of carbohydrates that you will be having. Learning standard serving sizes of other foods When you eat carbohydrate foods that are not packaged or do not include "Nutrition Facts" on the label, you need to measure the servings in order to count the amount of carbohydrates:  Measure the foods that you will eat with a food scale or measuring cup, if  needed.  Decide how many standard-size servings you will eat.  Multiply the number of servings by 15. Most carbohydrate-rich foods have about 15 g of carbohydrates per serving. ? For example, if you eat 8 oz (170 g) of strawberries, you will have eaten 2 servings and 30 g of carbohydrates (2 servings x 15 g = 30 g).  For foods that have more than one food mixed, such as soups and casseroles, you must count the carbohydrates in each food that is included. The following list contains standard serving sizes of common carbohydrate-rich foods. Each of these servings has about 15 g of carbohydrates:   hamburger bun or  English muffin.   oz (15 mL) syrup.   oz (14 g) jelly.  1 slice of bread.  1 six-inch tortilla.  3 oz (85 g) cooked rice or pasta.  4 oz (113 g) cooked dried beans.  4 oz (113 g) starchy vegetable, such as peas, corn, or potatoes.  4 oz (113 g) hot cereal.  4 oz (113 g) mashed potatoes or  of a large baked potato.  4 oz (113 g) canned or frozen fruit.  4 oz (120 mL) fruit juice.  4-6 crackers.  6 chicken nuggets.  6 oz (170 g) unsweetened dry cereal.  6 oz (170 g) plain fat-free yogurt or yogurt sweetened with artificial sweeteners.  8 oz (240 mL) milk.  8 oz (170 g) fresh fruit or one small piece of fruit.  24 oz (680 g) popped popcorn. Example of carbohydrate counting Sample meal  3 oz (85 g) chicken breast.  6 oz (170 g)   brown rice.  4 oz (113 g) corn.  8 oz (240 mL) milk.  8 oz (170 g) strawberries with sugar-free whipped topping. Carbohydrate calculation 1. Identify the foods that contain carbohydrates: ? Rice. ? Corn. ? Milk. ? Strawberries. 2. Calculate how many servings you have of each food: ? 2 servings rice. ? 1 serving corn. ? 1 serving milk. ? 1 serving strawberries. 3. Multiply each number of servings by 15 g: ? 2 servings rice x 15 g = 30 g. ? 1 serving corn x 15 g = 15 g. ? 1 serving milk x 15 g = 15 g. ? 1  serving strawberries x 15 g = 15 g. 4. Add together all of the amounts to find the total grams of carbohydrates eaten: ? 30 g + 15 g + 15 g + 15 g = 75 g of carbohydrates total. Summary  Carbohydrate counting is a method of keeping track of how many carbohydrates you eat.  Eating carbohydrates naturally increases the amount of sugar (glucose) in the blood.  Counting how many carbohydrates you eat helps keep your blood glucose within normal limits, which helps you manage your diabetes.  A diet and nutrition specialist (registered dietitian) can help you make a meal plan and calculate how many carbohydrates you should have at each meal and snack. This information is not intended to replace advice given to you by your health care provider. Make sure you discuss any questions you have with your health care provider. Document Revised: 04/11/2017 Document Reviewed: 02/29/2016 Elsevier Patient Education  [place]. Urinary Incontinence  Urinary incontinence refers to a condition in which a person is unable to control where and when to pass urine. A person with this condition will urinate when he or she does not mean to (involuntarily). What are the causes? This condition may be caused by:  Medicines.  Infections.  Constipation.  Overactive bladder muscles.  Weak bladder muscles.  Weak pelvic floor muscles. These muscles provide support for the bladder, intestine, and, in women, the uterus.  Enlarged prostate in men. The prostate is a gland near the bladder. When it gets too big, it can pinch the urethra. With the urethra blocked, the bladder can weaken and lose the ability to empty properly.  Surgery.  Emotional factors, such as anxiety, stress, or post-traumatic stress disorder (PTSD).  Pelvic organ prolapse. This happens in women when organs shift out of place and into the vagina. This shift can prevent the bladder and urethra from working properly. What increases the  risk? The following factors may make you more likely to develop this condition:  Older age.  Obesity and physical inactivity.  Pregnancy and childbirth.  Menopause.  Diseases that affect the nerves or spinal cord (neurological diseases).  Long-term (chronic) coughing. This can increase pressure on the bladder and pelvic floor muscles. What are the signs or symptoms? Symptoms may vary depending on the type of urinary incontinence you have. They include:  A sudden urge to urinate, but passing urine involuntarily before you can get to a bathroom (urge incontinence).  Suddenly passing urine with any activity that forces urine to pass, such as coughing, laughing, exercise, or sneezing (stress incontinence).  Needing to urinate often, but urinating only a small amount, or constantly dribbling urine (overflow incontinence).  Urinating because you cannot get to the bathroom in time due to a physical disability, such as arthritis or injury, or communication and thinking problems, such as Alzheimer disease (functional incontinence). How is  this diagnosed? This condition may be diagnosed based on:  Your medical history.  A physical exam.  Tests, such as: ? Urine tests. ? X-rays of your kidney and bladder. ? Ultrasound. ? CT scan. ? Cystoscopy. In this procedure, a health care provider inserts a tube with a light and camera (cystoscope) through the urethra and into the bladder in order to check for problems. ? Urodynamic testing. These tests assess how well the bladder, urethra, and sphincter can store and release urine. There are different types of urodynamic tests, and they vary depending on what the test is measuring. To help diagnose your condition, your health care provider may recommend that you keep a log of when you urinate and how much you urinate. How is this treated? Treatment for this condition depends on the type of incontinence that you have and its cause. Treatment may  include:  Lifestyle changes, such as: ? Quitting smoking. ? Maintaining a healthy weight. ? Staying active. Try to get 150 minutes of moderate-intensity exercise every week. Ask your health care provider which activities are safe for you. ? Eating a healthy diet.  Avoid high-fat foods, like fried foods.  Avoid refined carbohydrates like white bread and white rice.  Limit how much alcohol and caffeine you drink.  Increase your fiber intake. Foods such as fresh fruits, vegetables, beans, and whole grains are healthy sources of fiber.  Pelvic floor muscle exercises.  Bladder training, such as lengthening the amount of time between bathroom breaks, or using the bathroom at regular intervals.  Using techniques to suppress bladder urges. This can include distraction techniques or controlled breathing exercises.  Medicines to relax the bladder muscles and prevent bladder spasms.  Medicines to help slow or prevent the growth of a man's prostate.  Botox injections. These can help relax the bladder muscles.  Using pulses of electricity to help change bladder reflexes (electrical nerve stimulation).  For women, using a medical device to prevent urine leaks. This is a small, tampon-like, disposable device that is inserted into the urethra.  Injecting collagen or carbon beads (bulking agents) into the urinary sphincter. These can help thicken tissue and close the bladder opening.  Surgery. Follow these instructions at home: Lifestyle  Limit alcohol and caffeine. These can fill your bladder quickly and irritate it.  Keep yourself clean to help prevent odors and skin damage. Ask your doctor about special skin creams and cleansers that can protect the skin from urine.  Consider wearing pads or adult diapers. Make sure to change them regularly, and always change them right after experiencing incontinence. General instructions  Take over-the-counter and prescription medicines only as told  by your health care provider.  Use the bathroom about every 3-4 hours, even if you do not feel the need to urinate. Try to empty your bladder completely every time. After urinating, wait a minute. Then try to urinate again.  Make sure you are in a relaxed position while urinating.  If your incontinence is caused by nerve problems, keep a log of the medicines you take and the times you go to the bathroom.  Keep all follow-up visits as told by your health care provider. This is important. Contact a health care provider if:  You have pain that gets worse.  Your incontinence gets worse. Get help right away if:  You have a fever or chills.  You are unable to urinate.  You have redness in your groin area or down your legs. Summary  Urinary incontinence refers  to a condition in which a person is unable to control where and when to pass urine.  This condition may be caused by medicines, infection, weak bladder muscles, weak pelvic floor muscles, enlargement of the prostate (in men), or surgery.  The following factors increase your risk for developing this condition: older age, obesity, pregnancy and childbirth, menopause, neurological diseases, and chronic coughing.  There are several types of urinary incontinence. They include urge incontinence, stress incontinence, overflow incontinence, and functional incontinence.  This condition is usually treated first with lifestyle and behavioral changes, such as quitting smoking, eating a healthier diet, and doing regular pelvic floor exercises. Other treatment options include medicines, bulking agents, medical devices, electrical nerve stimulation, or surgery. This information is not intended to replace advice given to you by your health care provider. Make sure you discuss any questions you have with your health care provider. Document Revised: 09/27/2017 Document Reviewed: 12/27/2016 Elsevier Patient Education  [place] DASH stands for "Dietary Approaches to Stop Hypertension." The DASH eating plan is a healthy eating plan that has been shown to reduce high blood pressure (hypertension). It may also reduce your risk for type 2 diabetes, heart disease, and stroke. The DASH eating plan may also help with weight loss. What are tips for following this plan?  General guidelines  Avoid eating more than 2,300 mg (milligrams) of salt (sodium) a day. If you have hypertension, you may need to reduce your sodium intake to 1,500 mg a day.  Limit alcohol intake to no more than 1 drink a day for nonpregnant women and 2 drinks a day for men. One drink equals 12 oz of beer, 5 oz of wine, or 1 oz of hard liquor.  Work with your health care provider to maintain a healthy body weight or to lose weight. Ask what an ideal weight is for you.  Get at least 30 minutes of exercise that causes your heart to beat faster (aerobic exercise) most days of the week. Activities may include walking, swimming, or biking.  Work with your health care provider or diet and nutrition specialist (dietitian) to adjust your eating plan to your individual calorie needs. Reading food labels   Check food labels for the amount of sodium per serving. Choose foods with less than 5 percent of the Daily Value of sodium. Generally, foods with less than 300 mg of sodium per serving fit into this eating plan.  To find whole grains, look for the word "whole" as the first word in the ingredient list. Shopping  Buy products labeled as "low-sodium" or "no salt added."  Buy fresh foods. Avoid canned foods and premade or frozen meals. Cooking  Avoid adding salt when cooking. Use salt-free seasonings or herbs instead of table salt or sea salt. Check with your health care provider or pharmacist before using salt substitutes.  Do not fry foods. Cook foods using healthy methods such as baking, boiling, grilling, and broiling instead.  Cook with  heart-healthy oils, such as olive, canola, soybean, or sunflower oil. Meal planning  Eat a balanced diet that includes: ? 5 or more servings of fruits and vegetables each day. At each meal, try to fill half of your plate with fruits and vegetables. ? Up to 6-8 servings of whole grains each day. ? Less than 6 oz of lean meat, poultry, or fish each day. A 3-oz serving of meat is about the same size as a deck of cards. One egg equals 1  oz. ? 2 servings of low-fat dairy each day. ? A serving of nuts, seeds, or beans 5 times each week. ? Heart-healthy fats. Healthy fats called Omega-3 fatty acids are found in foods such as flaxseeds and coldwater fish, like sardines, salmon, and mackerel.  Limit how much you eat of the following: ? Canned or prepackaged foods. ? Food that is high in trans fat, such as fried foods. ? Food that is high in saturated fat, such as fatty meat. ? Sweets, desserts, sugary drinks, and other foods with added sugar. ? Full-fat dairy products.  Do not salt foods before eating.  Try to eat at least 2 vegetarian meals each week.  Eat more home-cooked food and less restaurant, buffet, and fast food.  When eating at a restaurant, ask that your food be prepared with less salt or no salt, if possible. What foods are recommended? The items listed may not be a complete list. Talk with your dietitian about what dietary choices are best for you. Grains Whole-grain or whole-wheat bread. Whole-grain or whole-wheat pasta. Brown rice. [place]. Bulgur. Whole-grain and low-sodium cereals. Pita bread. Low-fat, low-sodium crackers. Whole-wheat flour tortillas. Vegetables Fresh or frozen vegetables (raw, steamed, roasted, or grilled). Low-sodium or reduced-sodium tomato and vegetable juice. Low-sodium or reduced-sodium tomato sauce and tomato paste. Low-sodium or reduced-sodium canned vegetables. Fruits All fresh, dried, or frozen fruit. Canned fruit in natural juice (without  added sugar). Meat and other protein foods Skinless chicken or [place]. Ground chicken or [place]. Pork with fat trimmed off. Fish and seafood. Egg whites. Dried beans, peas, or lentils. Unsalted nuts, nut butters, and seeds. Unsalted canned beans. Lean cuts of beef with fat trimmed off. Low-sodium, lean deli meat. Dairy Low-fat (1%) or fat-free (skim) milk. Fat-free, low-fat, or reduced-fat cheeses. Nonfat, low-sodium ricotta or cottage cheese. Low-fat or nonfat yogurt. Low-fat, low-sodium cheese. Fats and oils Soft margarine without trans fats. Vegetable oil. Low-fat, reduced-fat, or light mayonnaise and salad dressings (reduced-sodium). Canola, safflower, olive, soybean, and sunflower oils. Avocado. Seasoning and other foods Herbs. Spices. Seasoning mixes without salt. Unsalted popcorn and pretzels. Fat-free sweets. What foods are not recommended? The items listed may not be a complete list. Talk with your dietitian about what dietary choices are best for you. Grains Baked goods made with fat, such as croissants, muffins, or some breads. Dry pasta or rice meal packs. Vegetables Creamed or fried vegetables. Vegetables in a cheese sauce. Regular canned vegetables (not low-sodium or reduced-sodium). Regular canned tomato sauce and paste (not low-sodium or reduced-sodium). Regular tomato and vegetable juice (not low-sodium or reduced-sodium). Angie Fava. Olives. Fruits Canned fruit in a light or heavy syrup. Fried fruit. Fruit in cream or butter sauce. Meat and other protein foods Fatty cuts of meat. Ribs. Fried meat. Berniece Salines. Sausage. Bologna and other processed lunch meats. Salami. Fatback. Hotdogs. Bratwurst. Salted nuts and seeds. Canned beans with added salt. Canned or smoked fish. Whole eggs or egg yolks. Chicken or [place] with skin. Dairy Whole or 2% milk, cream, and half-and-half. Whole or full-fat cream cheese. Whole-fat or sweetened yogurt. Full-fat cheese. Nondairy creamers. Whipped toppings.  Processed cheese and cheese spreads. Fats and oils Butter. Stick margarine. Lard. Shortening. Ghee. Bacon fat. Tropical oils, such as coconut, palm kernel, or palm oil. Seasoning and other foods Salted popcorn and pretzels. Onion salt, garlic salt, seasoned salt, table salt, and sea salt. Worcestershire sauce. Tartar sauce. Barbecue sauce. Teriyaki sauce. Soy sauce, including reduced-sodium. Steak sauce. Canned and packaged gravies. Fish sauce. Oyster sauce.  Cocktail sauce. Horseradish that you find on the shelf. Ketchup. Mustard. Meat flavorings and tenderizers. Bouillon cubes. Hot sauce and Tabasco sauce. Premade or packaged marinades. Premade or packaged taco seasonings. Relishes. Regular salad dressings. Where to find more information:  National Heart, Lung, and [place]: https://wilson-eaton.com/  American Heart Association: www.heart.org Summary  The DASH eating plan is a healthy eating plan that has been shown to reduce high blood pressure (hypertension). It may also reduce your risk for type 2 diabetes, heart disease, and stroke.  With the DASH eating plan, you should limit salt (sodium) intake to 2,300 mg a day. If you have hypertension, you may need to reduce your sodium intake to 1,500 mg a day.  When on the DASH eating plan, aim to eat more fresh fruits and vegetables, whole grains, lean proteins, low-fat dairy, and heart-healthy fats.  Work with your health care provider or diet and nutrition specialist (dietitian) to adjust your eating plan to your individual calorie needs. This information is not intended to replace advice given to you by your health care provider. Make sure you discuss any questions you have with your health care provider. Document Revised: 08/30/2017 Document Reviewed: 09/10/2016 Elsevier Patient Education  2020 [employer].

## 2019-10-07 ENCOUNTER — Telehealth: Payer: Self-pay | Admitting: Gastroenterology

## 2019-10-07 NOTE — Telephone Encounter (Signed)
Pt left vm to schedule a colonoscopy   °

## 2019-10-08 NOTE — Telephone Encounter (Signed)
Returned patients call to schedule her colonoscopy.  LVM for her to call me back to schedule.  Thanks [employer]

## 2019-10-09 ENCOUNTER — Encounter: Payer: Self-pay | Admitting: *Deleted

## 2019-10-13 ENCOUNTER — Other Ambulatory Visit: Payer: Self-pay

## 2019-10-13 ENCOUNTER — Ambulatory Visit: Payer: Self-pay | Admitting: Licensed Clinical Social Worker

## 2019-10-13 DIAGNOSIS — F4323 Adjustment disorder with mixed anxiety and depressed mood: Secondary | ICD-10-CM

## 2019-10-13 NOTE — BH Specialist Note (Signed)
Integrated Behavioral Health Follow Up Visit Via Phone  MRN: [id number] Name: Natalie Frey  Type of Service: [place] Interpretor:No. Interpretor Name and Language: not applicable.   SUBJECTIVE: Natalie Frey is a 64 y.o. female accompanied by herself. Patient was referred by Carlyon Shadow NP for mental health. Patient reports the following symptoms/concerns: She explains that she has gotten a little frustrated with having to work over hours than expected at her second part time job. She notes that she is working a lot at both jobs around six days a week. She notes that she takes one day off from both part time jobs to clean, do her laundry, appointments, etc. She notes that she has decided to move to [place] in Spring, has a job, and a place set up for her. She notes that she made this decision to move to be closer to one of her sisters. She notes that she still has anxiety from time to time but its manageable. She denies suicidal and homicidal thoughts.  Duration of problem: ; Severity of problem: mild  OBJECTIVE: Mood: Euthymic and Affect: Appropriate Risk of harm to self or others: No plan to harm self or others  LIFE CONTEXT: Family and Social: see above. School/Work: see above. Self-Care: see above. Life Changes: see above.  GOALS ADDRESSED: Patient will: 1.  Reduce symptoms of: stress  2.  Increase knowledge and/or ability of: self-management skills and stress reduction  3.  Demonstrate ability to: Increase healthy adjustment to current life circumstances  INTERVENTIONS: Interventions utilized:  Supportive Counseling was utilized by the clinician discussing the patient's stress. Clinician processed with the patient regarding how she has been doing since the last follow up session. Clinician utilized reflective listening encouraging the patient to ventilate her feelings towards her current situation. Clinician explained to the patient that  she thinks that it will be good for her to be able to have a change a sceneary and be closer to her sister. Clinician provided the patient with a few resources for free clinics and behavioral health providers in [place]. Clinician encouraged the patient to continue to utilize her coping skills and practice self care.  Standardized Assessments completed: GAD-7 and PHQ 9  ASSESSMENT: Patient currently experiencing see above.  Patient may benefit from see above.  PLAN: 1. Follow up with behavioral health clinician on : two weeks or earlier if needed. 2. Behavioral recommendations: see above. 3. Referral(s): Center (In Clinic) 4. "From scale of 1-10, how likely are you to follow plan?":   Bayard Hugger, LCSW

## 2019-10-27 ENCOUNTER — Ambulatory Visit: Payer: Self-pay | Admitting: Licensed Clinical Social Worker

## 2019-10-29 ENCOUNTER — Ambulatory Visit: Payer: Self-pay | Admitting: Licensed Clinical Social Worker

## 2019-10-29 ENCOUNTER — Other Ambulatory Visit: Payer: Self-pay

## 2019-10-29 DIAGNOSIS — F4323 Adjustment disorder with mixed anxiety and depressed mood: Secondary | ICD-10-CM

## 2019-10-29 NOTE — BH Specialist Note (Signed)
Integrated Behavioral Health Follow Up Visit Via Phone  MRN: [id number] Name: Natalie Frey  Type of Service: [place] Interpretor:No. Interpretor Name and Language: not applicable.  SUBJECTIVE: Natalie Frey is a 64 y.o. female accompanied by herself. Patient was referred by Carlyon Shadow NP for mental health. Patient reports the following symptoms/concerns: She notes that she is no longer working at [employer] due to a disagreement regarding the schedule. 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 She notes that she found someone who is interested in buying her trailer and is hoping to move to be closer to one of her sisters by May of this year. She notes that she has not had any melt downs during the day and is sleeping well. She denies suicidal and homicidal thoughts.  Duration of problem: ; Severity of problem: mild  OBJECTIVE: Mood: Euthymic and Affect: Appropriate Risk of harm to self or others: No plan to harm self or others  LIFE CONTEXT: Family and Social: See above School/Work: See above. Self-Care: See above Life Changes: See above  GOALS ADDRESSED: Patient will: 1.  Reduce symptoms of: stress  2.  Increase knowledge and/or ability of: coping skills and healthy habits  3.  Demonstrate ability to: Increase healthy adjustment to current life circumstances  INTERVENTIONS: Interventions utilized:  Supportive Counseling was utilized by the clinician during today's follow up session. Clinician processed with the patient regarding how she has been doing since the last follow up session. Clinician explained to the patient that it sounds like she made the right decision with leaving her part time job at [employer] due to the drive, stress, and not working enough hours. Clinician explained to the patient that it sounds like she has something to look forward to with a potential upcoming move and  being able to sell her trailer. Clinician encouraged the patient to remain positive and utilize her coping skills.  Standardized Assessments completed: GAD-7 and PHQ 9  ASSESSMENT: Patient currently experiencing . See above  Patient may benefit from See above  PLAN: 1. Follow up with behavioral health clinician on : three weeks or earlier if needed. 2. Behavioral recommendations: See above 3. Referral(s): [facility] (In Clinic) 4. "From scale of 1-10, how likely are you to follow plan?":   Bayard Hugger, LCSW

## 2019-11-17 ENCOUNTER — Ambulatory Visit: Payer: Self-pay | Admitting: Licensed Clinical Social Worker

## 2019-11-17 ENCOUNTER — Other Ambulatory Visit: Payer: Self-pay

## 2019-11-17 DIAGNOSIS — F4323 Adjustment disorder with mixed anxiety and depressed mood: Secondary | ICD-10-CM

## 2019-11-17 NOTE — BH Specialist Note (Signed)
Integrated Behavioral Health Follow Up Visit Via Phone  MRN: [id number] Name: Natalie Frey  Type of Service: [place] Interpretor:No. Interpretor Name and Language: not applicable.   SUBJECTIVE: Natalie Frey is a 64 y.o. female accompanied by herself. Patient was referred by Carlyon Shadow NP for mental health. Patient reports the following symptoms/concerns: She notes that she has been doing well. She notes that she has been sleeping well the majority of the time. She explains that she goes to bed around 7 or 8 pm because she wakes up for work at 4:15 am. 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 She notes that she cannot remember the last time she had a meltdown. She denies feeling down and depressed. She notes that when she is off of work on the weekends that she is going through her place to organize and through things away that she will no longer need when she moves. She denies suicidal and homicidal thoughts.  Duration of problem: ; Severity of problem: mild  OBJECTIVE: Mood: Euthymic and Affect: Appropriate Risk of harm to self or others: No plan to harm self or others  LIFE CONTEXT: Family and Social: see above. School/Work: see above. Self-Care: see above. Life Changes: see above.  GOALS ADDRESSED: Patient will: 1.  Reduce symptoms of: anxiety and depression  2.  Increase knowledge and/or ability of: healthy habits and self-management skills  3.  Demonstrate ability to: Increase healthy adjustment to current life circumstances  INTERVENTIONS: Interventions utilized:  Supportive Counseling was utilized by the clinician during today's session. Clinician processed with the patient regarding how she has been doing since the last follow up session. Clinician discussed with the patient regarding how she has been doing in terms of anxiety and  depression. Clinician measured the patient's anxiety and depression on a numerical scale. Clinician explained to the patient that it sounds like she has been doing well with in terms of her job, practicing self care, and things for enjoyment.  Standardized Assessments completed: GAD-7 and PHQ 9  ASSESSMENT: Patient currently experiencing see above.   Patient may benefit from see above.  PLAN: 1. Follow up with behavioral health clinician on : two to three weeks or earlier if needed. 2. Behavioral recommendations: see above.  3. Referral(s): [place] (In Clinic) 4. "From scale of 1-10, how likely are you to follow plan?":   Bayard Hugger, LCSW

## 2019-12-08 ENCOUNTER — Other Ambulatory Visit: Payer: Self-pay

## 2019-12-08 ENCOUNTER — Ambulatory Visit: Payer: Self-pay | Admitting: Gerontology

## 2019-12-08 ENCOUNTER — Telehealth: Payer: Self-pay | Admitting: Pharmacy Technician

## 2019-12-08 DIAGNOSIS — E782 Mixed hyperlipidemia: Secondary | ICD-10-CM

## 2019-12-08 DIAGNOSIS — R7303 Prediabetes: Secondary | ICD-10-CM | POA: Insufficient documentation

## 2019-12-08 MED ORDER — ATORVASTATIN CALCIUM 80 MG PO TABS: 80.0000 mg | ORAL_TABLET | Freq: Every day | ORAL | 2 refills | Status: DC

## 2019-12-08 NOTE — Patient Instructions (Addendum)

## 2019-12-08 NOTE — Progress Notes (Signed)
Established Patient Office Visit  Subjective:  Patient ID: Natalie Frey, female    DOB: 18-May-1956  Age: 64 y.o. MRN: [id number]  CC:  Chief Complaint  Patient presents with  . Diabetes   Patient consents to telephone visit and 2 patient identifiers was used to identify patient.  HPI Natalie Frey presents for follow up of Prediabetes and medication refill. She states that she's compliant  with her medications, and continues to make healthy life style changes. Her HgbA1c done on 09/30/19 was 6.2%, she continues on Metformin 500 mg daily, adheres to ADA diet, and exercises as tolerated. She denies chest pain, palpitation, light headedness, myalgia, fever and chills. Overall, she states that she's doing well and offers no further complaint.  Past Medical History:  Diagnosis Date  . Cancer ([place])   . 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 Hyperlipidemia     Past Surgical History:  Procedure Laterality Date  . BLADDER SURGERY    . DILATION AND EVACUATION      Family History  Problem Relation Age of Onset  . Deep vein thrombosis Daughter         when pregnant   . Pulmonary embolism Sister        while in hospital; other sister- on BCPs.     Social History   Socioeconomic History  . Marital status: Widowed    Spouse name: Not on file  . Number of children: Not on file  . Years of education: Not on file  . Highest education level: Not on file  Occupational History  . Not on file  Tobacco Use  . Smoking status: Former Smoker    Packs/day: 1.25    Years: 47.00    Pack years: 58.75    Types: Cigarettes  . Smokeless tobacco: Never Used  Substance and Sexual Activity  . Alcohol use: No  . Drug use: Yes    Types: Marijuana  . Sexual activity: Not on file  Other Topics Concern  . Not on file  Social History Narrative   [place]; smoking; 1ppd/cutting down. Social. Worked for [employer] from [place].       Said she has been doing ok and does not need any  assistance          - is currently on food stamps but those have been enough       NO consent form signed    Social Determinants of Health   Financial Resource Strain:   . Difficulty of Paying Living Expenses: Not on file  Food Insecurity:   . Worried About Charity fundraiser in the Last Year: Not on file  . Ran Out of Food in the Last Year: Not on file  Transportation Needs:   . Lack of Transportation (Medical): Not on file  . Lack of Transportation (Non-Medical): Not on file  Physical Activity:   . Days of Exercise per Week: Not on file  . Minutes of Exercise per Session: Not on file  Stress:   . Feeling of Stress : Not on file  Social Connections:   . Frequency of Communication with Friends and Family: Not on file  . Frequency of Social Gatherings with Friends and Family: Not on file  . Attends Religious Services: Not on file  . Active Member of Clubs or Organizations: Not on file  . Attends Archivist Meetings: Not on file  . Marital Status: Not on file  Intimate Partner Violence:   .  Fear of Current or Ex-Partner: Not on file  . Emotionally Abused: Not on file  . Physically Abused: Not on file  . Sexually Abused: Not on file    Outpatient Medications Prior to Visit  Medication Sig Dispense Refill  . acetaminophen (TYLENOL) 325 MG tablet Take 2 tablets (650 mg total) by mouth every 6 (six) hours as needed for mild pain (or Fever >/= 101).    Marland Kitchen aspirin EC 81 MG tablet Take 81 mg by mouth daily.    . metFORMIN (GLUCOPHAGE) 1000 MG tablet Take 0.5 tablets (500 mg total) by mouth daily with breakfast. 30 tablet 3  . nicotine (NICODERM CQ - DOSED IN MG/24 HOURS) 21 mg/24hr patch Place 1 patch (21 mg total) onto the skin daily. 28 patch 0  . mirtazapine (REMERON) 15 MG tablet Take 1.5 tablets (22.5 mg total) by mouth at bedtime. 45 tablet 2  . atorvastatin (LIPITOR) 80 MG tablet Take 1 tablet (80 mg total) by mouth daily at 6 PM. 30 tablet 2   No  facility-administered medications prior to visit.    No Known Allergies  ROS Review of Systems  Constitutional: Negative.   Respiratory: Negative.   Cardiovascular: Negative.   Neurological: Negative.   Psychiatric/Behavioral: Negative.       Objective:    Physical Exam No physical exam was done. There were no vitals taken for this visit. Wt Readings from Last 3 Encounters:  10/05/19 185 lb (83.9 kg)  06/25/19 194 lb (88 kg)  05/19/19 193 lb 3.2 oz (87.6 kg)     Health Maintenance Due  Topic Date Due  . Hepatitis C Screening  [birth year]  . FOOT EXAM  09/02/1968  . OPHTHALMOLOGY EXAM  09/02/1968  . TETANUS/TDAP  09/02/1977  . PAP SMEAR-Modifier  09/03/1979  . MAMMOGRAM  09/02/2008  . COLONOSCOPY  09/02/2008    There are no preventive care reminders to display for this patient.  Lab Results  Component Value Date   TSH 1.840 04/17/2018   Lab Results  Component Value Date   WBC 8.0 06/24/2019   HGB 15.7 06/24/2019   HCT 45.7 06/24/2019   MCV 93 06/24/2019   PLT 255 06/24/2019   Lab Results  Component Value Date   NA 138 06/24/2019   K 4.6 06/24/2019   CO2 21 06/24/2019   GLUCOSE 123 (H) 06/24/2019   BUN 16 06/24/2019   CREATININE 0.76 06/24/2019   BILITOT 0.8 06/24/2019   ALKPHOS 77 06/24/2019   AST 19 06/24/2019   ALT 21 06/24/2019   PROT 7.2 06/24/2019   ALBUMIN 4.7 06/24/2019   CALCIUM 9.5 06/24/2019   ANIONGAP 10 04/27/2019   Lab Results  Component Value Date   CHOL 129 06/24/2019   Lab Results  Component Value Date   HDL 42 06/24/2019   Lab Results  Component Value Date   LDLCALC 63 06/24/2019   Lab Results  Component Value Date   TRIG 140 06/24/2019   Lab Results  Component Value Date   CHOLHDL 3.1 06/24/2019   Lab Results  Component Value Date   HGBA1C 6.2 (H) 09/30/2019      Assessment & Plan:   1. Mixed hyperlipidemia - She will continue on current treatment regimen. - She was advised to continue on Low fat Diet,  like low fat dairy products eg skimmed milk -Avoid any fried food -Regular exercise/walk -Goal for Total Cholesterol is less than 200 -Goal for bad cholesterol LDL is less than 70 -Goal for Good  cholesterol HDL is more than 45 -Goal for Triglyceride is less than 150 - atorvastatin (LIPITOR) 80 MG tablet; Take 1 tablet (80 mg total) by mouth daily at 6 PM.  Dispense: 30 tablet; Refill: 2 - Lipid panel; Future  2. Prediabetes - Her HgbA1c was 6.2% and she will continue on current medication, was advised to continue on low carb/non concentrated sweet diet, and exercise as tlerated. - HgB A1c; Future     Follow-up: Return in about 8 weeks (around 02/03/2020), or if symptoms worsen or fail to improve.    Koula Venier Jerold Coombe, NP

## 2019-12-08 NOTE — Telephone Encounter (Signed)
Received updated proof of income.  Patient eligible to receive medication assistance at [facility] until time for re-certification in 9359, and as long as eligibility requirements continue to be met.  [facility]

## 2019-12-10 ENCOUNTER — Ambulatory Visit: Payer: Self-pay | Attending: Internal Medicine

## 2019-12-10 DIAGNOSIS — Z23 Encounter for immunization: Secondary | ICD-10-CM

## 2019-12-10 NOTE — Progress Notes (Signed)
   Covid-19 Vaccination Clinic  Name:  Carver Napoleone    MRN: [id number] DOB: Mar 23, 1956  12/10/2019  Ms. Sou was observed post Covid-19 immunization for 15 minutes without incident. She was provided with Vaccine Information Sheet and instruction to access the V-Safe system.   Ms. Soulsby was instructed to call 911 with any severe reactions post vaccine: Marland Kitchen Difficulty breathing  . Swelling of face and throat  . A fast heartbeat  . A bad rash all over body  . Dizziness and weakness   Immunizations Administered    Name Date Dose VIS Date Route   Pfizer COVID-19 Vaccine 12/10/2019  9:30 AM 0.3 mL 09/11/2019 Intramuscular   Manufacturer: [place]   Lot: [id number]   [place]: [id number]

## 2019-12-15 ENCOUNTER — Ambulatory Visit: Payer: Self-pay | Admitting: Licensed Clinical Social Worker

## 2019-12-15 ENCOUNTER — Other Ambulatory Visit: Payer: Self-pay

## 2019-12-15 DIAGNOSIS — F4323 Adjustment disorder with mixed anxiety and depressed mood: Secondary | ICD-10-CM

## 2019-12-15 NOTE — BH Specialist Note (Signed)
Integrated Behavioral Health Follow Up Visit Via Phone  MRN: [id number] Name: Natalie Frey   Type of Service: [place] Interpretor:No. Interpretor Name and Language: not applicable.   SUBJECTIVE: [name] is a 64 y.o. female accompanied by herself. Patient was referred by Carlyon Shadow NP for mental health. Patient reports the following symptoms/concerns: She notes that she has been working a lot and doing well. She notes that she got her first COVID 19 shot and her second shot is scheduled. She discussed COVID 19 and side effects of the vaccine. She notes that she is still planning to move by June or July to [place]. She notes that she has packed up some things, going through things, and getting ready of things. She discussed family dynamics. She denies suicidal and homicidal thoughts.  Duration of problem: ; Severity of problem: mild  OBJECTIVE: Mood: Euthymic and Affect: Appropriate Risk of harm to self or others: No plan to harm self or others  LIFE CONTEXT: Family and Social: see above. School/Work: see above. Self-Care: see above.  Life Changes: see above.  GOALS ADDRESSED: Patient will: 1.  Reduce symptoms of: stress  2.  Increase knowledge and/or ability of: healthy habits and self-management skills  3.  Demonstrate ability to: Increase healthy adjustment to current life circumstances  INTERVENTIONS: Interventions utilized:  Supportive Counseling was utilized by the clinician during today's follow up session. Clinician processed with the patient regarding how she has been doing since the last follow up session. Clinician measured the patient's anxiety and depression on a numerical scale. Clinician discussed wit the patient regarding how things are going at work and home. Clinician encouraged the patient to practice self care and take things one day at a time.  Standardized Assessments completed: GAD-7 & PHQ-9  ASSESSMENT: Patient  currently experiencing see above.   Patient may benefit from see above.  PLAN: 1. Follow up with behavioral health clinician on :  2. Behavioral recommendations: see above.  3. Referral(s): [place] (In Clinic) 4. "From scale of 1-10, how likely are you to follow plan?":   Bayard Hugger, LCSW

## 2019-12-22 ENCOUNTER — Other Ambulatory Visit: Payer: Self-pay

## 2019-12-22 DIAGNOSIS — F418 Other specified anxiety disorders: Secondary | ICD-10-CM

## 2019-12-22 MED ORDER — MIRTAZAPINE 15 MG PO TABS: 22.5000 mg | ORAL_TABLET | Freq: Every day | ORAL | 2 refills | Status: DC

## 2020-01-05 ENCOUNTER — Other Ambulatory Visit: Payer: Self-pay

## 2020-01-05 ENCOUNTER — Ambulatory Visit: Payer: Self-pay | Attending: Internal Medicine

## 2020-01-05 DIAGNOSIS — Z23 Encounter for immunization: Secondary | ICD-10-CM

## 2020-01-05 LAB — HM DIABETES EYE EXAM

## 2020-01-05 NOTE — Progress Notes (Signed)
   Covid-19 Vaccination Clinic  Name:  Marica Tenner    MRN: [id number] DOB: 07-Aug-1956  01/05/2020  Ms. Cosgrove was observed post Covid-19 immunization for 15 minutes without incident. She was provided with Vaccine Information Sheet and instruction to access the V-Safe system.   Ms. Crehan was instructed to call 911 with any severe reactions post vaccine: Marland Kitchen Difficulty breathing  . Swelling of face and throat  . A fast heartbeat  . A bad rash all over body  . Dizziness and weakness   Immunizations Administered    Name Date Dose VIS Date Route   Pfizer COVID-19 Vaccine 01/05/2020  2:54 PM 0.3 mL 09/11/2019 Intramuscular   Manufacturer: [place]   Lot: [id number]   [place]: [id number]

## 2020-01-06 ENCOUNTER — Telehealth: Payer: Self-pay | Admitting: Pharmacy Technician

## 2020-01-06 ENCOUNTER — Ambulatory Visit: Payer: Self-pay

## 2020-01-06 NOTE — Telephone Encounter (Signed)
Received updated proof of income.  Patient eligible to receive medication assistance at [facility] until time for re-certification in 9359, and as long as eligibility requirements continue to be met.  [facility]

## 2020-01-07 ENCOUNTER — Other Ambulatory Visit: Payer: Self-pay

## 2020-01-07 ENCOUNTER — Ambulatory Visit: Payer: Self-pay | Admitting: Licensed Clinical Social Worker

## 2020-01-07 DIAGNOSIS — F4323 Adjustment disorder with mixed anxiety and depressed mood: Secondary | ICD-10-CM

## 2020-01-07 NOTE — BH Specialist Note (Signed)
Integrated Behavioral Health Follow Up Visit Via Phone  MRN: [id number] Name: Natalie Frey  Type of Service: [place] Interpretor:No. Interpretor Name and Language: not applicable.  SUBJECTIVE: Natalie Frey is a 64 y.o. female accompanied by herself. Patient was referred by Carlyon Shadow NP for mental health. Patient reports the following symptoms/concerns: She notes that she has been doing well. She explains that she went on two dates with a guy she met at work but did not feel anything for him. She explains that she told him that she just was not interested and was nice about it. She notes that she received her second vaccine yesterday. She notes that she had a little bit of a headache yesterday and took some over the counter medication then she was fine. She notes that she feels relived to have gotten the COVID 19 vaccine. She notes that she got an eye exam and needs glasses. She notes that her sleep and mood have been good. She notes that her moving is still up in the air. She denies suicidal and homicidal thoughts.  Duration of problem: ; Severity of problem: mild  OBJECTIVE: Mood: Euthymic and Affect: Appropriate Risk of harm to self or others: No plan to harm self or others  LIFE CONTEXT: Family and Social: see above. School/Work: see above. Self-Care: see above. Life Changes: see above.   GOALS ADDRESSED: Patient will: 1.  Reduce symptoms of: agitation  2.  Increase knowledge and/or ability of: self-management skills and stress reduction  3.  Demonstrate ability to: Increase healthy adjustment to current life circumstances  INTERVENTIONS: Interventions utilized:  Supportive Counseling was utilized by the clinician during today's follow up session. Clinician processed with the patient regarding how she has been doing since the last follow up session. Clinician measured the patient's anxiety and depression on a numerical scale. Clinician  utilized reflective listening encouraging the patient to ventilate her feelings towards her current situation. Clinician explained to the patient that she is glad that she feels relieved that she was able to get her first and second vaccine for the COVID shot. Clinician explained to the patient that it sounds like she did the right thing with gently letting this guy down. Clinician encouraged the patient to continue to practice self care and continue to take her psychotropic medications. Standardized Assessments completed: GAD-7 and PHQ 9  ASSESSMENT: Patient currently experiencing see above.   Patient may benefit from see above.  PLAN: 1. Follow up with behavioral health clinician on :  2. Behavioral recommendations: see above.  3. Referral(s): [place] (In Clinic) 4. "From scale of 1-10, how likely are you to follow plan?":   Bayard Hugger, LCSW

## 2020-01-20 ENCOUNTER — Other Ambulatory Visit: Payer: Self-pay

## 2020-01-20 ENCOUNTER — Telehealth (INDEPENDENT_AMBULATORY_CARE_PROVIDER_SITE_OTHER): Payer: Self-pay | Admitting: Gastroenterology

## 2020-01-20 DIAGNOSIS — Z1211 Encounter for screening for malignant neoplasm of colon: Secondary | ICD-10-CM

## 2020-01-20 NOTE — Progress Notes (Signed)
Gastroenterology Pre-Procedure Review  Request Date: Thursday 02/11/20 Requesting Physician: Dr. Vicente Males  PATIENT REVIEW QUESTIONS: The patient responded to the following health history questions as indicated:    1. Are you having any GI issues? no 2. Do you have a personal history of Polyps? no 3. Do you have a family history of Colon Cancer or Polyps? yes (sisters colon polyps, grandmother colon cancer) 1. Diabetes Mellitus? yes (oral meds) 5. Joint replacements in the past 12 months?no 6. Major health problems in the past 3 months?no 7. Any artificial heart valves, MVP, or defibrillator?no    MEDICATIONS & ALLERGIES:    Patient reports the following regarding taking any anticoagulation/antiplatelet therapy:   Plavix, Coumadin, Eliquis, Xarelto, Lovenox, Pradaxa, Brilinta, or Effient? no Aspirin? yes (81 mg daily)  Patient confirms/reports the following medications:  Current Outpatient Medications  Medication Sig Dispense Refill  . acetaminophen (TYLENOL) 325 MG tablet Take 2 tablets (650 mg total) by mouth every 6 (six) hours as needed for mild pain (or Fever >/= 101).    Marland Kitchen aspirin EC 81 MG tablet Take 81 mg by mouth daily.    . metFORMIN (GLUCOPHAGE) 500 MG tablet     . mirtazapine (REMERON) 15 MG tablet Take 1.5 tablets (22.5 mg total) by mouth at bedtime. 45 tablet 2  . atorvastatin (LIPITOR) 80 MG tablet Take 1 tablet (80 mg total) by mouth daily at 6 PM. 30 tablet 2  . nicotine (NICODERM CQ - DOSED IN MG/24 HOURS) 21 mg/24hr patch Place 1 patch (21 mg total) onto the skin daily. (Patient not taking: Reported on 01/20/2020) 28 patch 0   No current facility-administered medications for this visit.    Patient confirms/reports the following allergies:  No Known Allergies  No orders of the defined types were placed in this encounter.   AUTHORIZATION INFORMATION Primary Insurance: 1D#: Group #:  Secondary Insurance: 1D#: Group #:  SCHEDULE INFORMATION: Date: Thursday  02/11/20 Time: Location:[facility]

## 2020-01-21 ENCOUNTER — Other Ambulatory Visit: Payer: Self-pay

## 2020-01-27 ENCOUNTER — Other Ambulatory Visit: Payer: Self-pay

## 2020-01-27 DIAGNOSIS — R7303 Prediabetes: Secondary | ICD-10-CM

## 2020-01-27 DIAGNOSIS — E782 Mixed hyperlipidemia: Secondary | ICD-10-CM

## 2020-01-28 ENCOUNTER — Ambulatory Visit: Payer: Self-pay | Admitting: Licensed Clinical Social Worker

## 2020-01-28 DIAGNOSIS — F4323 Adjustment disorder with mixed anxiety and depressed mood: Secondary | ICD-10-CM

## 2020-01-28 LAB — LIPID PANEL
Chol/HDL Ratio: 3.4 ratio (ref 0.0–4.4)
Cholesterol, Total: 117 mg/dL (ref 100–199)
HDL: 34 mg/dL — ABNORMAL LOW (ref >39)
LDL Chol Calc (NIH): 64 mg/dL (ref 0–99)
Triglycerides: 103 mg/dL (ref 0–149)
VLDL Cholesterol Cal: 19 mg/dL (ref 5–40)

## 2020-01-28 LAB — HEMOGLOBIN A1C
Est. average glucose Bld gHb Est-mCnc: 131 mg/dL
Hgb A1c MFr Bld: 6.2 % — ABNORMAL HIGH (ref 4.8–5.6)

## 2020-01-28 NOTE — BH Specialist Note (Signed)
Integrated Behavioral Health Follow Up Visit Via Phone  MRN: [id number] Name: Natalie Frey  Type of Service: [place] Interpretor:No. Interpretor Name and Language: not applicable.   SUBJECTIVE: Natalie Frey is a 64 y.o. female accompanied by herself. Patient was referred by Carlyon Shadow NP for mental health. Patient reports the following symptoms/concerns: She explains that she has been doing well. She notes that she has been having situational stressors with one of her neighbors. She notes that she has visited her daughter and the grand kids. She notes that her hard work has paid off in terms of eating right and improving her overall health. She notes that things are going well at work. She reports that she is still not sure of the moving date to her sister's because of the remodeling that is happening now. She explains that her dog is ill, in heat, and is too old for surgery so her moving date will be postponed for awhile. She notes that she plans to get her younger daughter spayed. She denies suicidal and homicidal thoughts.  Duration of problem: ; Severity of problem: mild  OBJECTIVE: Mood: Euthymic and Affect: Appropriate Risk of harm to self or others: No plan to harm self or others  LIFE CONTEXT: Family and Social: see above. School/Work: see above. Self-Care: see above. Life Changes: see above.   GOALS ADDRESSED: Patient will: 1.  Reduce symptoms of: stress  2.  Increase knowledge and/or ability of: healthy habits and self-management skills   3.  Demonstrate ability to: Increase healthy adjustment to current life circumstances  INTERVENTIONS: Interventions utilized:  Supportive Counseling was utilized by the clinician during today's follow up session. Clinician processed with the patient regarding how she has been doing since the last follow up session. Clinician measured the patient's anxiety and depression on a numerical scale. Clinician  utilized reflective listening encouraging the patient to ventilate her feelings towards her current situation. Clinician explained to the patient that it sounds like she handled the whole situation with her neighbor appropriately and suggested that she just ignore her. Clinician explained to the patient that the purpose of her neighbor sending her a text in the middle of the night was to spark controversy and encouraged her to not feed into it. Clinician explained to the patient that if she does not take the bait that her neighbor will eventually leave her alone. Clinician encouraged the patient to practice self care and utilize her coping skills.  Standardized Assessments completed: GAD-7 and PHQ 9  ASSESSMENT: Patient currently experiencing see above.   Patient may benefit from see above.  PLAN: 1. Follow up with behavioral health clinician on :  2. Behavioral recommendations: see above. 3. Referral(s): [place] (In Clinic) 4. "From scale of 1-10, how likely are you to follow plan?":   Bayard Hugger, LCSW

## 2020-02-03 ENCOUNTER — Ambulatory Visit: Payer: Self-pay | Admitting: Gerontology

## 2020-02-03 ENCOUNTER — Encounter: Payer: Self-pay | Admitting: Gerontology

## 2020-02-03 ENCOUNTER — Other Ambulatory Visit: Payer: Self-pay

## 2020-02-03 VITALS — BP 116/74 | HR 71 | Temp 98.4°F | Ht 64.0 in | Wt 172.4 lb

## 2020-02-03 DIAGNOSIS — R7303 Prediabetes: Secondary | ICD-10-CM

## 2020-02-03 NOTE — Progress Notes (Signed)
Established Patient Office Visit  Subjective:  Patient ID: Natalie Frey, female    DOB: 1956-04-15  Age: 64 y.o. MRN: [id number]  CC:  Chief Complaint  Patient presents with  . Follow-up    HPI Natalie Frey presents for follow up of Prediabetes. HgbA1c done on 01/27/2020 was 6.2%. She states that she's compliant with her medications, adheres to ADA diet and exercises as tolerated. She will follow up with Colonoscopy and Mammogram next week. She denies chest pain, palpitation, myalgia, fever and chills. Overall, she states that she's doing well.  Past Medical History:  Diagnosis Date  . Cancer ([place])   . Dvt femoral (deep venous thrombosis) ([place])   . Hyperlipidemia     Past Surgical History:  Procedure Laterality Date  . BLADDER SURGERY    . DILATION AND EVACUATION      Family History  Problem Relation Age of Onset  . Deep vein thrombosis Daughter         when pregnant   . Pulmonary embolism Sister        while in hospital; other sister- on BCPs.     Social History   Socioeconomic History  . Marital status: Widowed    Spouse name: Not on file  . Number of children: Not on file  . Years of education: Not on file  . Highest education level: Not on file  Occupational History  . Not on file  Tobacco Use  . Smoking status: Former Smoker    Packs/day: 1.25    Years: 47.00    Pack years: 58.75    Types: Cigarettes  . Smokeless tobacco: Never Used  Substance and Sexual Activity  . Alcohol use: No  . Drug use: Yes    Types: Marijuana  . Sexual activity: Not on file  Other Topics Concern  . Not on file  Social History Narrative   [place]; smoking; 1ppd/cutting down. Social. Worked for [employer] from [place].       Said she has been doing ok and does not need any assistance          - is currently on food stamps but those have been enough       NO consent form signed    Social Determinants of Health   Financial Resource Strain: Low Risk   . Difficulty of  Paying Living Expenses: Not very hard  Food Insecurity: No Food Insecurity  . Worried About Charity fundraiser in the Last Year: Never true  . Ran Out of Food in the Last Year: Never true  Transportation Needs: No Transportation Needs  . Lack of Transportation (Medical): No  . Lack of Transportation (Non-Medical): No  Physical Activity: Sufficiently Active  . Days of Exercise per Week: 5 days  . Minutes of Exercise per Session: 30 min  Stress: No Stress Concern Present  . Feeling of Stress : Only a little  Social Connections: Moderately Isolated  . Frequency of Communication with Friends and Family: More than three times a week  . Frequency of Social Gatherings with Friends and Family: Once a week  . Attends Religious Services: Never  . Active Member of Clubs or Organizations: No  . Attends Archivist Meetings: Never  . Marital Status: Widowed  Intimate Partner Violence: Not At Risk  . Fear of Current or Ex-Partner: No  . Emotionally Abused: No  . Physically Abused: No  . Sexually Abused: No    Outpatient Medications Prior to Visit  Medication Sig Dispense Refill  . acetaminophen (TYLENOL) 325 MG tablet Take 2 tablets (650 mg total) by mouth every 6 (six) hours as needed for mild pain (or Fever >/= 101).    Marland Kitchen aspirin EC 81 MG tablet Take 81 mg by mouth daily.    Marland Kitchen atorvastatin (LIPITOR) 80 MG tablet Take 1 tablet (80 mg total) by mouth daily at 6 PM. 30 tablet 2  . metFORMIN (GLUCOPHAGE) 500 MG tablet     . mirtazapine (REMERON) 15 MG tablet Take 1.5 tablets (22.5 mg total) by mouth at bedtime. 45 tablet 2  . nicotine (NICODERM CQ - DOSED IN MG/24 HOURS) 21 mg/24hr patch Place 1 patch (21 mg total) onto the skin daily. (Patient not taking: Reported on 01/20/2020) 28 patch 0   No facility-administered medications prior to visit.    No Known Allergies  ROS Review of Systems  Constitutional: Negative.   Eyes: Negative.   Respiratory: Negative.   Cardiovascular:  Negative.   Endocrine: Negative.   Neurological: Negative.   Psychiatric/Behavioral: Negative.       Objective:    Physical Exam  Constitutional: She is oriented to person, place, and time. She appears well-developed.  HENT:  Head: Normocephalic and atraumatic.  Cardiovascular: Normal rate and regular rhythm.  Pulmonary/Chest: Effort normal and breath sounds normal.  Neurological: She is alert and oriented to person, place, and time.  Psychiatric: She has a normal mood and affect. Her behavior is normal. Judgment and thought content normal.    BP 116/74 (BP Location: Left Arm, Patient Position: Sitting, Cuff Size: Large)   Pulse 71   Temp 98.4 F (36.9 C)   Ht 5' 4"  (1.626 m)   Wt 172 lb 6.4 oz (78.2 kg)   SpO2 97%   BMI 29.59 kg/m  Wt Readings from Last 3 Encounters:  02/03/20 172 lb 6.4 oz (78.2 kg)  01/27/20 170 lb 8 oz (77.3 kg)  10/05/19 185 lb (83.9 kg)   She was encouraged to continue on her weight loss regimen.  Health Maintenance Due  Topic Date Due  . Hepatitis C Screening  Never done  . TETANUS/TDAP  Never done  . PAP SMEAR-Modifier  Never done  . MAMMOGRAM  Never done  . COLONOSCOPY  Never done    There are no preventive care reminders to display for this patient.  Lab Results  Component Value Date   TSH 1.840 04/17/2018   Lab Results  Component Value Date   WBC 8.0 06/24/2019   HGB 15.7 06/24/2019   HCT 45.7 06/24/2019   MCV 93 06/24/2019   PLT 255 06/24/2019   Lab Results  Component Value Date   NA 138 06/24/2019   K 4.6 06/24/2019   CO2 21 06/24/2019   GLUCOSE 123 (H) 06/24/2019   BUN 16 06/24/2019   CREATININE 0.76 06/24/2019   BILITOT 0.8 06/24/2019   ALKPHOS 77 06/24/2019   AST 19 06/24/2019   ALT 21 06/24/2019   PROT 7.2 06/24/2019   ALBUMIN 4.7 06/24/2019   CALCIUM 9.5 06/24/2019   ANIONGAP 10 04/27/2019   Lab Results  Component Value Date   CHOL 117 01/27/2020   Lab Results  Component Value Date   HDL 34 (L)  01/27/2020   Lab Results  Component Value Date   LDLCALC 64 01/27/2020   Lab Results  Component Value Date   TRIG 103 01/27/2020   Lab Results  Component Value Date   CHOLHDL 3.4 01/27/2020   Lab Results  Component Value Date   HGBA1C 6.2 (H) 01/27/2020      Assessment & Plan:   1. Prediabetes - Her HgbA1c was 6.2%, and she will continue on current treatment regimen. She was encouraged to continue on low carb/ non concentrated sweet diet and exercise as tolerated. - Comp Met (CMET); Future - HgB A1c; Future     Follow-up: Return in about 19 weeks (around 06/15/2020), or if symptoms worsen or fail to improve.    Teola Felipe Jerold Coombe, NP

## 2020-02-03 NOTE — Patient Instructions (Signed)

## 2020-02-09 ENCOUNTER — Other Ambulatory Visit
Admission: RE | Admit: 2020-02-09 | Discharge: 2020-02-09 | Disposition: A | Discharge status: Home / Self Care | Payer: HRSA Program | Source: Ambulatory Visit | Attending: Gastroenterology | Admitting: Gastroenterology

## 2020-02-09 DIAGNOSIS — Z20822 Contact with and (suspected) exposure to covid-19: Secondary | ICD-10-CM | POA: Diagnosis not present

## 2020-02-09 DIAGNOSIS — Z01812 Encounter for preprocedural laboratory examination: Secondary | ICD-10-CM | POA: Insufficient documentation

## 2020-02-09 LAB — SARS CORONAVIRUS 2 (TAT 6-24 HRS): SARS Coronavirus 2: NEGATIVE

## 2020-02-11 ENCOUNTER — Encounter: Payer: Self-pay | Admitting: Gastroenterology

## 2020-02-11 ENCOUNTER — Ambulatory Visit: Payer: Self-pay | Admitting: Anesthesiology

## 2020-02-11 ENCOUNTER — Other Ambulatory Visit: Payer: Self-pay

## 2020-02-11 ENCOUNTER — Encounter
Admission: RE | Disposition: A | Discharge status: Home / Self Care | Payer: Self-pay | Source: Home / Self Care | Attending: Gastroenterology

## 2020-02-11 ENCOUNTER — Ambulatory Visit
Admission: RE | Admit: 2020-02-11 | Discharge: 2020-02-11 | Disposition: A | Discharge status: Home / Self Care | Payer: Self-pay | Attending: Gastroenterology | Admitting: Gastroenterology

## 2020-02-11 DIAGNOSIS — Z1211 Encounter for screening for malignant neoplasm of colon: Secondary | ICD-10-CM | POA: Insufficient documentation

## 2020-02-11 DIAGNOSIS — Z7984 Long term (current) use of oral hypoglycemic drugs: Secondary | ICD-10-CM | POA: Insufficient documentation

## 2020-02-11 DIAGNOSIS — E785 Hyperlipidemia, unspecified: Secondary | ICD-10-CM | POA: Insufficient documentation

## 2020-02-11 DIAGNOSIS — Z79899 Other long term (current) drug therapy: Secondary | ICD-10-CM | POA: Insufficient documentation

## 2020-02-11 DIAGNOSIS — D122 Benign neoplasm of ascending colon: Secondary | ICD-10-CM | POA: Insufficient documentation

## 2020-02-11 DIAGNOSIS — I10 Essential (primary) hypertension: Secondary | ICD-10-CM | POA: Insufficient documentation

## 2020-02-11 DIAGNOSIS — Z86718 Personal history of other venous thrombosis and embolism: Secondary | ICD-10-CM | POA: Insufficient documentation

## 2020-02-11 DIAGNOSIS — K5730 Diverticulosis of large intestine without perforation or abscess without bleeding: Secondary | ICD-10-CM | POA: Insufficient documentation

## 2020-02-11 DIAGNOSIS — D126 Benign neoplasm of colon, unspecified: Secondary | ICD-10-CM

## 2020-02-11 DIAGNOSIS — D123 Benign neoplasm of transverse colon: Secondary | ICD-10-CM | POA: Insufficient documentation

## 2020-02-11 DIAGNOSIS — Z8673 Personal history of transient ischemic attack (TIA), and cerebral infarction without residual deficits: Secondary | ICD-10-CM | POA: Insufficient documentation

## 2020-02-11 DIAGNOSIS — Z87891 Personal history of nicotine dependence: Secondary | ICD-10-CM | POA: Insufficient documentation

## 2020-02-11 DIAGNOSIS — Z8371 Family history of colonic polyps: Secondary | ICD-10-CM | POA: Insufficient documentation

## 2020-02-11 DIAGNOSIS — E119 Type 2 diabetes mellitus without complications: Secondary | ICD-10-CM | POA: Insufficient documentation

## 2020-02-11 DIAGNOSIS — Z7982 Long term (current) use of aspirin: Secondary | ICD-10-CM | POA: Insufficient documentation

## 2020-02-11 HISTORY — PX: COLONOSCOPY WITH PROPOFOL: SHX5780

## 2020-02-11 HISTORY — DX: Type 2 diabetes mellitus without complications: E11.9

## 2020-02-11 LAB — GLUCOSE, CAPILLARY: Glucose-Capillary: 112 mg/dL — ABNORMAL HIGH (ref 70–99)

## 2020-02-11 SURGERY — COLONOSCOPY WITH PROPOFOL
Anesthesia: General

## 2020-02-11 MED ORDER — PROPOFOL 10 MG/ML IV BOLUS
INTRAVENOUS | Status: DC | PRN
  Administered 2020-02-11: 50 mg via INTRAVENOUS
  Administered 2020-02-11: 125 ug/kg/min via INTRAVENOUS

## 2020-02-11 MED ORDER — PROPOFOL 500 MG/50ML IV EMUL
INTRAVENOUS | Status: AC
  Filled 2020-02-11: qty 50

## 2020-02-11 MED ORDER — SODIUM CHLORIDE 0.9 % IV SOLN: INTRAVENOUS | Status: DC

## 2020-02-11 NOTE — H&P (Signed)
Jonathon Bellows, MD [address], [place], [place], [place], [address] [id number], [place], [place], [place], [address] Phone: [phone]  Fax: [phone]  Primary Care Physician:  Langston Reusing, NP   Pre-Procedure History & Physical: HPI:  Natalie Frey is a 64 y.o. female is here for an colonoscopy.   Past Medical History:  Diagnosis Date  . Cancer ([place])   . Diabetes mellitus without complication ([place])    type 2  . Dvt femoral (deep venous thrombosis) ([place])   . Hyperlipidemia     Past Surgical History:  Procedure Laterality Date  . BLADDER SURGERY    . DILATION AND EVACUATION      Prior to Admission medications   Medication Sig Start Date End Date Taking? Authorizing Provider  acetaminophen (TYLENOL) 325 MG tablet Take 2 tablets (650 mg total) by mouth every 6 (six) hours as needed for mild pain (or Fever >/= 101). 12/24/17  Yes Gouru, Aruna, MD  aspirin EC 81 MG tablet Take 81 mg by mouth daily.   Yes [provider]  ATORVASTATIN CALCIUM PO Take 1 tablet by mouth daily.   Yes [provider]  metFORMIN (GLUCOPHAGE) 500 MG tablet  01/04/20  Yes [provider]  atorvastatin (LIPITOR) 80 MG tablet Take 1 tablet (80 mg total) by mouth daily at 6 PM. 12/08/19 02/03/20  Iloabachie, Chioma E, NP  mirtazapine (REMERON) 15 MG tablet Take 1.5 tablets (22.5 mg total) by mouth at bedtime. 12/22/19 02/03/20  Iloabachie, Chioma E, NP  nicotine (NICODERM CQ - DOSED IN MG/24 HOURS) 21 mg/24hr patch Place 1 patch (21 mg total) onto the skin daily. Patient not taking: Reported on 01/20/2020 06/03/19   Caryl Asp E, NP    Allergies as of 01/20/2020  . (No Known Allergies)    Family History  Problem Relation Age of Onset  . Deep vein thrombosis Daughter         when pregnant   . Pulmonary embolism Sister        while in hospital; other sister- on BCPs.     Social History   Socioeconomic History  . Marital status: Widowed    Spouse name: Not  on file  . Number of children: Not on file  . Years of education: Not on file  . Highest education level: Not on file  Occupational History  . Not on file  Tobacco Use  . Smoking status: Former Smoker    Packs/day: 1.25    Years: 47.00    Pack years: 58.75    Types: Cigarettes  . Smokeless tobacco: Never Used  Substance and Sexual Activity  . Alcohol use: No  . Drug use: Yes    Types: Marijuana    Comment: last 1 month ago  . Sexual activity: Not on file  Other Topics Concern  . Not on file  Social History Narrative   [place]; smoking; 1ppd/cutting down. Social. Worked for [employer] from [place].       Said she has been doing ok and does not need any assistance          - is currently on food stamps but those have been enough       NO consent form signed    Social Determinants of Health   Financial Resource Strain: Low Risk   . Difficulty of Paying Living Expenses: Not very hard  Food Insecurity: No Food Insecurity  . Worried About Charity fundraiser in  the Last Year: Never true  . Ran Out of Food in the Last Year: Never true  Transportation Needs: No Transportation Needs  . Lack of Transportation (Medical): No  . Lack of Transportation (Non-Medical): No  Physical Activity: Sufficiently Active  . Days of Exercise per Week: 5 days  . Minutes of Exercise per Session: 30 min  Stress: No Stress Concern Present  . Feeling of Stress : Only a little  Social Connections: Moderately Isolated  . Frequency of Communication with Friends and Family: More than three times a week  . Frequency of Social Gatherings with Friends and Family: Once a week  . Attends Religious Services: Never  . Active Member of Clubs or Organizations: No  . Attends Archivist Meetings: Never  . Marital Status: Widowed  Intimate Partner Violence: Not At Risk  . Fear of Current or Ex-Partner: No  . Emotionally Abused: No  . Physically Abused: No  . Sexually Abused: No    Review  of Systems: See HPI, otherwise negative ROS  Physical Exam: BP 135/69   Pulse 73   Temp 98 F (36.7 C) (Temporal)   Resp 18   Ht 5\' 4"  (1.626 m)   Wt 77.1 kg   SpO2 97%   BMI 29.18 kg/m  General:   Alert,  pleasant and cooperative in NAD Head:  Normocephalic and atraumatic. Neck:  Supple; no masses or thyromegaly. Lungs:  Clear throughout to auscultation, normal respiratory effort.    Heart:  +S1, +S2, Regular rate and rhythm, No edema. Abdomen:  Soft, nontender and nondistended. Normal bowel sounds, without guarding, and without rebound.   Neurologic:  Alert and  oriented x4;  grossly normal neurologically.  Impression/Plan: Natalie Frey is here for an colonoscopy to be performed for Screening colonoscopy sister had colon polyps Risks, benefits, limitations, and alternatives regarding  colonoscopy have been reviewed with the patient.  Questions have been answered.  All parties agreeable.   Jonathon Bellows, MD  02/11/2020, 8:08 AM

## 2020-02-11 NOTE — Anesthesia Preprocedure Evaluation (Signed)
Anesthesia Evaluation  Patient identified by MRN, date of birth, ID band Patient awake    Reviewed: Allergy & Precautions, H&P , NPO status , Patient's Chart, lab work & pertinent test results, reviewed documented beta blocker date and time   History of Anesthesia Complications Negative for: history of anesthetic complications  Airway Mallampati: II  TM Distance: >3 FB Neck ROM: full    Dental  (+) Missing, Dental Advidsory Given, Poor Dentition   Pulmonary neg pulmonary ROS, former smoker,    Pulmonary exam normal breath sounds clear to auscultation       Cardiovascular Exercise Tolerance: Good hypertension, (-) angina(-) Past MI and (-) Cardiac Stents Normal cardiovascular exam(-) dysrhythmias (-) Valvular Problems/Murmurs Rhythm:regular Rate:Normal     Neuro/Psych neg Seizures PSYCHIATRIC DISORDERS Anxiety CVA, Residual Symptoms    GI/Hepatic negative GI ROS, Neg liver ROS,   Endo/Other  diabetes  Renal/GU negative Renal ROS  negative genitourinary   Musculoskeletal   Abdominal   Peds  Hematology negative hematology ROS (+)   Anesthesia Other Findings Past Medical History: No date: Cancer ([place]) No date: Diabetes mellitus without complication ([facility])     Comment:  type 2 No date: Dvt femoral (deep venous thrombosis) ([facility]) No date: Hyperlipidemia   Reproductive/Obstetrics negative OB ROS                             Anesthesia Physical Anesthesia Plan  ASA: II  Anesthesia Plan: General   Post-op Pain Management:    Induction: Intravenous  PONV Risk Score and Plan: 3 and Propofol infusion and TIVA  Airway Management Planned: Natural Airway and Nasal Cannula  Additional Equipment:   Intra-op Plan:   Post-operative Plan:   Informed Consent: I have reviewed the patients History and Physical, chart, labs and discussed the procedure including the risks, benefits and alternatives  for the proposed anesthesia with the patient or authorized representative who has indicated his/her understanding and acceptance.     Dental Advisory Given  Plan Discussed with: Anesthesiologist, CRNA and Surgeon  Anesthesia Plan Comments:         Anesthesia Quick Evaluation

## 2020-02-11 NOTE — Op Note (Addendum)
[facility] Gastroenterology Patient Name: Natalie Frey Procedure Date: 02/11/2020 7:34 AM MRN: [id number] Account #: [id number] Date of Birth: [birth year] Admit Type: Outpatient Age: 64 Room: [facility] ENDO ROOM 3 Gender: Female Note Status: Finalized Procedure:             Colonoscopy Indications:           Colon cancer screening in patient at increased risk:                         Family history of 1st-degree relative with colon polyps Providers:             Jonathon Bellows MD, MD Referring MD:          - -. [facility], MD (Referring MD) Medicines:             Monitored Anesthesia Care Complications:         No immediate complications. Procedure:             Pre-Anesthesia Assessment:                        - Prior to the procedure, a History and Physical was                         performed, and patient medications, allergies and                         sensitivities were reviewed. The patient's tolerance                         of previous anesthesia was reviewed.                        - The risks and benefits of the procedure and the                         sedation options and risks were discussed with the                         patient. All questions were answered and informed                         consent was obtained.                        - ASA Grade Assessment: II - A patient with mild                         systemic disease.                        After obtaining informed consent, the colonoscope was                         passed under direct vision. Throughout the procedure,                         the patient's blood pressure, pulse, and oxygen  saturations were monitored continuously. The                         Colonoscope was introduced through the anus and                         advanced to the the cecum, identified by the                         appendiceal orifice. The colonoscopy was performed   with ease. The patient tolerated the procedure well.                         The quality of the bowel preparation was excellent. Findings:      The perianal and digital rectal examinations were normal.      Multiple small-mouthed diverticula were found in the sigmoid colon.      Two sessile polyps were found in the transverse colon and ascending       colon. The polyps were 3 to 4 mm in size. These polyps were removed with       a jumbo cold forceps. Resection and retrieval were complete.      The exam was otherwise without abnormality on direct and retroflexion       views. Impression:            - Diverticulosis in the sigmoid colon.                        - Two 3 to 4 mm polyps in the transverse colon and in                         the ascending colon, removed with a jumbo cold                         forceps. Resected and retrieved.                        - The examination was otherwise normal on direct and                         retroflexion views. Recommendation:        - Discharge patient to home (with escort).                        - Resume previous diet.                        - Continue present medications.                        - Await pathology results.                        - Repeat colonoscopy for surveillance based on                         pathology results. Procedure Code(s):     --- Professional ---                        [phone], Colonoscopy,  flexible; with biopsy, single or                         multiple Diagnosis Code(s):     --- Professional ---                        Z83.71, Family history of colonic polyps                        K63.5, Polyp of colon                        K57.30, Diverticulosis of large intestine without                         perforation or abscess without bleeding CPT copyright 2019 American Medical Association. All rights reserved. The codes documented in this report are preliminary and upon coder review may  be revised to meet  current compliance requirements. Jonathon Bellows, MD Jonathon Bellows MD, MD 02/11/2020 8:41:05 AM This report has been signed electronically. Number of Addenda: 0 Note Initiated On: 02/11/2020 7:34 AM Scope Withdrawal Time: 0 hours 9 minutes 31 seconds  Total Procedure Duration: 0 hours 13 minutes 10 seconds  Estimated Blood Loss:  Estimated blood loss: none.      [facility]

## 2020-02-11 NOTE — Anesthesia Postprocedure Evaluation (Signed)
Anesthesia Post Note  Patient: Natalie Frey  Procedure(s) Performed: COLONOSCOPY WITH PROPOFOL (N/A )  Patient location during evaluation: Endoscopy Anesthesia Type: General Level of consciousness: awake and alert Pain management: pain level controlled Vital Signs Assessment: post-procedure vital signs reviewed and stable Respiratory status: spontaneous breathing, nonlabored ventilation, respiratory function stable and patient connected to nasal cannula oxygen Cardiovascular status: blood pressure returned to baseline and stable Postop Assessment: no apparent nausea or vomiting Anesthetic complications: no     Last Vitals:  Vitals:   02/11/20 0900 02/11/20 0910  BP: 126/76 118/84  Pulse: 67 72  Resp: 13 17  Temp:    SpO2: 98% 100%    Last Pain:  Vitals:   02/11/20 0840  TempSrc: Temporal  PainSc:                  Martha Clan

## 2020-02-11 NOTE — Transfer of Care (Signed)
Immediate Anesthesia Transfer of Care Note  Patient: Natalie Frey  Procedure(s) Performed: COLONOSCOPY WITH PROPOFOL (N/A )  Patient Location: PACU and Endoscopy Unit  Anesthesia Type:General  Level of Consciousness: awake, alert  and oriented  Airway & Oxygen Therapy: Patient Spontanous Breathing  Post-op Assessment: Report given to RN  Post vital signs: Reviewed and stable  Last Vitals:  Vitals Value Taken Time  BP 93/58 02/11/20 0843  Temp    Pulse 65 02/11/20 0843  Resp 17 02/11/20 0843  SpO2 98 % 02/11/20 0843  Vitals shown include unvalidated device data.  Last Pain:  Vitals:   02/11/20 0801  TempSrc: Temporal  PainSc: 0-No pain         Complications: No apparent anesthesia complications

## 2020-02-12 ENCOUNTER — Encounter: Payer: Self-pay | Admitting: *Deleted

## 2020-02-12 LAB — SURGICAL PATHOLOGY

## 2020-02-15 ENCOUNTER — Encounter: Payer: Self-pay | Admitting: Gastroenterology

## 2020-02-17 ENCOUNTER — Ambulatory Visit
Admission: RE | Admit: 2020-02-17 | Discharge: 2020-02-17 | Disposition: A | Discharge status: Home / Self Care | Payer: Self-pay | Source: Ambulatory Visit | Attending: Oncology | Admitting: Oncology

## 2020-02-17 ENCOUNTER — Ambulatory Visit: Payer: Self-pay | Attending: Oncology

## 2020-02-17 ENCOUNTER — Other Ambulatory Visit: Payer: Self-pay

## 2020-02-17 VITALS — BP 132/62 | HR 80 | Temp 98.9°F | Ht 64.0 in | Wt 171.0 lb

## 2020-02-17 DIAGNOSIS — Z0000 Encounter for general adult medical examination without abnormal findings: Secondary | ICD-10-CM

## 2020-02-17 NOTE — Progress Notes (Signed)
  Subjective:     Patient ID: Natalie Frey, female   DOB: 1955-11-19, 64 y.o.   MRN: [id number]  HPI   Review of Systems     Objective:   Physical Exam Chest:     Breasts:        Right: No swelling, bleeding, inverted nipple, mass, nipple discharge, skin change or tenderness.        Left: No swelling, bleeding, inverted nipple, mass, nipple discharge, skin change or tenderness.  Genitourinary:    Labia:        Right: No rash, tenderness, lesion or injury.        Left: No rash, tenderness, lesion or injury.      Vagina: No signs of injury and foreign body. Prolapsed vaginal walls present. No vaginal discharge, erythema, tenderness, bleeding or lesions.     Cervix: No cervical motion tenderness, discharge, friability, lesion, erythema, cervical bleeding or eversion.     Uterus: With uterine prolapse. Not deviated, not enlarged and not fixed.      Adnexa:        Right: No mass, tenderness or fullness.         Left: No mass, tenderness or fullness.       Comments: Pelvic prolapse        Assessment:     64 year old patient presents for BCCCP clinic visitPatient screened, and meets BCCCP eligibility.  Patient does not have insurance, Medicare or Medicaid. Instructed patient on breast self awareness using teach back method. Clinical breast exam unremarkable. No mass or lump palpated. Pelvic prolapse noted on exam.  Patient reports she had a stroke in 2020, but is doing much better, with some difficulty in walking. Sent for bilateral screening mammogram. Specimen collected for pap.    Marland Kitchen

## 2020-02-18 ENCOUNTER — Ambulatory Visit: Payer: Self-pay | Admitting: Licensed Clinical Social Worker

## 2020-02-22 LAB — IGP, APTIMA HPV: HPV Aptima: NEGATIVE

## 2020-02-23 ENCOUNTER — Other Ambulatory Visit: Payer: Self-pay | Admitting: Gerontology

## 2020-02-23 DIAGNOSIS — N814 Uterovaginal prolapse, unspecified: Secondary | ICD-10-CM

## 2020-03-07 ENCOUNTER — Other Ambulatory Visit: Payer: Self-pay | Admitting: Gerontology

## 2020-03-09 NOTE — Progress Notes (Signed)
Phoned patient with [facility] 1 mammogram results, and negative/negative pap results.  She is tor return for annual mammogram screening next year.  Next pap due in 5 years.

## 2020-03-17 ENCOUNTER — Other Ambulatory Visit: Payer: Self-pay

## 2020-03-17 ENCOUNTER — Ambulatory Visit: Payer: Self-pay | Admitting: Licensed Clinical Social Worker

## 2020-03-17 DIAGNOSIS — F418 Other specified anxiety disorders: Secondary | ICD-10-CM

## 2020-03-17 DIAGNOSIS — F4323 Adjustment disorder with mixed anxiety and depressed mood: Secondary | ICD-10-CM

## 2020-03-17 MED ORDER — MIRTAZAPINE 15 MG PO TABS: 22.5000 mg | ORAL_TABLET | Freq: Every day | ORAL | 2 refills | Status: DC

## 2020-03-17 NOTE — BH Specialist Note (Signed)
Integrated Behavioral Health Follow Up Visit Via Phone  MRN: [id number] Name: Natalie Frey   Type of Service: [place] Interpretor:No. Interpretor Name and Language: not applicable.   SUBJECTIVE: Natalie Frey is a 64 y.o. female accompanied by herself. Patient was referred by Carlyon Shadow NP for mental health. Patient reports the following symptoms/concerns: She notes that she has has things that are stressing out but she is learning to deal with them. She explains that normal life is going well and is learning how to do the self check out at [employer]. She notes that she talks to her sisters everyday on the phone. She reports that she is working forty hours a week. She discussed situational stressors with [facility] and a worker accidentally shredding her paperwork. She notes that she is organized and has to put things in the perfect spot. She notes that she is sleeping very well. She notes that she is still sticking to eating healthy. She notes that she is still debating whether or not to move to [place]. She denies suicidal and homicidal thoughts.  Duration of problem: ; Severity of problem: mild  OBJECTIVE: Mood: Euthymic and Affect: Appropriate Risk of harm to self or others: No plan to harm self or others  LIFE CONTEXT: Family and Social: see above.  School/Work: see above. Self-Care: see above. Life Changes: see above.  GOALS ADDRESSED: Patient will: 1.  Reduce symptoms of: stress  2.  Increase knowledge and/or ability of: healthy habits and self-management skills  3.  Demonstrate ability to: Increase healthy adjustment to current life circumstances  INTERVENTIONS: Interventions utilized:  Supportive Counseling was utilized by the clinician during today's session. Clinician processed with the patient regarding how she has been doing since the last follow up session. Clinician measured the patient's anxiety and depression on a  numerical scale. Clinician utilized reflective listening encouraging the patient to ventilate her feelings towards her current situation. Clinician suggested that the patient make additional copies of the things that she needs to turn in to [facility] in case the financial person misplaces her documents again.  Standardized Assessments completed: GAD-7 and PHQ 9  ASSESSMENT: Patient currently experiencing see above.   Patient may benefit from see above.  PLAN: 1. Follow up with behavioral health clinician on :  2. Behavioral recommendations:  3. Referral(s): [place] (In Clinic) 4. "From scale of 1-10, how likely are you to follow plan?":   Bayard Hugger, LCSW

## 2020-04-14 ENCOUNTER — Ambulatory Visit: Payer: Self-pay | Admitting: Licensed Clinical Social Worker

## 2020-04-14 ENCOUNTER — Other Ambulatory Visit: Payer: Self-pay

## 2020-04-14 DIAGNOSIS — F4323 Adjustment disorder with mixed anxiety and depressed mood: Secondary | ICD-10-CM

## 2020-04-14 NOTE — BH Specialist Note (Signed)
Integrated Behavioral Health Follow Up Visit Via Phone  MRN: [id number] Name: Natalie Frey  Type of Service: [place] Interpretor:No. Interpretor Name and Language: not applicable.   SUBJECTIVE: Natalie Frey is a 64 y.o. female accompanied by herself. Patient was referred by Carlyon Shadow NP for mental health. Patient reports the following symptoms/concerns: She notes that she had a crazy month due to one of her daughters who lives in [place] boyfriend suffering from a brian bleed. She reports that it started out a rough week but its getting better. She notes that she was not sleeping well at first when she got the news about her daughter's boyfriend but he is improving everyday and will be okay. She notes its been a lot to handle broke down and cried. She notes that she has been talking to her ex recently, helped her fix some things, and hung out a few times. She notes that things have been slow and quiet at work. She denies suicidal and homicidal thoughts.  Duration of problem: ; Severity of problem: mild  OBJECTIVE: Mood: Euthymic and Affect: Appropriate Risk of harm to self or others: No plan to harm self or others  LIFE CONTEXT: Family and Social: see above.  School/Work: see above.  Self-Care: see above. Life Changes: see above.   GOALS ADDRESSED: Patient will: 1.  Reduce symptoms of: stress  2.  Increase knowledge and/or ability of: self-management skills and stress reduction  3.  Demonstrate ability to: Increase healthy adjustment to current life circumstances  INTERVENTIONS: Interventions utilized:  Supportive Counseling was utilized by the clinician during today's follow up session. Clinician processed with the patient regarding how she has been doing since the last follow up session. Clinician measured the patient's anxiety and depression on a numerical scale. Clinician explained to the patient that it sounds like she had a emotional  week with her daughter's boyfriend having a near death experience. Clinician encouraged the patient to focus on the positives rather than the negatives. Clinician informed the patient that she has put in her two weeks notice and her last day is August 19th. 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  Standardized Assessments completed: GAD-7 and PHQ 9 PHQ-9:6 GAD 7:6 ASSESSMENT: Patient currently experiencing see above.  Patient may benefit from see above.  PLAN: 1. Follow up with behavioral health clinician on :  2. Behavioral recommendations:  3. Referral(s): [place] (In Clinic) 4. "From scale of 1-10, how likely are you to follow plan?":   Bayard Hugger, LCSW

## 2020-05-02 ENCOUNTER — Other Ambulatory Visit: Payer: Self-pay | Admitting: Gerontology

## 2020-05-03 ENCOUNTER — Telehealth: Payer: Self-pay | Admitting: *Deleted

## 2020-05-03 NOTE — Telephone Encounter (Signed)
Pt has been notified that lung cancer screening CT scan is due currently or will be in near future. Confirmed pt is within appropriate age range, and asymptomatic. Pt denies illness that would prevent curative treatment for lung cancer if found, but noted that she had a stroke about a year ago. Verified smoking history (Former Smoker since 2020, 1.5 ppd). Pt received 2nd COVID VX in April 2021. Pt is agreeable for CT scan being scheduled.

## 2020-05-05 NOTE — Telephone Encounter (Signed)
Attempted to call with appt., no answer or voicemail option available.

## 2020-05-06 NOTE — Telephone Encounter (Signed)
Left voicemail in attempt to give lung screening appt.

## 2020-05-09 ENCOUNTER — Encounter: Payer: Self-pay | Admitting: *Deleted

## 2020-05-09 NOTE — Telephone Encounter (Signed)
Left voicemail in attempt to schedule lung screening scan. Will also send Mychart message.

## 2020-05-11 ENCOUNTER — Telehealth: Payer: Self-pay | Admitting: *Deleted

## 2020-05-11 NOTE — Telephone Encounter (Signed)
(  05/11/2020) Pt notified that lung cancer screening imaging is due currently or in the near future. Verified smoking history (Former Smoker since 2020, 1.25 ppd). Tentative appt on 06/30/20 @ 8:00 am SRW

## 2020-05-12 ENCOUNTER — Other Ambulatory Visit: Payer: Self-pay

## 2020-05-12 ENCOUNTER — Ambulatory Visit: Payer: Self-pay | Admitting: Licensed Clinical Social Worker

## 2020-05-12 DIAGNOSIS — F4323 Adjustment disorder with mixed anxiety and depressed mood: Secondary | ICD-10-CM

## 2020-05-12 NOTE — BH Specialist Note (Signed)
Integrated Behavioral Health Follow Up Visit Via Phone   MRN: [id number] Name: Calyssa Zobrist  Type of Service: [place] Interpretor:No. Interpretor Name and Language: not applicable.   SUBJECTIVE: Shanyia Stines is a 64 y.o. female accompanied by herself. Patient was referred by Carlyon Shadow NP for mental health. Patient reports the following symptoms/concerns: She discussed situational stressors with her family. She notes that other than that she has been doing well. She notes that work has been busy which has been good. She notes that she has had some anxiety about one of her daughters. She denies suicidal and homicidal thoughts.  Duration of problem: ; Severity of problem: mild  OBJECTIVE: Mood: Euthymic and Affect: Appropriate Risk of harm to self or others: No plan to harm self or others  LIFE CONTEXT: Family and Social: see above.  School/Work: see above. Self-Care: see above. Life Changes: see above.  GOALS ADDRESSED: Patient will: 1.  Reduce symptoms of: anxiety  2.  Increase knowledge and/or ability of: self-management skills  3.  Demonstrate ability to: Increase healthy adjustment to current life circumstances  INTERVENTIONS: Interventions utilized:  Supportive Counseling was utilized by the clinician during today's follow up session. Clinician processed with the patient regarding how she has been doing since the last follow up session. Clinician measured the patient's anxiety and depression on a numerical scale. Clinician utilized reflective listening encouraging the patient to ventilate her feelings towards her current situation. Clinician explained that it sounds like she has had a few health scares with various family members in the last two months. Clinician explained to the patient that despite her stress that she has been able to manage and has had any freak out moments.  Standardized Assessments completed: GAD-7 and PHQ 9 GAD 7  6 PHQ 9 6 ASSESSMENT: Patient currently experiencing see above.  Patient may benefit from see above.  PLAN: 1. Follow up with behavioral health clinician on :  2. Behavioral recommendations: see above.  3. Referral(s): [facility] (In Clinic) 4. "From scale of 1-10, how likely are you to follow plan?":   Bayard Hugger, LCSW

## 2020-05-13 ENCOUNTER — Other Ambulatory Visit: Payer: Self-pay | Admitting: *Deleted

## 2020-05-13 DIAGNOSIS — Z87891 Personal history of nicotine dependence: Secondary | ICD-10-CM

## 2020-05-13 DIAGNOSIS — Z122 Encounter for screening for malignant neoplasm of respiratory organs: Secondary | ICD-10-CM

## 2020-06-02 ENCOUNTER — Other Ambulatory Visit: Payer: Self-pay

## 2020-06-02 DIAGNOSIS — R7303 Prediabetes: Secondary | ICD-10-CM

## 2020-06-03 LAB — COMPREHENSIVE METABOLIC PANEL
ALT: 18 IU/L (ref 0–32)
AST: 17 IU/L (ref 0–40)
Albumin/Globulin Ratio: 1.6 (ref 1.2–2.2)
Albumin: 4.3 g/dL (ref 3.8–4.8)
Alkaline Phosphatase: 72 IU/L (ref 48–121)
BUN/Creatinine Ratio: 16 (ref 12–28)
BUN: 12 mg/dL (ref 8–27)
Bilirubin Total: 0.8 mg/dL (ref 0.0–1.2)
CO2: 22 mmol/L (ref 20–29)
Calcium: 9.1 mg/dL (ref 8.7–10.3)
Chloride: 105 mmol/L (ref 96–106)
Creatinine, Ser: 0.75 mg/dL (ref 0.57–1.00)
GFR calc Af Amer: 99 mL/min/{1.73_m2} (ref >59)
GFR calc non Af Amer: 86 mL/min/{1.73_m2} (ref >59)
Globulin, Total: 2.7 g/dL (ref 1.5–4.5)
Glucose: 124 mg/dL — ABNORMAL HIGH (ref 65–99)
Potassium: 3.9 mmol/L (ref 3.5–5.2)
Sodium: 141 mmol/L (ref 134–144)
Total Protein: 7 g/dL (ref 6.0–8.5)

## 2020-06-03 LAB — HEMOGLOBIN A1C
Est. average glucose Bld gHb Est-mCnc: 131 mg/dL
Hgb A1c MFr Bld: 6.2 % — ABNORMAL HIGH (ref 4.8–5.6)

## 2020-06-08 ENCOUNTER — Ambulatory Visit: Payer: Self-pay | Admitting: Licensed Clinical Social Worker

## 2020-06-08 ENCOUNTER — Other Ambulatory Visit: Payer: Self-pay

## 2020-06-08 DIAGNOSIS — F4323 Adjustment disorder with mixed anxiety and depressed mood: Secondary | ICD-10-CM

## 2020-06-08 NOTE — BH Specialist Note (Signed)
Integrated Behavioral Health Follow Up Visit  MRN: [id number] Name: Natalie Frey   Type of Service: [place] Interpretor:No. Interpretor Name and Language: none  SUBJECTIVE: Natalie Frey is a 64 y.o. female accompanied by self  Patient was referred by Carlyon Shadow, NP for mental health Patient reports the following symptoms/concerns: The patient reported having a bad day at work because she was the only Scientist, water quality and felt agitated. She reports that her son in law is improving however, he had a stroke in rehab. Patient is planning to visit her family in [place] in two weeks. She states she is excited to spend time with her daughter, son-in-law and grandchildren. She is looking forward to going to a concert while she is there. Patient asked the clinician to let her previous therapist know she is going to take that trip they talked about. Patient reports that she felt accomplished because she fixed her own flat tire this week. She states that everything other than work is going well in her life, and that her life has been fairly calm the last three weeks weeks. Patient denies any suicidal or homicidal thoughts.  Duration of problem:; Severity of problem: moderate  OBJECTIVE: Mood: Euthymic and Affect: Appropriate Risk of harm to self or others: No plan to harm self or others  LIFE CONTEXT: Family and Social: see above School/Work: see above Self-Care: see above Life Changes: see above   GOALS ADDRESSED: Patient will: 1.  Reduce symptoms of: anxiety, depression and stress  2.  Increase knowledge and/or ability of: coping skills and stress reduction  3.  Demonstrate ability to: Increase healthy adjustment to current life circumstances  INTERVENTIONS: Interventions utilized:  Supportive Counseling was utilized by clinician during today's session. Clinician processed with patient how they have been doing during the last three weeks since their last  session with Julian Hy, LCSW. Clinician utilized reflective listening encouraging the patient to ventilate her feelings toward her current circumstances. Clinician offered praise to the patient for taking the intitiative to fix her flat tire by herself. Clinician processed with the patient her feelings regarding her therapist leaving and beginning to work with someone new. Clinician measured the patient's depression and anxiety on a numerical scale.   Standardized Assessments completed: GAD-7 and PHQ 9  Gad-7  7   PHQ-9  6  ASSESSMENT: Patient currently experiencing see above    Patient may benefit from see above.  PLAN: 1. Follow up with behavioral health clinician on : 06/29/2020 at 4:00 am 2. Behavioral recommendations:  3. Referral(s): [facility] (In Clinic) 4. "From scale of 1-10, how likely are you to follow plan?":  Lesli Albee, Student-Social Work

## 2020-06-09 ENCOUNTER — Other Ambulatory Visit: Payer: Self-pay

## 2020-06-09 ENCOUNTER — Other Ambulatory Visit: Payer: Self-pay | Admitting: Gerontology

## 2020-06-09 ENCOUNTER — Encounter: Payer: Self-pay | Admitting: Gerontology

## 2020-06-09 ENCOUNTER — Ambulatory Visit: Payer: Self-pay | Admitting: Gerontology

## 2020-06-09 VITALS — BP 134/80 | HR 81 | Temp 97.6°F | Resp 16 | Ht 64.0 in | Wt 164.1 lb

## 2020-06-09 DIAGNOSIS — F17200 Nicotine dependence, unspecified, uncomplicated: Secondary | ICD-10-CM

## 2020-06-09 DIAGNOSIS — E782 Mixed hyperlipidemia: Secondary | ICD-10-CM

## 2020-06-09 DIAGNOSIS — R7303 Prediabetes: Secondary | ICD-10-CM

## 2020-06-09 MED ORDER — METFORMIN HCL 500 MG PO TABS: ORAL_TABLET | ORAL | 4 refills | Status: DC

## 2020-06-09 MED ORDER — ATORVASTATIN CALCIUM 80 MG PO TABS: 80.0000 mg | ORAL_TABLET | Freq: Every day | ORAL | 4 refills | Status: DC

## 2020-06-09 MED ORDER — NICOTINE 21 MG/24HR TD PT24: 21.0000 mg | MEDICATED_PATCH | Freq: Every day | TRANSDERMAL | 1 refills | Status: DC

## 2020-06-09 NOTE — Patient Instructions (Signed)
Prediabetes Eating Plan Prediabetes is a condition that causes blood sugar (glucose) levels to be higher than normal. This increases the risk for developing diabetes. In order to prevent diabetes from developing, your health care provider may recommend a diet and other lifestyle changes to help you:  Control your blood glucose levels.  Improve your cholesterol levels.  Manage your blood pressure. Your health care provider may recommend working with a diet and nutrition specialist (dietitian) to make a meal plan that is best for you. What are tips for following this plan? Lifestyle  Set weight loss goals with the help of your health care team. It is recommended that most people with prediabetes lose 7% of their current body weight.  Exercise for at least 30 minutes at least 5 days a week.  Attend a support group or seek ongoing support from a mental health counselor.  Take over-the-counter and prescription medicines only as told by your health care provider. Reading food labels  Read food labels to check the amount of fat, salt (sodium), and sugar in prepackaged foods. Avoid foods that have: ? Saturated fats. ? Trans fats. ? Added sugars.  Avoid foods that have more than 300 milligrams (mg) of sodium per serving. Limit your daily sodium intake to less than 2,300 mg each day. Shopping  Avoid buying pre-made and processed foods. Cooking  Cook with olive oil. Do not use butter, lard, or ghee.  Bake, broil, grill, or boil foods. Avoid frying. Meal planning   Work with your dietitian to develop an eating plan that is right for you. This may include: ? Tracking how many calories you take in. Use a food diary, notebook, or mobile application to track what you eat at each meal. ? Using the glycemic index (GI) to plan your meals. The index tells you how quickly a food will raise your blood glucose. Choose low-GI foods. These foods take a longer time to raise blood glucose.  Consider  following a Mediterranean diet. This diet includes: ? Several servings each day of fresh fruits and vegetables. ? Eating fish at least twice a week. ? Several servings each day of whole grains, beans, nuts, and seeds. ? Using olive oil instead of other fats. ? Moderate alcohol consumption. ? Eating small amounts of red meat and whole-fat dairy.  If you have high blood pressure, you may need to limit your sodium intake or follow a diet such as the DASH eating plan. DASH is an eating plan that aims to lower high blood pressure. What foods are recommended? The items listed below may not be a complete list. Talk with your dietitian about what dietary choices are best for you. Grains Whole grains, such as whole-wheat or whole-grain breads, crackers, cereals, and pasta. Unsweetened oatmeal. Bulgur. Barley. Quinoa. Brown rice. Corn or whole-wheat flour tortillas or taco shells. Vegetables Lettuce. Spinach. Peas. Beets. Cauliflower. Cabbage. Broccoli. Carrots. Tomatoes. Squash. Eggplant. Herbs. Peppers. Onions. Cucumbers. Brussels sprouts. Fruits Berries. Bananas. Apples. Oranges. Grapes. Papaya. Mango. Pomegranate. Kiwi. Grapefruit. Cherries. Meats and other protein foods Seafood. Poultry without skin. Lean cuts of pork and beef. Tofu. Eggs. Nuts. Beans. Dairy Low-fat or fat-free dairy products, such as yogurt, cottage cheese, and cheese. Beverages Water. Tea. Coffee. Sugar-free or diet soda. Seltzer water. Lowfat or no-fat milk. Milk alternatives, such as soy or almond milk. Fats and oils Olive oil. Canola oil. Sunflower oil. Grapeseed oil. Avocado. Walnuts. Sweets and desserts Sugar-free or low-fat pudding. Sugar-free or low-fat ice cream and other frozen treats.  Seasoning and other foods Herbs. Sodium-free spices. Mustard. Relish. Low-fat, low-sugar ketchup. Low-fat, low-sugar barbecue sauce. Low-fat or fat-free mayonnaise. What foods are not recommended? The items listed below may not be a  complete list. Talk with your dietitian about what dietary choices are best for you. Grains Refined white flour and flour products, such as bread, pasta, snack foods, and cereals. Vegetables Canned vegetables. Frozen vegetables with butter or cream sauce. Fruits Fruits canned with syrup. Meats and other protein foods Fatty cuts of meat. Poultry with skin. Breaded or fried meat. Processed meats. Dairy Full-fat yogurt, cheese, or milk. Beverages Sweetened drinks, such as sweet iced tea and soda. Fats and oils Butter. Lard. Ghee. Sweets and desserts Baked goods, such as cake, cupcakes, pastries, cookies, and cheesecake. Seasoning and other foods Spice mixes with added salt. Ketchup. Barbecue sauce. Mayonnaise. Summary  To prevent diabetes from developing, you may need to make diet and other lifestyle changes to help control blood sugar, improve cholesterol levels, and manage your blood pressure.  Set weight loss goals with the help of your health care team. It is recommended that most people with prediabetes lose 7 percent of their current body weight.  Consider following a Mediterranean diet that includes plenty of fresh fruits and vegetables, whole grains, beans, nuts, seeds, fish, lean meat, low-fat dairy, and healthy oils. This information is not intended to replace advice given to you by your health care provider. Make sure you discuss any questions you have with your health care provider. Document Revised: 01/09/2019 Document Reviewed: 11/21/2016 Elsevier Patient Education  [place].  Preventing Smoking Relapse After Pregnancy Protecting your unborn baby from the dangers of smoking can be reason enough to stop smoking while you are pregnant. However, it may be hard not to start smoking again (relapse) once your baby is born, especially with the stress of being a new parent. Certain people, places, and situations (called triggers) can also make you want to smoke. Knowing  your triggers and making some diet and lifestyle choices can help prevent a relapse. Talk with your health care provider if you are struggling to quit smoking or to remain smoke free. How can smoking affect me? If you smoke, you are at greater risk for serious health problems, including:  Heart disease.  Lung disease.  Many types of cancer. The benefits of not smoking include:  Having more energy.  Having an improved sense of taste and smell.  Having no smoke odor or residue (thirdhand smoke) on your clothes or in your home and car.  Saving money from not buying cigarettes. How can this behavior affect my baby? Smoking around a newborn baby (secondhand smoke) is harmful. If you smoke around your baby, your baby has a greater risk for:  Sudden infant death syndrome (SIDS).  Colic.  Difficulty breastfeeding.  Asthma and other lung problems.  Being overweight or obese as a child or adult.  Ear infections.  Behavior and learning problems.  Becoming a smoker. What actions can I take to prevent a smoking relapse? Nutrition Right after you quit smoking, you may crave certain foods. You may really want to eat sweets. You may also feel the urge to have something in your mouth when you want a cigarette. You can make healthy food choices so you do not overeat and gain weight. Some ways you can do this are:  Snack on crunchy fruits and vegetables.  Eat whole-grain foods, such as cereals and breads, to help you feel full.  Eat foods that are very flavorful.  Do not eat sugary foods.  If you crave sweets, frozen treats may help. Choose: ? Naturally sweet foods, such as frozen fruits or fruit bars. ? Sugar-free or fat-free ice cream or sherbet. ? Frozen grapes.  Keep sugar-free hard candies and gum on hand for times when you want to have something in your mouth. Getting too hungry can lead to low blood sugar. This can trigger a craving to smoke. Make sure you:  Eat breakfast  every day.  Eat regularly throughout the day.  Have nutritious, low-calorie snacks between meals, such as fruits, vegetables, nuts, and low-fat yogurt.  Do not skip lunch or dinner.  Lifestyle The following lifestyle changes can also help you to prevent a smoking relapse after pregnancy:  Get regular exercise. It may be easier to exercise for short periods of time throughout the day. Try taking your baby for a daily walk.  Avoid places where smoking is allowed.  Avoid the places where you used to smoke.  Avoid people you used to smoke with.  Take up a new hobby.  Find fun new things to enjoy with your baby.  Drink enough water to keep your urine pale yellow.  Get enough sleep. Rest when the baby is asleep, and ask for help taking care of the baby.  Practice relaxation techniques every day to reduce stress. These include deep-breathing exercises, yoga, and meditation.  Avoid alcohol and caffeine, which can trigger cravings to smoke.  Where to find support For more support, go to:  Your health care provider. Ask about support groups in your area.  Online support groups.  EX: www.becomeanex.org  https://hall.com/: smokefree.gov Where to find more information Learn more about preventing smoking relapse after pregnancy from:  https://hall.com/: women.smokefree.gov  Centers for Disease Control and Prevention: http://www.wolf.info/  American Cancer Society: www.cancer.org Contact a health care provider if:  You are struggling to quit smoking or to remain smoke free. Summary  The stress of being a new parent can sometimes make it hard not to relapse.  Knowing your triggers and making some diet and lifestyle choices can help prevent a relapse.  Have nutritious, low-calorie snacks between meals, such as fruits, vegetables, nuts, and low-fat yogurt.  Contact your health care provider if you need help to quit smoking or to remain smoke free. This information is not intended to replace  advice given to you by your health care provider. Make sure you discuss any questions you have with your health care provider. Document Revised: 06/10/2019 Document Reviewed: 11/04/2017 Elsevier Patient Education  [place].

## 2020-06-09 NOTE — Progress Notes (Signed)
Established Patient Office Visit  Subjective:  Patient ID: Natalie Frey, female    DOB: 25-Jul-1956  Age: 64 y.o. MRN: [id number]  CC:  Chief Complaint  Patient presents with   Follow-up    HPI Natalie Frey presents for follow up of Prediabetes and lab review. Her HgbA1c done on 06/02/2020 was the same at 6.2%. She states that she's compliant with her medications and continues to make healthy lifestyle changes. She checks her blood glucose weekly and it was 105 mg/dl last week. She denies hypoglycemia/hyperglycemic and peripheral neuropathy. She also requests nicotine patch because she started smoking 1 pack of cigarette in 3 days. She was seen at [facility] on 05/11/2020  By Laren Boom [facility] Uterine prolapse and was advised to continue removing her pessary once weekly and re inserting the next day. She was also prescribed estradiol 0.01% vaginal cream 2 times a week. Overall, she states that she's doing well and offers no further complaint.  Past Medical History:  Diagnosis Date   Cancer ([place])    bladder   Diabetes mellitus without complication ([place])    type 2   Dvt femoral (deep venous thrombosis) ([place])    Hyperlipidemia     Past Surgical History:  Procedure Laterality Date   BLADDER SURGERY     COLONOSCOPY WITH PROPOFOL N/A 02/11/2020   Procedure: COLONOSCOPY WITH PROPOFOL;  Surgeon: Jonathon Bellows, MD;  Location: [facility] ENDOSCOPY;  Service: Gastroenterology;  Laterality: N/A;   DILATION AND EVACUATION      Family History  Problem Relation Age of Onset   Deep vein thrombosis Daughter         when pregnant    Pulmonary embolism Sister        while in hospital; other sister- on BCPs.    Breast cancer Sister 27    Social History   Socioeconomic History   Marital status: Widowed    Spouse name: Not on file   Number of children: 2   Years of education: Not on file   Highest education level: High school graduate  Occupational History   Not on file  Tobacco  Use   Smoking status: Former Smoker    Packs/day: 0.25    Years: 47.00    Pack years: 11.75    Types: Cigarettes   Smokeless tobacco: Never Used   Tobacco comment: one cigarette every 3 days  Vaping Use   Vaping Use: Never used  Substance and Sexual Activity   Alcohol use: No   Drug use: Not Currently   Sexual activity: Not Currently  Other Topics Concern   Not on file  Social History Narrative   [place]; smoking; 1ppd/cutting down. Social. Worked for [employer] from [place].       Said she has been doing ok and does not need any assistance          - is currently on food stamps but those have been enough       NO consent form signed    Social Determinants of Radio broadcast assistant Strain: Low Risk    Difficulty of Paying Living Expenses: Not very hard  Food Insecurity: No Food Insecurity   Worried About Charity fundraiser in the Last Year: Never true   Ran Out of Food in the Last Year: Never true  Transportation Needs: No Transportation Needs   Lack of Transportation (Medical): No   Lack of Transportation (Non-Medical): No  Physical Activity: Sufficiently Active  Days of Exercise per Week: 5 days   Minutes of Exercise per Session: 30 min  Stress: No Stress Concern Present   Feeling of Stress : Only a little  Social Connections: Socially Isolated   Frequency of Communication with Friends and Family: More than three times a week   Frequency of Social Gatherings with Friends and Family: Once a week   Attends Religious Services: Never   Marine scientist or Organizations: No   Attends Archivist Meetings: Never   Marital Status: Widowed  Human resources officer Violence: Not At Risk   Fear of Current or Ex-Partner: No   Emotionally Abused: No   Physically Abused: No   Sexually Abused: No    Outpatient Medications Prior to Visit  Medication Sig Dispense Refill   acetaminophen (TYLENOL) 325 MG tablet Take 2 tablets  (650 mg total) by mouth every 6 (six) hours as needed for mild pain (or Fever >/= 101).     aspirin EC 81 MG tablet Take 81 mg by mouth daily.     metFORMIN (GLUCOPHAGE) 500 MG tablet Take 1 tablet (500 mg total) by mouth daily with breakfast. 60 tablet 0   mirtazapine (REMERON) 15 MG tablet Take 1.5 tablets (22.5 mg total) by mouth at bedtime. 45 tablet 2   atorvastatin (LIPITOR) 80 MG tablet Take 1 tablet (80 mg total) by mouth daily at 6 PM. 30 tablet 2   ATORVASTATIN CALCIUM PO Take 1 tablet by mouth daily. (Patient not taking: Reported on 06/09/2020)     nicotine (NICODERM CQ - DOSED IN MG/24 HOURS) 21 mg/24hr patch Place 1 patch (21 mg total) onto the skin daily. (Patient not taking: Reported on 01/20/2020) 28 patch 0   No facility-administered medications prior to visit.    No Known Allergies  ROS Review of Systems  Constitutional: Negative.   Eyes: Negative.   Respiratory: Negative.   Cardiovascular: Negative.   Endocrine: Negative.   Neurological: Negative.       Objective:    Physical Exam HENT:     Head: Normocephalic and atraumatic.  Cardiovascular:     Rate and Rhythm: Normal rate and regular rhythm.     Pulses: Normal pulses.     Heart sounds: Normal heart sounds.  Pulmonary:     Effort: Pulmonary effort is normal.     Breath sounds: Normal breath sounds.  Neurological:     General: No focal deficit present.     Mental Status: She is alert and oriented to person, place, and time. Mental status is at baseline.  Psychiatric:        Mood and Affect: Mood normal.        Behavior: Behavior normal.        Thought Content: Thought content normal.        Judgment: Judgment normal.     BP 134/80 (BP Location: Right Arm, Patient Position: Sitting, Cuff Size: Normal)    Pulse 81    Temp 97.6 F (36.4 C) (Temporal)    Resp 16    Ht 5\' 4"  (1.626 m)    Wt 164 lb 1.6 oz (74.4 kg)    SpO2 99%    BMI 28.17 kg/m  Wt Readings from Last 3 Encounters:  06/09/20 164 lb  1.6 oz (74.4 kg)  02/17/20 171 lb (77.6 kg)  02/11/20 170 lb (77.1 kg)   She lost 7 pounds in 4 months and was encouraged to continue on her weight loss regimen.   Health  Maintenance Due  Topic Date Due   Hepatitis C Screening  Never done   TETANUS/TDAP  Never done   INFLUENZA VACCINE  05/01/2020   URINE MICROALBUMIN  06/23/2020    There are no preventive care reminders to display for this patient.  Lab Results  Component Value Date   TSH 1.840 04/17/2018   Lab Results  Component Value Date   WBC 8.0 06/24/2019   HGB 15.7 06/24/2019   HCT 45.7 06/24/2019   MCV 93 06/24/2019   PLT 255 06/24/2019   Lab Results  Component Value Date   NA 141 06/02/2020   K 3.9 06/02/2020   CO2 22 06/02/2020   GLUCOSE 124 (H) 06/02/2020   BUN 12 06/02/2020   CREATININE 0.75 06/02/2020   BILITOT 0.8 06/02/2020   ALKPHOS 72 06/02/2020   AST 17 06/02/2020   ALT 18 06/02/2020   PROT 7.0 06/02/2020   ALBUMIN 4.3 06/02/2020   CALCIUM 9.1 06/02/2020   ANIONGAP 10 04/27/2019   Lab Results  Component Value Date   CHOL 117 01/27/2020   Lab Results  Component Value Date   HDL 34 (L) 01/27/2020   Lab Results  Component Value Date   LDLCALC 64 01/27/2020   Lab Results  Component Value Date   TRIG 103 01/27/2020   Lab Results  Component Value Date   CHOLHDL 3.4 01/27/2020   Lab Results  Component Value Date   HGBA1C 6.2 (H) 06/02/2020      Assessment & Plan:    1. Mixed hyperlipidemia - She will continue on current treatment regimen, low fat/low cholesterol diet and exercise as tolerated. - atorvastatin (LIPITOR) 80 MG tablet; Take 1 tablet (80 mg total) by mouth daily at 6 PM.  Dispense: 30 tablet; Refill: 4 - Lipid panel; Future  2. Prediabetes - Her HgbA1c was 6.2%, she will continue on current treatment regimen, was advised on low carb/non concentrated sweet diet. - metFORMIN (GLUCOPHAGE) 500 MG tablet; Take 1 tablet (500 mg total) by mouth daily with breakfast.   Dispense: 30 tablet; Refill: 4 - HgB A1c; Future - Urine Microalbumin w/creat. ratio; Future  3. Smoking - She was encouraged on smoking cessation and Nicotine patch was ordered.    Follow-up: Return in about 5 months (around 11/09/2020), or if symptoms worsen or fail to improve.    Natalie Frey Jerold Coombe, NP

## 2020-06-14 ENCOUNTER — Other Ambulatory Visit: Payer: Self-pay

## 2020-06-14 MED ORDER — NICOTINE 21 MG/24HR TD PT24: 21.0000 mg | MEDICATED_PATCH | Freq: Every day | TRANSDERMAL | 1 refills | Status: DC

## 2020-06-16 ENCOUNTER — Other Ambulatory Visit: Payer: Self-pay

## 2020-06-16 DIAGNOSIS — F418 Other specified anxiety disorders: Secondary | ICD-10-CM

## 2020-06-16 MED ORDER — MIRTAZAPINE 15 MG PO TABS: 22.5000 mg | ORAL_TABLET | Freq: Every day | ORAL | 2 refills | Status: DC

## 2020-06-29 ENCOUNTER — Other Ambulatory Visit: Payer: Self-pay

## 2020-06-29 ENCOUNTER — Ambulatory Visit: Payer: Self-pay | Admitting: Licensed Clinical Social Worker

## 2020-06-29 DIAGNOSIS — F4323 Adjustment disorder with mixed anxiety and depressed mood: Secondary | ICD-10-CM

## 2020-06-29 NOTE — BH Specialist Note (Signed)
Integrated Behavioral Health Follow Up Visit  MRN: [id number] Name: Natalie Frey   Type of Service: [place] Interpretor:No. Interpretor Name and Language: none  SUBJECTIVE: Natalie Frey is a 64 y.o. female accompanied by herself Patient was referred by Carlyon Shadow, NP for mental health. Patient reports the following symptoms/concerns: The patient reports she had a nice time visiting her daughter and grandchildren in [place]. She was unable to visit her son-in-law who was in the hospital because of COVID precautions. The patient stated the ride up to [place] was nice. She states that while she was up there she got back together with her ex boyfriend. She stated that they moved all his stuff back and a greenhouse from [place], and that was very stressful. She states her daughter in [place] was supportive of her decision to get back together with him, but her daughter in [place] is refusing to talk to her now. She states that she "doing very well" and she is glad her boyfriend is back because she had been alone for a year.The patient denies any suicidal or homicidal thoughts. Duration of problem: ; Severity of problem: moderate  OBJECTIVE: Mood: Euthymic and Affect: Appropriate Risk of harm to self or others: No plan to harm self or others  LIFE CONTEXT: Family and Social: see above School/Work: see above Self-Care: see above Life Changes: see above  GOALS ADDRESSED: Patient will: 1.  Reduce symptoms of: anxiety and depression  2.  Increase knowledge and/or ability of: coping skills and stress reduction  3.  Demonstrate ability to: Increase healthy adjustment to current life circumstances  INTERVENTIONS: Interventions utilized:  Supportive Counseling was utilized by the clinician during today's follow-up session. Clinician processed with the patient regarding how she has been doing since the last follow up session. Clinician  measured the patient's anxiety and depression on a numerical scale. Clinician utilized reflective listening encouraging the patient to ventilate her feelings towards her current situation. Clinician encouraged the patient to continue to practice self care and continue to take her psychotropic medications.  Standardized Assessments completed: GAD-7 and PHQ 9  Gad-7 6 PHQ -9 5  ASSESSMENT: Patient currently experiencing see above  Patient may benefit from above  Plan 1. Follow up with behavioral health clinician on : 07/19/2020 at 4:00 pm  2. Behavioral recommendations:  3. Referral(s): [place] (In Clinic) 4. "From scale of 1-10, how likely are you to follow plan?":   Lesli Albee, Student-Social Work

## 2020-06-30 ENCOUNTER — Ambulatory Visit: Payer: Self-pay

## 2020-07-04 ENCOUNTER — Ambulatory Visit
Admission: RE | Admit: 2020-07-04 | Discharge: 2020-07-04 | Disposition: A | Discharge status: Home / Self Care | Payer: Self-pay | Source: Ambulatory Visit | Attending: Nurse Practitioner | Admitting: Nurse Practitioner

## 2020-07-04 ENCOUNTER — Other Ambulatory Visit: Payer: Self-pay

## 2020-07-04 DIAGNOSIS — Z87891 Personal history of nicotine dependence: Secondary | ICD-10-CM | POA: Insufficient documentation

## 2020-07-04 DIAGNOSIS — Z122 Encounter for screening for malignant neoplasm of respiratory organs: Secondary | ICD-10-CM | POA: Insufficient documentation

## 2020-07-06 ENCOUNTER — Encounter: Payer: Self-pay | Admitting: *Deleted

## 2020-07-11 ENCOUNTER — Other Ambulatory Visit: Payer: Self-pay | Admitting: Gerontology

## 2020-07-11 DIAGNOSIS — F418 Other specified anxiety disorders: Secondary | ICD-10-CM

## 2020-07-13 ENCOUNTER — Ambulatory Visit: Payer: Self-pay | Admitting: Pharmacist

## 2020-07-13 DIAGNOSIS — Z79899 Other long term (current) drug therapy: Secondary | ICD-10-CM

## 2020-07-13 NOTE — Progress Notes (Addendum)
Medication Management Clinic Visit Note  Patient: Natalie Frey MRN: [id number] Date of Birth: 15-Dec-1955 PCP: Langston Reusing, NP   Raiford Simmonds 64 y.o. female presents for a telephone medication therapy management visit today with the student pharmacist. Patient expresses no concerns with her current medication therapy  There were no vitals taken for this visit.  Patient Information   Past Medical History:  Diagnosis Date  . Cancer ([place])    bladder (10 years ago, per patient)  . Diabetes mellitus without complication ([place])    type 2  . Dvt femoral (deep venous thrombosis) ([place])   . Hyperlipidemia   . Stroke [facility])    July 2020, per patient      Past Surgical History:  Procedure Laterality Date  . BLADDER SURGERY    . COLONOSCOPY WITH PROPOFOL N/A 02/11/2020   Procedure: COLONOSCOPY WITH PROPOFOL;  Surgeon: Jonathon Bellows, MD;  Location: [facility] ENDOSCOPY;  Service: Gastroenterology;  Laterality: N/A;  . DILATION AND EVACUATION       Family History  Problem Relation Age of Onset  . Deep vein thrombosis Daughter         when pregnant   . Pulmonary embolism Sister        while in hospital; other sister- on BCPs.   . Breast cancer Sister 56  . 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 She makes sure to walk around at work and move around.   Diet: Breakfast: Skips during the weekday because of work at 6 am. During the weekend, she eats cereal, toast, egg, and coffee Lunch: Sandwich (low sodium [place]) or P&B sandwich Dinner: Ground [place], fish Drinks: Water, 1 cup of coffee in the morning, pomegranate juice Snacks: occasionally in the evening, like fruit       Social History   Substance and Sexual Activity  Alcohol Use No      Social History   Tobacco Use  Smoking Status Current Some Day Smoker  . Packs/day: 0.25  . Years: 47.00  . Pack years: 11.75   . Types: Cigarettes  Smokeless Tobacco Never Used  Tobacco Comment   1 pack in 4 days; was able to quit for awhile      Health Maintenance  Topic Date Due  . Hepatitis C Screening  Never done  . TETANUS/TDAP  Never done  . INFLUENZA VACCINE  05/01/2020  . URINE MICROALBUMIN  06/23/2020  . HEMOGLOBIN A1C  11/30/2020  . OPHTHALMOLOGY EXAM  01/04/2021  . FOOT EXAM  02/02/2021  . MAMMOGRAM  02/16/2022  . PAP SMEAR-Modifier  02/17/2023  . COLONOSCOPY  02/11/2025  . PNEUMOCOCCAL POLYSACCHARIDE VACCINE AGE 80-64 HIGH RISK  Completed  . COVID-19 Vaccine  Completed  . HIV Screening  Completed   Health Maintenance/Date Completed  Last ED visit: No   Last Visit to PCP: September 2021  Next Visit to PCP: February 2022 Specialist Visit: Yes Dental Exam: Yes; per patient, around 6 weeks ago Eye Exam: Yes; per patient, around 6-8 months ago Pelvic/PAP Exam: Yes; per patient, around 6 months ago Mammogram: Yes; per patient, around 6 months ago Colonoscopy: Yes; per patient, around 6 months ago Flu Vaccine: Not yet, may get it soon Pneumonia Vaccine: Not yet COVID-19 Vaccine: Yes  Outpatient Encounter Medications as of 07/13/2020  Medication Sig Note  . acetaminophen (TYLENOL) 325 MG tablet Take 2  tablets (650 mg total) by mouth every 6 (six) hours as needed for mild pain (or Fever >/= 101). 07/13/2020: Patient takes 2-3 tablets a week as needed  . aspirin EC 81 MG tablet Take 81 mg by mouth daily.   Marland Kitchen atorvastatin (LIPITOR) 80 MG tablet Take 1 tablet (80 mg total) by mouth daily at 6 PM.   . metFORMIN (GLUCOPHAGE) 500 MG tablet Take 1 tablet (500 mg total) by mouth daily with breakfast.   . mirtazapine (REMERON) 15 MG tablet TAKE 1 1/2 TABLETS (22.5MG ) BY MOUTH AT BEDTIME   . nicotine (NICODERM CQ - DOSED IN MG/24 HOURS) 21 mg/24hr patch Place 1 patch (21 mg total) onto the skin daily.    No facility-administered encounter medications on file as of 07/13/2020.     Assessment and  Plan:  Hyperlipidemia and Stroke: Atorvastatin and aspirin Patient did not have any concerns about medications.  Plan: Continue medications  Insomnia: mirtazapine Patient did not have any concerns about mirtazapine.  Plan: Continue medications  Smoking cessation: nicotine patch Patient was able to quit for awhile, but has started smoking again. She is trying to quit with the help of the patch and slowly decreasing the amount of cigarettes she is using.  Plan: Follow up on smoking cessation  Diabetes: metformin Patient did not have any concerns about metformin. She takes it with a meal. Her most recent A1c was in September 2021 and it was 6.2%, which is in within goal of an A1c <7% per ADA guidelines.  Plan: Continue medications   RTC 1 year Mathis Fare, [facility] of Pharmacy   Cosigned: Cleopatra Cedar. Dicky Doe, PharmD Medication Management Clinic [place] Operations Coordinator [phone]

## 2020-07-18 ENCOUNTER — Other Ambulatory Visit: Payer: Self-pay

## 2020-07-19 ENCOUNTER — Other Ambulatory Visit: Payer: Self-pay

## 2020-07-19 ENCOUNTER — Ambulatory Visit: Payer: Self-pay | Admitting: Licensed Clinical Social Worker

## 2020-07-19 DIAGNOSIS — F4323 Adjustment disorder with mixed anxiety and depressed mood: Secondary | ICD-10-CM

## 2020-07-19 NOTE — BH Specialist Note (Signed)
Integrated Behavioral Health Follow Up Visit  MRN: [id number] Name: Natalie Frey    Type of Service: [place] Interpretor:No. Interpretor Name and Language: none  SUBJECTIVE: Natalie Frey is a 64 y.o. female accompanied by herself  Patient was referred by Carlyon Shadow, NP for mental health. Patient reports the following symptoms/concerns: The patient reports that she had to put down her dog that was over 4 years old. The client described that she did have a few meltdowns but has since been able to regain her composure. She states that she is coping with her loss by planting a tree in her dog's memory and burying the ashes there. She states that she went kayaking for the first time and had so much fun after she relaxed. She notes that kayaking was really a step outside of her comfort zone and her boyfriend really helped push her to try it. The patient discussed relationship and familial stressors in her life. The patient denied suicidal or homicidal thoughts.  Duration of problem: ; Severity of problem: moderate  OBJECTIVE: Mood: Euthymic and Affect: Appropriate Risk of harm to self or others: No plan to harm self or others  LIFE CONTEXT: Family and Social: see above School/Work: see above Self-Care: see above Life Changes: see above  GOALS ADDRESSED: Patient will: 1.  Reduce symptoms of: anxiety, depression and stress  2.  Increase knowledge and/or ability of: coping skills, healthy habits and stress reduction  3.  Demonstrate ability to: Increase healthy adjustment to current life circumstances  INTERVENTIONS: Interventions utilized:  Supportive Counseling was utilized by the clinician during today's follow-up session. The clinician processed with the patient how she has been doing since her last follow-up session. Clincian provided a space for the client to ventilate her frustrations regarding her current life circumstances. Clinician offered  the patient her condolence's on the passing of her dog. The clinician congratulated the patient for coping with the grief and stress by utilizing self care and her coping skills.   Standardized Assessments completed: GAD-7 and PHQ 9  Gad-7   6 PHQ-9  5  ASSESSMENT: Patient currently experiencing see above   Patient may benefit from see above  PLAN: 1. Follow up with behavioral health clinician on : 08/09/2020 at 3:00 pm  2. Behavioral recommendations:  3. Referral(s): [place] (In Clinic) 4. "From scale of 1-10, how likely are you to follow plan?":   Lesli Albee, Student-Social Work

## 2020-08-08 ENCOUNTER — Other Ambulatory Visit: Payer: Self-pay | Admitting: Gerontology

## 2020-08-08 DIAGNOSIS — F418 Other specified anxiety disorders: Secondary | ICD-10-CM

## 2020-08-09 ENCOUNTER — Ambulatory Visit: Payer: Self-pay | Admitting: Licensed Clinical Social Worker

## 2020-08-09 ENCOUNTER — Other Ambulatory Visit: Payer: Self-pay

## 2020-08-09 ENCOUNTER — Other Ambulatory Visit: Payer: Self-pay | Admitting: Gerontology

## 2020-08-09 DIAGNOSIS — F4323 Adjustment disorder with mixed anxiety and depressed mood: Secondary | ICD-10-CM

## 2020-08-09 DIAGNOSIS — F418 Other specified anxiety disorders: Secondary | ICD-10-CM

## 2020-08-09 MED ORDER — MIRTAZAPINE 15 MG PO TABS: ORAL_TABLET | ORAL | 2 refills | Status: DC

## 2020-08-09 NOTE — BH Specialist Note (Signed)
Integrated Behavioral Health Follow Up Visit  MRN: [id number] Name: Natalie Frey    Type of Service: [place] Interpretor:No. Interpretor Name and Language: none  SUBJECTIVE: Natalie Frey is a 64 y.o. female accompanied by Herself Patient was referred by Natalie Shadow, NP for mental health  Patient reports the following symptoms/concerns: The patient discussed that she got her dog's ashes back in a very nice box which surprised her. She stated that she was able to plant a memorial tree and put her ashes there and spread some of them [place] she [place] time with her there. She noted how she had bought the dog for her late husband when she found out he had cancer, and now feels like she lost her last link to her husband. The patient discussed her relationship with her dog and how her house is quiet when she comes home now. She reported that her daughter in [place] is still not talking to her. She stated that she sent her grandchildren Halloween cards but and and is glad that they did not get returned. The patient stated that she is taking her medication as prescribed and feels like "everything is going good right now." The patient denied any suicidal or homicidal thoughts.  Duration of problem: ; Severity of problem: moderate  OBJECTIVE: Mood: Euthymic and Affect: Appropriate Risk of harm to self or others: No plan to harm self or others  LIFE CONTEXT: Family and Social: see above School/Work: see above Self-Care: see above Life Changes: see above  GOALS ADDRESSED: Patient will: 1.  Reduce symptoms of: anxiety, depression and stress  2.  Increase knowledge and/or ability of: coping skills, healthy habits and stress reduction  3.  Demonstrate ability to: Increase healthy adjustment to current life circumstances  INTERVENTIONS: Interventions utilized:  Supportive Counseling was utilized by clinician during today's follow-up session.  Clinician processed with the patient regarding how she has been doing since the last follow up session. Clinician measured the patient's anxiety and depression on a numerical scale. Clinician utilized reflective listening encouraging the patient to ventilate her feelings towards her current situation. Clinician encouraged the patient to continue to practice self care and continue to take her psychotropic medications.  Standardized Assessments completed: GAD-7 and PHQ 9  PHQ-9   6 GAD-7   7  ASSESSMENT: Patient currently experiencing see above  Patient may benefit from see above  PLAN: 1. Follow up with behavioral health clinician on : 09/06/2020@ 4:00 PM  2. Behavioral recommendations:  3. Referral(s): [place] (In Clinic) 4. "From scale of 1-10, how likely are you to follow plan?":   Natalie Frey, Student-Social Work

## 2020-09-06 ENCOUNTER — Other Ambulatory Visit: Payer: Self-pay

## 2020-09-06 ENCOUNTER — Ambulatory Visit: Payer: Self-pay | Admitting: Licensed Clinical Social Worker

## 2020-09-06 DIAGNOSIS — F4323 Adjustment disorder with mixed anxiety and depressed mood: Secondary | ICD-10-CM

## 2020-09-06 NOTE — BH Specialist Note (Signed)
Integrated Behavioral Health Follow Up In-Person Visit  MRN: [id number] Name: Natalie Frey   Total time: 60 minutes  Types of Service: [place] ([facility])  Interpretor:No. Interpretor Name and Language: none  Subjective: Natalie Frey is a 64 y.o. female accompanied by herself Patient was referred by Carlyon Shadow, NP for mental health Patient reports the following symptoms/concerns: The patient reports that she went to [facility] to see a [employer] concert. She said it made her feel old because the lead singer came out on stage walking with a cane. She states she has been very busy. After the concert she notes that she went to the beach with her boyfriend and their dogs and then they drove to [place] and saw Earlean Polka. She notes that she feels happier after taking a break from work. The patient said her boyfriend surprised her with tickets too see the [employer]  in [place] this weekend for her birthday. The patient discussed other familial and relationship stressors. She notes that she is doing really well. The patient denies suicidal or homicidal thoughts.  Duration of problem: ; Severity of problem: moderate  Objective: Mood: Euthymic and Affect: Appropriate Risk of harm to self or others: No plan to harm self or others  Life Context: Family and Social: see above School/Work: see above Self-Care: see above Life Changes: see above  Patient and/or Family's Strengths/Protective Factors: Concrete supports in place (healthy food, safe environments, etc.) and Sense of purpose  Goals Addressed: Patient will: 1.  Reduce symptoms of: anxiety, depression and stress  2.  Increase knowledge and/or ability of: coping skills, self-management skills and stress reduction  3.  Demonstrate ability to: Increase healthy adjustment to current life circumstances  Progress towards Goals: Ongoing  Interventions: Interventions utilized:  Supportive Counseling was  utilized by the clinician during today's follow-up session. Clinician processed with the patient regarding how she has been doing since the last follow up session. Clinician measured the patient's anxiety and depression on a numerical scale. Clinician utilized reflective listening encouraging the patient to ventilate her feelings towards her current situation. Clinician encouraged the patient to continue to practice self care and continue to take her psychotropic medications. The clinician informed the patient of her holiday schedule and discussed that she can call the clinic, go to [facility] walk-in or call [facility] crisis line should she need assistance before her next appointment.  Standardized Assessments completed: GAD-7 and PHQ 9  GAD-7   6 PHQ-9   5     Assessment: Patient currently experiencing see above  Patient may benefit from see above Plan: 1. Follow up with behavioral health clinician on : 10/11/2020 at 4:00 PM 2. Behavioral recommendations:  3. Referral(s): [place] (In Clinic) 4. "From scale of 1-10, how likely are you to follow plan?":   Lesli Albee, Student-Social Work

## 2020-10-11 ENCOUNTER — Ambulatory Visit: Payer: Self-pay | Admitting: Licensed Clinical Social Worker

## 2020-10-11 ENCOUNTER — Other Ambulatory Visit: Payer: Self-pay

## 2020-10-11 ENCOUNTER — Other Ambulatory Visit: Payer: Self-pay | Admitting: Gerontology

## 2020-10-11 DIAGNOSIS — F4323 Adjustment disorder with mixed anxiety and depressed mood: Secondary | ICD-10-CM

## 2020-10-11 DIAGNOSIS — F418 Other specified anxiety disorders: Secondary | ICD-10-CM

## 2020-10-11 MED ORDER — MIRTAZAPINE 15 MG PO TABS: ORAL_TABLET | ORAL | 2 refills | Status: DC

## 2020-10-11 NOTE — BH Specialist Note (Signed)
Integrated Behavioral Health Follow Up In-Person Visit  MRN: [id number] Name: Shawntae Lowy   Total time: 60 minutes  Types of Service: [place] (BHI)  Interpretor:No. Interpretor Name and Language:   Subjective: Willamae Demby is a 65 y.o. female accompanied by herself Patient was referred by Carlyon Shadow, NP  for mental health Patient reports the following symptoms/concerns:  The patient reports that is doing good. She states that her coworker is grating on her nerves. She notes that the coworker comes to work briefly while on [place] leave. 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 She notes that customers often tell her they or others are sick. The patient discussed other COVID related stressors. The patient stated that she had a good Christmas and that her boyfriend and her were able to attend a couple of concerts and events last month. She stated her boyfriend's mother was difficult to deal with at times and was not happy that they did not go to visit them. The patient noted that she was able to utilize her coping skills to not lose her temper even though she wanted to. The patient discussed the forecasted snow and her plans to stay put in the event of bad weather. The patient denied any suicidal or homicidal thoughts.  Duration of problem: ; Severity of problem: moderate  Objective: Mood: Euthymic and Affect: Appropriate Risk of harm to self or others: No plan to harm self or others  Life Context: Family and Social: see above School/Work: see above Self-Care: see above Life Changes: see above  Patient and/or Family's Strengths/Protective Factors: Concrete supports in place (healthy food, safe environments, etc.)  Goals Addressed: Patient will: 1.  Reduce symptoms of: anxiety and depression  2.  Increase knowledge and/or ability of: coping skills, self-management skills and stress reduction  3.   Demonstrate ability to: Increase healthy adjustment to current life circumstances  Progress towards Goals: Ongoing  Interventions: Interventions utilized:  Supportive Counseling was utilized by the clinician during today's follow up session. Clinician processed with the patient regarding how she has been doing since the last follow up session. Clinician measured the patient's anxiety and depression on a numerical scale. Clinician utilized reflective listening encouraging the patient to ventilate her feelings towards her current situation. Clinician offered the patient praise for utilizing her coping skills to deal with her situation and for utilizing self care to recharge. The clinician reflected with the patient on the progress she has made. Standardized Assessments completed: GAD-7 and PHQ 9 GAD-7   6  PHQ-9   5  Assessment: Patient currently experiencing see above  Patient may benefit from see above  Plan: 1. Follow up with behavioral health clinician on : 11/01/2020 at 4:00 PM  2. Behavioral recommendations:  3. Referral(s): [place] (In Clinic) 4. "From scale of 1-10, how likely are you to follow plan?":   Lesli Albee, Student-Social Work

## 2020-10-13 ENCOUNTER — Other Ambulatory Visit: Payer: Self-pay

## 2020-10-18 ENCOUNTER — Ambulatory Visit: Payer: Self-pay | Admitting: Gerontology

## 2020-10-31 ENCOUNTER — Other Ambulatory Visit: Payer: Self-pay | Admitting: Gerontology

## 2020-10-31 DIAGNOSIS — R7303 Prediabetes: Secondary | ICD-10-CM

## 2020-11-01 ENCOUNTER — Ambulatory Visit: Payer: Self-pay | Admitting: Licensed Clinical Social Worker

## 2020-11-01 ENCOUNTER — Other Ambulatory Visit: Payer: Self-pay

## 2020-11-01 DIAGNOSIS — F4323 Adjustment disorder with mixed anxiety and depressed mood: Secondary | ICD-10-CM

## 2020-11-01 NOTE — BH Specialist Note (Signed)
Integrated Behavioral Health Follow Up In-Person Visit  MRN: [id number] Name: Natalie Frey  Total time: 60 minutes  Types of Service: [place] (BHI)  Interpretor:No. Interpretor Name and Language:  Subjective: Natalie Frey is a 65 y.o. female accompanied by herself Patient was referred by Carlyon Shadow, NP  for mental health Patient reports the following symptoms/concerns: The patient reports that she has been doing well. She notes that she doesn't enjoy the cold and ice. She noted that her daughter and sister who live in [place] and [place] got over a foot over snow from the latest storm. She notes that she has anxiety about driving in the snow and especially if there is a chance that there might be ice on the roads. She noted that her boyfriend drove her to work one morning but felt bad after noticing the roads were not as bad a she thought they were. She stated that the next day she still was feeling a little anxious about driving so she called out. The patient notes that she doesn't ever really call out of work so she didn't feel too bad about that. The patient reports that she is sleeping very well now, and has been able to sleep completely through the night on several occasions lately. The patient discussed the new building the [facility] is in, and her upcoming appointment for lab work. The patient denied any suicidal or homicidal thoughts.  Duration of problem: ; Severity of problem: moderate  Objective: Mood: Euthymic and Affect: Appropriate Risk of harm to self or others: No plan to harm self or others  Life Context: Family and Social: see above School/Work: see above  Self-Care: see above Life Changes: see above  Patient and/or Family's Strengths/Protective Factors: Concrete supports in place (healthy food, safe environments, etc.)  Goals Addressed: Patient will: 1.  Reduce symptoms of: anxiety, depression and stress  2.  Increase  knowledge and/or ability of: coping skills, healthy habits and stress reduction  3.  Demonstrate ability to: Increase healthy adjustment to current life circumstances  Progress towards Goals: Ongoing  Interventions: Interventions utilized:  Supportive Counseling was utilized by the clinician during today's follow up session. Clinician processed with the patient regarding how she has been doing since the last follow up session. Clinician measured the patient's anxiety and depression on a numerical scale. Clinician congratulated patient on utilizing her coping skills and doing well since the last session. Clinician encouraged the patient to continue to focus on the positives in her life and what is in her control verses what is not in her control  Standardized Assessments completed: GAD-7 and PHQ 9  PHQ-9  6 GAD-7  5   Assessment: Patient currently experiencing see above  Patient may benefit from see above.  Plan: 1. Follow up with behavioral health clinician on : 11/29/2020 at 5:00 PM  2. Behavioral recommendations:  3. Referral(s): [place] (In Clinic) 4. "From scale of 1-10, how likely are you to follow plan?":   Lesli Albee, Student-Social Work

## 2020-11-03 ENCOUNTER — Other Ambulatory Visit: Payer: Self-pay

## 2020-11-03 ENCOUNTER — Other Ambulatory Visit: Payer: Self-pay | Admitting: Gerontology

## 2020-11-03 DIAGNOSIS — R7303 Prediabetes: Secondary | ICD-10-CM

## 2020-11-03 DIAGNOSIS — E782 Mixed hyperlipidemia: Secondary | ICD-10-CM

## 2020-11-04 LAB — LIPID PANEL
Chol/HDL Ratio: 3.1 ratio (ref 0.0–4.4)
Cholesterol, Total: 137 mg/dL (ref 100–199)
HDL: 44 mg/dL (ref >39)
LDL Chol Calc (NIH): 75 mg/dL (ref 0–99)
Triglycerides: 99 mg/dL (ref 0–149)
VLDL Cholesterol Cal: 18 mg/dL (ref 5–40)

## 2020-11-04 LAB — HEMOGLOBIN A1C
Est. average glucose Bld gHb Est-mCnc: 128 mg/dL
Hgb A1c MFr Bld: 6.1 % — ABNORMAL HIGH (ref 4.8–5.6)

## 2020-11-04 LAB — MICROALBUMIN / CREATININE URINE RATIO
Creatinine, Urine: 31.3 mg/dL
Microalb/Creat Ratio: <10 mg/g creat (ref 0–29)
Microalbumin, Urine: <3 ug/mL

## 2020-11-21 ENCOUNTER — Other Ambulatory Visit: Payer: Self-pay | Admitting: Gerontology

## 2020-11-21 DIAGNOSIS — E782 Mixed hyperlipidemia: Secondary | ICD-10-CM

## 2020-11-22 ENCOUNTER — Other Ambulatory Visit: Payer: Self-pay

## 2020-11-22 ENCOUNTER — Telehealth: Payer: Self-pay | Admitting: Gerontology

## 2020-11-22 ENCOUNTER — Other Ambulatory Visit: Payer: Self-pay | Admitting: Gerontology

## 2020-11-22 ENCOUNTER — Encounter: Payer: Self-pay | Admitting: Gerontology

## 2020-11-22 VITALS — BP 132/81 | HR 85 | Temp 97.0°F | Resp 16 | Wt 170.6 lb

## 2020-11-22 DIAGNOSIS — R32 Unspecified urinary incontinence: Secondary | ICD-10-CM

## 2020-11-22 DIAGNOSIS — R7303 Prediabetes: Secondary | ICD-10-CM

## 2020-11-22 DIAGNOSIS — M7989 Other specified soft tissue disorders: Secondary | ICD-10-CM

## 2020-11-22 NOTE — Progress Notes (Signed)
Established Patient Office Visit  Subjective:  Patient ID: Natalie Frey, female    DOB: 01-Sep-1956  Age: 65 y.o. MRN: [id number]  CC: No chief complaint on file.   HPI Natalie Frey 65 y/o female with history of Type 2 diabetes, DVT, hyperlipidemia, stroke presents for follow up of Prediabetes and lab review. Her HgbA1c done on 11/03/20 decreased from 6.2% to 6.1%. She's compliant on her medication and continues to make healthy lifestyle changes. Her lipid panel was within normal limit. She also c/o that her passery is not effective with regards to her uterine prolapse. She states that she will schedule an appointment with [facility] Urogynecology clinic. she also c/o of bilateral lower extremity swelling with left greater than right after working 8 hours standing 95% of the time. She reports wearing compression stockings and comfortable shoes. She states that the bilateral lower extremity swelling resolves with elevating her legs and denies any swelling the days she's off from work. She denies claudication, and erythema. She denies chest pain, palpitation, dizziness, and myalgia. Overall, she states that she's doing well and offers no further complaint.    Past Medical History:  Diagnosis Date  . Cancer ([place])    bladder (10 years ago, per patient)  . Diabetes mellitus without complication ([place])    type 2  . Dvt femoral (deep venous thrombosis) ([place])   . Hyperlipidemia   . Stroke [facility])    July 2020, per patient    Past Surgical History:  Procedure Laterality Date  . BLADDER SURGERY    . COLONOSCOPY WITH PROPOFOL N/A 02/11/2020   Procedure: COLONOSCOPY WITH PROPOFOL;  Surgeon: Jonathon Bellows, MD;  Location: [facility] ENDOSCOPY;  Service: Gastroenterology;  Laterality: N/A;  . DILATION AND EVACUATION      Family History  Problem Relation Age of Onset  . Deep vein thrombosis Daughter         when pregnant   . Pulmonary embolism Sister        while in hospital; other sister- on BCPs.   . Breast cancer  Sister 61  . Diabetes Mother     Social History   Socioeconomic History  . Marital status: Widowed    Spouse name: Not on file  . Number of children: 2  . Years of education: Not on file  . Highest education level: High school graduate  Occupational History  . Not on file  Tobacco Use  . Smoking status: Current Some Day Smoker    Packs/day: 0.25    Years: 47.00    Pack years: 11.75    Types: Cigarettes  . Smokeless tobacco: Never Used  . Tobacco comment: 1 pack in 4 days; was able to quit for awhile  Vaping Use  . Vaping Use: Never used  Substance and Sexual Activity  . Alcohol use: No  . Drug use: Not Currently  . Sexual activity: Not Currently  Other Topics Concern  . Not on file  Social History Narrative   [place]; smoking; 1ppd/cutting down. Social. Worked for [employer] from [place].       Said she has been doing ok and does not need any assistance          - is currently on food stamps but those have been enough       NO consent form signed    Social Determinants of Health   Financial Resource Strain: Low Risk   . Difficulty of Paying Living Expenses: Not very hard  Food Insecurity: No  Food Insecurity  . Worried About Charity fundraiser in the Last Year: Never true  . Ran Out of Food in the Last Year: Never true  Transportation Needs: No Transportation Needs  . Lack of Transportation (Medical): No  . Lack of Transportation (Non-Medical): No  Physical Activity: Sufficiently Active  . Days of Exercise per Week: 5 days  . Minutes of Exercise per Session: 30 min  Stress: No Stress Concern Present  . Feeling of Stress : Only a little  Social Connections: Socially Isolated  . Frequency of Communication with Friends and Family: More than three times a week  . Frequency of Social Gatherings with Friends and Family: Once a week  . Attends Religious Services: Never  . Active Member of Clubs or Organizations: No  . Attends Archivist Meetings:  Never  . Marital Status: Widowed  Intimate Partner Violence: Not At Risk  . Fear of Current or Ex-Partner: No  . Emotionally Abused: No  . Physically Abused: No  . Sexually Abused: No    Outpatient Medications Prior to Visit  Medication Sig Dispense Refill  . acetaminophen (TYLENOL) 325 MG tablet Take 2 tablets (650 mg total) by mouth every 6 (six) hours as needed for mild pain (or Fever >/= 101).    Marland Kitchen aspirin EC 81 MG tablet Take 81 mg by mouth daily.    Marland Kitchen atorvastatin (LIPITOR) 80 MG tablet TAKE ONE TABLET BY MOUTH EVERY DAY AT 6:00pm 90 tablet 0  . metFORMIN (GLUCOPHAGE) 500 MG tablet TAKE 1 TABLET BY MOUTH DAILY WITH BREAKFAST 90 tablet 0  . mirtazapine (REMERON) 15 MG tablet TAKE 1 1/2 TABLETS (22.5MG ) BY MOUTH AT BEDTIME 45 tablet 2  . nicotine (NICODERM CQ - DOSED IN MG/24 HOURS) 21 mg/24hr patch Place 1 patch (21 mg total) onto the skin daily. 28 patch 1   No facility-administered medications prior to visit.    No Known Allergies  ROS Review of Systems  Constitutional: Negative.   Eyes: Negative.   Respiratory: Negative.   Cardiovascular: Negative.   Musculoskeletal: Negative.   Skin: Negative.   Neurological: Negative.   Psychiatric/Behavioral: Negative.       Objective:    Physical Exam HENT:     Head: Normocephalic and atraumatic.  Eyes:     Extraocular Movements: Extraocular movements intact.     Conjunctiva/sclera: Conjunctivae normal.     Pupils: Pupils are equal, round, and reactive to light.  Cardiovascular:     Rate and Rhythm: Normal rate and regular rhythm.     Pulses: Normal pulses.     Heart sounds: Normal heart sounds.  Pulmonary:     Effort: Pulmonary effort is normal.     Breath sounds: Normal breath sounds.  Musculoskeletal:        General: Normal range of motion.  Neurological:     General: No focal deficit present.     Mental Status: She is alert and oriented to person, place, and time. Mental status is at baseline.  Psychiatric:         Mood and Affect: Mood normal.        Behavior: Behavior normal.        Thought Content: Thought content normal.        Judgment: Judgment normal.     BP 132/81 (BP Location: Left Arm, Patient Position: Sitting, Cuff Size: Large)   Pulse 85   Temp (!) 97 F (36.1 C)   Resp 16   Wt 170 lb  9.6 oz (77.4 kg)   SpO2 98%   BMI 29.28 kg/m  Wt Readings from Last 3 Encounters:  11/22/20 170 lb 9.6 oz (77.4 kg)  07/04/20 164 lb (74.4 kg)  06/09/20 164 lb 1.6 oz (74.4 kg)     Health Maintenance Due  Topic Date Due  . Hepatitis C Screening  Never done  . TETANUS/TDAP  Never done  . INFLUENZA VACCINE  05/01/2020  . COVID-19 Vaccine (3 - Booster for Pfizer series) 07/06/2020    There are no preventive care reminders to display for this patient.  Lab Results  Component Value Date   TSH 1.840 04/17/2018   Lab Results  Component Value Date   WBC 8.0 06/24/2019   HGB 15.7 06/24/2019   HCT 45.7 06/24/2019   MCV 93 06/24/2019   PLT 255 06/24/2019   Lab Results  Component Value Date   NA 141 06/02/2020   K 3.9 06/02/2020   CO2 22 06/02/2020   GLUCOSE 124 (H) 06/02/2020   BUN 12 06/02/2020   CREATININE 0.75 06/02/2020   BILITOT 0.8 06/02/2020   ALKPHOS 72 06/02/2020   AST 17 06/02/2020   ALT 18 06/02/2020   PROT 7.0 06/02/2020   ALBUMIN 4.3 06/02/2020   CALCIUM 9.1 06/02/2020   ANIONGAP 10 04/27/2019   Lab Results  Component Value Date   CHOL 137 11/03/2020   Lab Results  Component Value Date   HDL 44 11/03/2020   Lab Results  Component Value Date   LDLCALC 75 11/03/2020   Lab Results  Component Value Date   TRIG 99 11/03/2020   Lab Results  Component Value Date   CHOLHDL 3.1 11/03/2020   Lab Results  Component Value Date   HGBA1C 6.1 (H) 11/03/2020      Assessment & Plan:    1. Prediabetes - Her HgbA1c was 6.1%, she declines increasing her Metformin stating that she will work on her diet. She was encouraged to continue on low carb/non  concentrated sweet diet.  2. Leg swelling - She was advised to continue wearing her compression stockings, elevate her legs while sitting down and notify clinic with claudication, worsening swelling and erythema.  3. Urinary incontinence, unspecified type - She was encouraged to schedule an appointment with [facility] Urogynecology for her Uterine prolapse.    Follow-up: Return in about 3 months (around 02/23/2021), or if symptoms worsen or fail to improve.    Greenley Martone Jerold Coombe, NP

## 2020-11-22 NOTE — Patient Instructions (Addendum)
https://www.nhlbi.nih.gov/files/docs/public/heart/dash_brief.pdf">  DASH Eating Plan DASH stands for Dietary Approaches to Stop Hypertension. The DASH eating plan is a healthy eating plan that has been shown to:  Reduce high blood pressure (hypertension).  Reduce your risk for type 2 diabetes, heart disease, and stroke.  Help with weight loss. What are tips for following this plan? Reading food labels  Check food labels for the amount of salt (sodium) per serving. Choose foods with less than 5 percent of the Daily Value of sodium. Generally, foods with less than 300 milligrams (mg) of sodium per serving fit into this eating plan.  To find whole grains, look for the word "whole" as the first word in the ingredient list. Shopping  Buy products labeled as "low-sodium" or "no salt added."  Buy fresh foods. Avoid canned foods and pre-made or frozen meals. Cooking  Avoid adding salt when cooking. Use salt-free seasonings or herbs instead of table salt or sea salt. Check with your health care provider or pharmacist before using salt substitutes.  Do not fry foods. Cook foods using healthy methods such as baking, boiling, grilling, roasting, and broiling instead.  Cook with heart-healthy oils, such as olive, canola, avocado, soybean, or sunflower oil. Meal planning  Eat a balanced diet that includes: ? 4 or more servings of fruits and 4 or more servings of vegetables each day. Try to fill one-half of your plate with fruits and vegetables. ? 6-8 servings of whole grains each day. ? Less than 6 oz (170 g) of lean meat, poultry, or fish each day. A 3-oz (85-g) serving of meat is about the same size as a deck of cards. One egg equals 1 oz (28 g). ? 2-3 servings of low-fat dairy each day. One serving is 1 cup (237 mL). ? 1 serving of nuts, seeds, or beans 5 times each week. ? 2-3 servings of heart-healthy fats. Healthy fats called omega-3 fatty acids are found in foods such as walnuts,  flaxseeds, fortified milks, and eggs. These fats are also found in cold-water fish, such as sardines, salmon, and mackerel.  Limit how much you eat of: ? Canned or prepackaged foods. ? Food that is high in trans fat, such as some fried foods. ? Food that is high in saturated fat, such as fatty meat. ? Desserts and other sweets, sugary drinks, and other foods with added sugar. ? Full-fat dairy products.  Do not salt foods before eating.  Do not eat more than 4 egg yolks a week.  Try to eat at least 2 vegetarian meals a week.  Eat more home-cooked food and less restaurant, buffet, and fast food.   Lifestyle  When eating at a restaurant, ask that your food be prepared with less salt or no salt, if possible.  If you drink alcohol: ? Limit how much you use to:  0-1 drink a day for women who are not pregnant.  0-2 drinks a day for men. ? Be aware of how much alcohol is in your drink. In the [place] one drink equals one 12 oz bottle of beer (355 mL), one 5 oz glass of wine (148 mL), or one 1 oz glass of hard liquor (44 mL). General information  Avoid eating more than 2,300 mg of salt a day. If you have hypertension, you may need to reduce your sodium intake to 1,500 mg a day.  Work with your health care provider to maintain a healthy body weight or to lose weight. Ask what an ideal weight is for   you.  Get at least 30 minutes of exercise that causes your heart to beat faster (aerobic exercise) most days of the week. Activities may include walking, swimming, or biking.  Work with your health care provider or dietitian to adjust your eating plan to your individual calorie needs. What foods should I eat? Fruits All fresh, dried, or frozen fruit. Canned fruit in natural juice (without added sugar). Vegetables Fresh or frozen vegetables (raw, steamed, roasted, or grilled). Low-sodium or reduced-sodium tomato and vegetable juice. Low-sodium or reduced-sodium tomato sauce and tomato paste.  Low-sodium or reduced-sodium canned vegetables. Grains Whole-grain or whole-wheat bread. Whole-grain or whole-wheat pasta. Brown rice. Oatmeal. Quinoa. Bulgur. Whole-grain and low-sodium cereals. Pita bread. Low-fat, low-sodium crackers. Whole-wheat flour tortillas. Meats and other proteins Skinless chicken or turkey. Ground chicken or turkey. Pork with fat trimmed off. Fish and seafood. Egg whites. Dried beans, peas, or lentils. Unsalted nuts, nut butters, and seeds. Unsalted canned beans. Lean cuts of beef with fat trimmed off. Low-sodium, lean precooked or cured meat, such as sausages or meat loaves. Dairy Low-fat (1%) or fat-free (skim) milk. Reduced-fat, low-fat, or fat-free cheeses. Nonfat, low-sodium ricotta or cottage cheese. Low-fat or nonfat yogurt. Low-fat, low-sodium cheese. Fats and oils Soft margarine without trans fats. Vegetable oil. Reduced-fat, low-fat, or light mayonnaise and salad dressings (reduced-sodium). Canola, safflower, olive, avocado, soybean, and sunflower oils. Avocado. Seasonings and condiments Herbs. Spices. Seasoning mixes without salt. Other foods Unsalted popcorn and pretzels. Fat-free sweets. The items listed above may not be a complete list of foods and beverages you can eat. Contact a dietitian for more information. What foods should I avoid? Fruits Canned fruit in a light or heavy syrup. Fried fruit. Fruit in cream or butter sauce. Vegetables Creamed or fried vegetables. Vegetables in a cheese sauce. Regular canned vegetables (not low-sodium or reduced-sodium). Regular canned tomato sauce and paste (not low-sodium or reduced-sodium). Regular tomato and vegetable juice (not low-sodium or reduced-sodium). Pickles. Olives. Grains Baked goods made with fat, such as croissants, muffins, or some breads. Dry pasta or rice meal packs. Meats and other proteins Fatty cuts of meat. Ribs. Fried meat. Bacon. Bologna, salami, and other precooked or cured meats, such as  sausages or meat loaves. Fat from the back of a pig (fatback). Bratwurst. Salted nuts and seeds. Canned beans with added salt. Canned or smoked fish. Whole eggs or egg yolks. Chicken or turkey with skin. Dairy Whole or 2% milk, cream, and half-and-half. Whole or full-fat cream cheese. Whole-fat or sweetened yogurt. Full-fat cheese. Nondairy creamers. Whipped toppings. Processed cheese and cheese spreads. Fats and oils Butter. Stick margarine. Lard. Shortening. Ghee. Bacon fat. Tropical oils, such as coconut, palm kernel, or palm oil. Seasonings and condiments Onion salt, garlic salt, seasoned salt, table salt, and sea salt. Worcestershire sauce. Tartar sauce. Barbecue sauce. Teriyaki sauce. Soy sauce, including reduced-sodium. Steak sauce. Canned and packaged gravies. Fish sauce. Oyster sauce. Cocktail sauce. Store-bought horseradish. Ketchup. Mustard. Meat flavorings and tenderizers. Bouillon cubes. Hot sauces. Pre-made or packaged marinades. Pre-made or packaged taco seasonings. Relishes. Regular salad dressings. Other foods Salted popcorn and pretzels. The items listed above may not be a complete list of foods and beverages you should avoid. Contact a dietitian for more information. Where to find more information  National Heart, Lung, and Blood Institute: www.nhlbi.nih.gov  American Heart Association: www.heart.org  Academy of Nutrition and Dietetics: www.eatright.org  National Kidney Foundation: www.kidney.org Summary  The DASH eating plan is a healthy eating plan that has been shown to   reduce high blood pressure (hypertension). It may also reduce your risk for type 2 diabetes, heart disease, and stroke.  When on the DASH eating plan, aim to eat more fresh fruits and vegetables, whole grains, lean proteins, low-fat dairy, and heart-healthy fats.  With the DASH eating plan, you should limit salt (sodium) intake to 2,300 mg a day. If you have hypertension, you may need to reduce your  sodium intake to 1,500 mg a day.  Work with your health care provider or dietitian to adjust your eating plan to your individual calorie needs. This information is not intended to replace advice given to you by your health care provider. Make sure you discuss any questions you have with your health care provider. Document Revised: 08/21/2019 Document Reviewed: 08/21/2019 Elsevier Patient Education  2021 [place].  Prediabetes Eating Plan Prediabetes is a condition that causes blood sugar (glucose) levels to be higher than normal. This increases the risk for developing type 2 diabetes (type 2 diabetes mellitus). Working with a health care provider or nutrition specialist (dietitian) to make diet and lifestyle changes can help prevent the onset of diabetes. These changes may help you:  Control your blood glucose levels.  Improve your cholesterol levels.  Manage your blood pressure. What are tips for following this plan? Reading food labels  Read food labels to check the amount of fat, salt (sodium), and sugar in prepackaged foods. Avoid foods that have: ? Saturated fats. ? Trans fats. ? Added sugars.  Avoid foods that have more than 300 milligrams (mg) of sodium per serving. Limit your sodium intake to less than 2,300 mg each day. Shopping  Avoid buying pre-made and processed foods.  Avoid buying drinks with added sugar. Cooking  Cook with olive oil. Do not use butter, lard, or ghee.  Bake, broil, grill, steam, or boil foods. Avoid frying. Meal planning  Work with your dietitian to create an eating plan that is right for you. This may include tracking how many calories you take in each day. Use a food diary, notebook, or mobile application to track what you eat at each meal.  Consider following a Mediterranean diet. This includes: ? Eating several servings of fresh fruits and vegetables each day. ? Eating fish at least twice a week. ? Eating one serving each day of whole  grains, beans, nuts, and seeds. ? Using olive oil instead of other fats. ? Limiting alcohol. ? Limiting red meat. ? Using nonfat or low-fat dairy products.  Consider following a plant-based diet. This includes dietary choices that focus on eating mostly vegetables and fruit, grains, beans, nuts, and seeds.  If you have high blood pressure, you may need to limit your sodium intake or follow a diet such as the DASH (Dietary Approaches to Stop Hypertension) eating plan. The DASH diet aims to lower high blood pressure.   Lifestyle  Set weight loss goals with help from your health care team. It is recommended that most people with prediabetes lose 7% of their body weight.  Exercise for at least 30 minutes 5 or more days a week.  Attend a support group or seek support from a mental health counselor.  Take over-the-counter and prescription medicines only as told by your health care provider. What foods are recommended? Fruits Berries. Bananas. Apples. Oranges. Grapes. Papaya. Mango. Pomegranate. Kiwi. Grapefruit. Cherries. Vegetables Lettuce. Spinach. Peas. Beets. Cauliflower. Cabbage. Broccoli. Carrots. Tomatoes. Squash. Eggplant. Herbs. Peppers. Onions. Cucumbers. Brussels sprouts. Grains Whole grains, such as whole-wheat or whole-grain breads,  crackers, cereals, and pasta. Unsweetened oatmeal. Bulgur. Barley. Quinoa. Brown rice. Corn or whole-wheat flour tortillas or taco shells. Meats and other proteins Seafood. Poultry without skin. Lean cuts of pork and beef. Tofu. Eggs. Nuts. Beans. Dairy Low-fat or fat-free dairy products, such as yogurt, cottage cheese, and cheese. Beverages Water. Tea. Coffee. Sugar-free or diet soda. Seltzer water. Low-fat or nonfat milk. Milk alternatives, such as soy or almond milk. Fats and oils Olive oil. Canola oil. Sunflower oil. Grapeseed oil. Avocado. Walnuts. Sweets and desserts Sugar-free or low-fat pudding. Sugar-free or low-fat ice cream and other  frozen treats. Seasonings and condiments Herbs. Sodium-free spices. Mustard. Relish. Low-salt, low-sugar ketchup. Low-salt, low-sugar barbecue sauce. Low-fat or fat-free mayonnaise. The items listed above may not be a complete list of recommended foods and beverages. Contact a dietitian for more information. What foods are not recommended? Fruits Fruits canned with syrup. Vegetables Canned vegetables. Frozen vegetables with butter or cream sauce. Grains Refined white flour and flour products, such as bread, pasta, snack foods, and cereals. Meats and other proteins Fatty cuts of meat. Poultry with skin. Breaded or fried meat. Processed meats. Dairy Full-fat yogurt, cheese, or milk. Beverages Sweetened drinks, such as iced tea and soda. Fats and oils Butter. Lard. Ghee. Sweets and desserts Baked goods, such as cake, cupcakes, pastries, cookies, and cheesecake. Seasonings and condiments Spice mixes with added salt. Ketchup. Barbecue sauce. Mayonnaise. The items listed above may not be a complete list of foods and beverages that are not recommended. Contact a dietitian for more information. Where to find more information  American Diabetes Association: www.diabetes.org Summary  You may need to make diet and lifestyle changes to help prevent the onset of diabetes. These changes can help you control blood sugar, improve cholesterol levels, and manage blood pressure.  Set weight loss goals with help from your health care team. It is recommended that most people with prediabetes lose 7% of their body weight.  Consider following a Mediterranean diet. This includes eating plenty of fresh fruits and vegetables, whole grains, beans, nuts, seeds, fish, and low-fat dairy, and using olive oil instead of other fats. This information is not intended to replace advice given to you by your health care provider. Make sure you discuss any questions you have with your health care provider. Document  Revised: 12/17/2019 Document Reviewed: 12/17/2019 Elsevier Patient Education  [place] Many factors influence your heart (coronary) health, including eating and exercise habits. Coronary risk increases with abnormal blood fat (lipid) levels. Heart-healthy meal planning includes limiting unhealthy fats, increasing healthy fats, and making other diet and lifestyle changes. What is my plan? Your health care provider may recommend that you:  Limit your fat intake to _________% or less of your total calories each day.  Limit your saturated fat intake to _________% or less of your total calories each day.  Limit the amount of cholesterol in your diet to less than _________ mg per day. What are tips for following this plan? Cooking Cook foods using methods other than frying. Baking, boiling, grilling, and broiling are all good options. Other ways to reduce fat include:  Removing the skin from poultry.  Removing all visible fats from meats.  Steaming vegetables in water or broth. Meal planning  At meals, imagine dividing your plate into fourths: ? Fill one-half of your plate with vegetables and green salads. ? Fill one-fourth of your plate with whole grains. ? Fill one-fourth of your plate with lean protein foods.  Eat 4-5 servings of vegetables per day. One serving equals 1 cup raw or cooked vegetable, or 2 cups raw leafy greens.  Eat 4-5 servings of fruit per day. One serving equals 1 medium whole fruit,  cup dried fruit,  cup fresh, frozen, or canned fruit, or  cup 100% fruit juice.  Eat more foods that contain soluble fiber. Examples include apples, broccoli, carrots, beans, peas, and barley. Aim to get 25-30 g of fiber per day.  Increase your consumption of legumes, nuts, and seeds to 4-5 servings per week. One serving of dried beans or legumes equals  cup cooked, 1 serving of nuts is  cup, and 1 serving of seeds equals 1 tablespoon.    Fats  Choose healthy fats more often. Choose monounsaturated and polyunsaturated fats, such as olive and canola oils, flaxseeds, walnuts, almonds, and seeds.  Eat more omega-3 fats. Choose salmon, mackerel, sardines, tuna, flaxseed oil, and ground flaxseeds. Aim to eat fish at least 2 times each week.  Check food labels carefully to identify foods with trans fats or high amounts of saturated fat.  Limit saturated fats. These are found in animal products, such as meats, butter, and cream. Plant sources of saturated fats include palm oil, palm kernel oil, and coconut oil.  Avoid foods with partially hydrogenated oils in them. These contain trans fats. Examples are stick margarine, some tub margarines, cookies, crackers, and other baked goods.  Avoid fried foods. General information  Eat more home-cooked food and less restaurant, buffet, and fast food.  Limit or avoid alcohol.  Limit foods that are high in starch and sugar.  Lose weight if you are overweight. Losing just 5-10% of your body weight can help your overall health and prevent diseases such as diabetes and heart disease.  Monitor your salt (sodium) intake, especially if you have high blood pressure. Talk with your health care provider about your sodium intake.  Try to incorporate more vegetarian meals weekly. What foods can I eat? Fruits All fresh, canned (in natural juice), or frozen fruits. Vegetables Fresh or frozen vegetables (raw, steamed, roasted, or grilled). Green salads. Grains Most grains. Choose whole wheat and whole grains most of the time. Rice and pasta, including brown rice and pastas made with whole wheat. Meats and other proteins Lean, well-trimmed beef, veal, pork, and lamb. Chicken and [place] without skin. All fish and shellfish. Wild duck, rabbit, pheasant, and venison. Egg whites or low-cholesterol egg substitutes. Dried beans, peas, lentils, and tofu. Seeds and most nuts. Dairy Low-fat or nonfat  cheeses, including ricotta and mozzarella. Skim or 1% milk (liquid, powdered, or evaporated). Buttermilk made with low-fat milk. Nonfat or low-fat yogurt. Fats and oils Non-hydrogenated (trans-free) margarines. Vegetable oils, including soybean, sesame, sunflower, olive, peanut, safflower, corn, canola, and cottonseed. Salad dressings or mayonnaise made with a vegetable oil. Beverages Water (mineral or sparkling). Coffee and tea. Diet carbonated beverages. Sweets and desserts Sherbet, gelatin, and fruit ice. Small amounts of dark chocolate. Limit all sweets and desserts. Seasonings and condiments All seasonings and condiments. The items listed above may not be a complete list of foods and beverages you can eat. Contact a dietitian for more options. What foods are not recommended? Fruits Canned fruit in heavy syrup. Fruit in cream or butter sauce. Fried fruit. Limit coconut. Vegetables Vegetables cooked in cheese, cream, or butter sauce. Fried vegetables. Grains Breads made with saturated or trans fats, oils, or whole milk. Croissants. Sweet rolls. Donuts. High-fat crackers, such as cheese crackers. Meats and  other proteins Fatty meats, such as hot dogs, ribs, sausage, bacon, rib-eye roast or steak. High-fat deli meats, such as salami and bologna. Caviar. Domestic duck and goose. Organ meats, such as liver. Dairy Cream, sour cream, cream cheese, and creamed cottage cheese. Whole milk cheeses. Whole or 2% milk (liquid, evaporated, or condensed). Whole buttermilk. Cream sauce or high-fat cheese sauce. Whole-milk yogurt. Fats and oils Meat fat, or shortening. Cocoa butter, hydrogenated oils, palm oil, coconut oil, palm kernel oil. Solid fats and shortenings, including bacon fat, salt pork, lard, and butter. Nondairy cream substitutes. Salad dressings with cheese or sour cream. Beverages Regular sodas and any drinks with added sugar. Sweets and desserts Frosting. Pudding. Cookies. Cakes. Pies.  Milk chocolate or white chocolate. Buttered syrups. Full-fat ice cream or ice cream drinks. The items listed above may not be a complete list of foods and beverages to avoid. Contact a dietitian for more information. Summary  Heart-healthy meal planning includes limiting unhealthy fats, increasing healthy fats, and making other diet and lifestyle changes.  Lose weight if you are overweight. Losing just 5-10% of your body weight can help your overall health and prevent diseases such as diabetes and heart disease.  Focus on eating a balance of foods, including fruits and vegetables, low-fat or nonfat dairy, lean protein, nuts and legumes, whole grains, and heart-healthy oils and fats. This information is not intended to replace advice given to you by your health care provider. Make sure you discuss any questions you have with your health care provider. Document Revised: 10/25/2017 Document Reviewed: 10/25/2017 Elsevier Patient Education  2021 [employer].

## 2020-11-29 ENCOUNTER — Other Ambulatory Visit: Payer: Self-pay

## 2020-11-29 ENCOUNTER — Ambulatory Visit: Payer: Self-pay | Admitting: Licensed Clinical Social Worker

## 2020-11-29 DIAGNOSIS — F4323 Adjustment disorder with mixed anxiety and depressed mood: Secondary | ICD-10-CM

## 2020-11-29 NOTE — BH Specialist Note (Signed)
Integrated Behavioral Health Follow Up In-Person Visit  MRN: [id number] Name: Natalie Frey   Total time: 60 minutes  Types of Service: [place] ([facility])  Interpretor:No. Interpretor Name and Language:   Patient consents to telephone visit and 2 patient identifiers were used to identify patient  Subjective: Natalie Frey is a 65 y.o. female accompanied by herself  Patient was referred by Carlyon Shadow, NP  for mental health Patient reports the following symptoms/concerns: The patient notes that her ride home from work was nice because the weather is warming up. She stated she is ready for the cold days to be over so she can quit warming her car up and because she does better in warmer weather. She went to her appointment at [facility] and was hoping she would be able to get reconstructive surgery to correct issues that cause her to urinate frequently. She noted that she has to always remain near a restroom which limits her ability to travel and participate in activities she enjoys. The patient reports she is somewhat frustrated that she has to wait until late May or into June for the scans and other pre-op steps due to the [facility] clinic moving. The patient discussed her relationship and described her boyfriend as supportive. She notes that everything has been quiet in her life and overall she feels she is doing well. She stated that she is taking her medications as prescribed. She discussed work related stressors. The patient denied suicidal or homicidal thoughts.   Duration of problem: ; Severity of problem: moderate  Objective: Mood: Euthymic and Affect: Appropriate Risk of harm to self or others: No plan to harm self or others  Life Context: Family and Social: see above School/Work: see above Self-Care: see above Life Changes: see above   Patient and/or Family's Strengths/Protective Factors: Concrete supports in place (healthy food, safe environments, etc.)  Goals  Addressed: Patient will: 1.  Reduce symptoms of: anxiety, depression and stress  2.  Increase knowledge and/or ability of: coping skills, healthy habits and stress reduction  3.  Demonstrate ability to: Increase healthy adjustment to current life circumstances  Progress towards Goals: Ongoing  Interventions: Interventions utilized:  Supportive Counseling was utilized by the clinician during today's follow up session. The clinician processed with the patient how she has been doing since the last follow-up session. The clinician provided a space for the patient to ventilate their frustrations regarding their current life circumstances. Clinician measured the patient's anxiety and depression on a numerical scale. Clinician congratulated the patient for utilizing her coping skills to deal with work related stressors and for doing so well overall. Clinician encouraged the patient to continue to utilize her coping skills and practice self care.  Standardized Assessments completed: GAD-7 and PHQ 9  GAD-7    5 PHQ-9    3  Assessment: Patient currently experiencing see above  Patient may benefit from see above  Plan: 1. Follow up with behavioral health clinician on : 01/03/2021 at 3:00 PM  2. Behavioral recommendations:  3. Referral(s): [facility] 4. "From scale of 1-10, how likely are you to follow plan?":   Lesli Albee, Student-Social Work

## 2020-12-07 ENCOUNTER — Telehealth: Payer: Self-pay | Admitting: Pharmacist

## 2020-12-07 NOTE — Telephone Encounter (Signed)
Patient failed to provide requested 2022 financial documentation. Unable to determine patient's eligibility status. No additional medication assistance will be provided by [facility] without the required proof of income documentation. Patient notified by letter.  [place]

## 2020-12-16 ENCOUNTER — Telehealth: Payer: Self-pay | Admitting: Pharmacist

## 2020-12-16 NOTE — Telephone Encounter (Signed)
Patient still needs to provide 2021 Federal Tax Return. Failure to provide requested financial documentation by 01/29/21 will cause a halt in medication assistance.   [place]

## 2021-01-02 ENCOUNTER — Other Ambulatory Visit: Payer: Self-pay

## 2021-01-02 MED FILL — Metformin HCl Tab 500 MG: ORAL | 90 days supply | Qty: 90 | Fill #0 | Status: AC

## 2021-01-03 ENCOUNTER — Other Ambulatory Visit: Payer: Self-pay

## 2021-01-03 ENCOUNTER — Ambulatory Visit: Payer: Self-pay | Admitting: Licensed Clinical Social Worker

## 2021-01-03 ENCOUNTER — Other Ambulatory Visit: Payer: Self-pay | Admitting: Gerontology

## 2021-01-03 DIAGNOSIS — F4323 Adjustment disorder with mixed anxiety and depressed mood: Secondary | ICD-10-CM

## 2021-01-03 DIAGNOSIS — F418 Other specified anxiety disorders: Secondary | ICD-10-CM

## 2021-01-03 MED ORDER — MIRTAZAPINE 15 MG PO TABS
ORAL_TABLET | ORAL | 2 refills | Status: DC
  Filled 2021-01-03: qty 45, 30d supply, fill #0
  Filled 2021-02-11: qty 45, 30d supply, fill #1
  Filled 2021-03-12: qty 45, 30d supply, fill #2

## 2021-01-03 NOTE — BH Specialist Note (Signed)
Integrated Behavioral Health Follow Up In-Person Visit  MRN: [id number] Name: Natalie Frey   Total time: 60 minutes  Types of Service: [place] ([facility])  Interpretor:No. Interpretor Name and Language:   Patient consents to telephone visit and 2 patient identifiers were used to identify patient    Subjective: Natalie Frey is a 65 y.o. female accompanied by herself  Patient was referred by Carlyon Shadow, NP  for mental health. Patient reports the following symptoms/concerns: The patient reports that she has been about the same since her last follow-up session. 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 She noted that her line backes up resulting in more frustrated customers for her to deal with. She explained that business at her job has been slow and they have cut her hours. She hopes when the new schedule comes out she will have a later start time. She discussed frustrations with trying to get her prescriptions from [facility], but notes that she did get her paperwork turned in and will not have to do it again for one year. She notes that she went fishing but did not catch anything. She looks forward to the warmer weather and getting out more and going on more adventures. The patient denied any suicidal or homicidal thoughts.   Duration of problem: ; Severity of problem: moderate  Objective: Mood: Euthymic and Affect: Appropriate Risk of harm to self or others: No plan to harm self or others  Life Context: Family and Social: see above School/Work: see above Self-Care: see above Life Changes: see above  Patient and/or Family's Strengths/Protective Factors: Concrete supports in place (healthy food, safe environments, etc.)  Goals Addressed: Patient will: 1.  Reduce symptoms of: anxiety and depression  2.  Increase knowledge and/or ability of: coping skills, healthy habits and self-management  skills  3.  Demonstrate ability to: Increase healthy adjustment to current life circumstances  Progress towards Goals: Ongoing  Interventions: Interventions utilized:  CBT Cognitive Behavioral Therapy was utilized by the clinician during today's follow up session. The clinician processed with the patient how they have been doing since the last follow-up session. The clinician provided a space for the patient to ventilate their frustrations regarding their current life circumstances. Clinician measured the patient's anxiety and depression on a numerical scale. Clinician congratulated the patient for doing so well. Clinician encouraged the client to continue to utilize self care and her coping skills to deal with her current life circumstances.Clinician discussed with the patient her upcoming changeover to an LCSW-A  and her role in the clinic. Clinician discussed the plan for bridging the patients care during the clinician's upcoming transition from student to LCSW-A in the clinic. Clinician offered to send an e-mail to the patient with an attached [place] flyer that includes the walk- in hours and the crisis line number. Clinician explained that should the patient experience a crisis to utilize [facility] or go to the closest emergency department for help.  Standardized Assessments completed: GAD-7 and PHQ 9  GAD-7     9 PHQ-9     4   Assessment: Patient currently experiencing see above    Patient may benefit from see above   Plan: 1. Follow up with behavioral health clinician on : Will Call to schedule May 2022 2. Behavioral recommendations:  3. Referral(s): [place] (In Clinic) 4. "From scale of 1-10, how likely are you to follow plan?":  Lesli Albee, Student-Social Work

## 2021-01-04 ENCOUNTER — Other Ambulatory Visit: Payer: Self-pay

## 2021-02-13 ENCOUNTER — Other Ambulatory Visit: Payer: Self-pay

## 2021-02-17 ENCOUNTER — Other Ambulatory Visit: Payer: Self-pay

## 2021-02-19 MED FILL — Atorvastatin Calcium Tab 80 MG (Base Equivalent): ORAL | 90 days supply | Qty: 90 | Fill #0 | Status: AC

## 2021-02-20 ENCOUNTER — Other Ambulatory Visit: Payer: Self-pay

## 2021-02-23 ENCOUNTER — Encounter: Payer: Self-pay | Admitting: Gerontology

## 2021-02-23 ENCOUNTER — Other Ambulatory Visit: Payer: Self-pay

## 2021-02-23 ENCOUNTER — Ambulatory Visit: Payer: Self-pay | Admitting: Gerontology

## 2021-02-23 VITALS — BP 124/74 | HR 72 | Temp 97.4°F | Resp 16 | Ht 64.0 in | Wt 177.7 lb

## 2021-02-23 DIAGNOSIS — I739 Peripheral vascular disease, unspecified: Secondary | ICD-10-CM

## 2021-02-23 DIAGNOSIS — M67439 Ganglion, unspecified wrist: Secondary | ICD-10-CM | POA: Insufficient documentation

## 2021-02-23 DIAGNOSIS — M67432 Ganglion, left wrist: Secondary | ICD-10-CM

## 2021-02-23 DIAGNOSIS — E782 Mixed hyperlipidemia: Secondary | ICD-10-CM

## 2021-02-23 MED ORDER — ATORVASTATIN CALCIUM 80 MG PO TABS
ORAL_TABLET | ORAL | 1 refills | Status: DC
  Filled 2021-02-23: qty 90, fill #0
  Filled 2021-05-21: qty 90, 90d supply, fill #0
  Filled 2021-08-20: qty 90, 90d supply, fill #1

## 2021-02-23 MED ORDER — ASPIRIN EC 81 MG PO TBEC
81.0000 mg | DELAYED_RELEASE_TABLET | Freq: Every day | ORAL | 3 refills | Status: DC
  Filled 2021-02-23: qty 30, 30d supply, fill #0
  Filled 2021-04-02: qty 30, 30d supply, fill #1
  Filled 2021-05-07: qty 30, 30d supply, fill #2
  Filled 2021-06-04: qty 30, 30d supply, fill #3

## 2021-02-23 NOTE — Progress Notes (Signed)
Established Patient Office Visit  Subjective:  Patient ID: Natalie Frey, female    DOB: [birth year]  Age: 65 y.o. MRN: [id number]  CC:  Chief Complaint  Patient presents with  . Follow-up  . Diabetes  . Leg Swelling    X 6 months L>R  . Wrist Pain    Patient states she has a "bump" on her left wrist x 2 months. Patient states it is sometimes painful.    HPI Natalie Frey is a 65 y/o female with PMH of T2DM, DVT femoral vein, Hyperlipidemia, Stroke presents for c/o claudication to left lower leg and cyst to left wrist. She c/o bilateral lower extremity swelling that has been going on for 6 months. She states that she experiences intermittent claudication and tightness to left lower leg especially when at work. 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 She reports wearing compression stockings and comfortable walking shoe. She states that bilateral leg swelling resolves with elevation. She denies erythema to left calf. 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 She admits that cyst has been present for many months , but it's not bothersome. She was seen at [facility] on 11/25/20 by Dr Charma Igo for incomplete uterovaginal prolapse. She states that she will be scheduled for prolapse repair. Overall, she states that she's doing well and offers no further complaint.  Past Medical History:  Diagnosis Date  . Cancer ([place])    bladder (10 years ago, per patient)  . Diabetes mellitus without complication ([place])    type 2  . Dvt femoral (deep venous thrombosis) ([place])   . Hyperlipidemia   . Stroke [facility])    July 2020, per patient    Past Surgical History:  Procedure Laterality Date  . BLADDER SURGERY    . COLONOSCOPY WITH PROPOFOL N/A 02/11/2020   Procedure: COLONOSCOPY WITH PROPOFOL;  Surgeon: Jonathon Bellows, MD;  Location: [facility] ENDOSCOPY;   Service: Gastroenterology;  Laterality: N/A;  . DILATION AND EVACUATION      Family History  Problem Relation Age of Onset  . Deep vein thrombosis Daughter         when pregnant   . Pulmonary embolism Sister        while in hospital; other sister- on BCPs.   . Breast cancer Sister 7  . Diabetes Mother   . Heart disease Mother   . Stroke Mother   . Aneurysm Brother        brain  . Stroke Brother   . Other Brother        unknown medical history  . Hyperlipidemia Sister   . Cancer Sister   . Mesothelioma Father     Social History   Socioeconomic History  . Marital status: Widowed    Spouse name: Not on file  . Number of children: 2  . Years of education: Not on file  . Highest education level: High school graduate  Occupational History  . Not on file  Tobacco Use  . Smoking status: Former Smoker    Packs/day: 0.25    Years: 47.00    Pack years: 11.75    Types: Cigarettes    Quit date: 02/09/2021    Years since quitting: 0.0  . Smokeless tobacco: Never Used  . Tobacco comment: 1 pack in 4 days; was able  to quit for awhile  Vaping Use  . Vaping Use: Never used  Substance and Sexual Activity  . Alcohol use: Yes    Comment: rare  . Drug use: Not Currently    Types: Marijuana, Cocaine    Comment: last use 11/14/20 (marijuana)  . Sexual activity: Not Currently  Other Topics Concern  . Not on file  Social History Narrative   [place]; smoking; 1ppd/cutting down. Social. Worked for [employer] from [place].       Said she has been doing ok and does not need any assistance          - is currently on food stamps but those have been enough       NO consent form signed    Social Determinants of Radio broadcast assistant Strain: Not on file  Food Insecurity: No Food Insecurity  . Worried About Charity fundraiser in the Last Year: Never true  . Ran Out of Food in the Last Year: Never true  Transportation Needs: No Transportation Needs  . Lack of  Transportation (Medical): No  . Lack of Transportation (Non-Medical): No  Physical Activity: Not on file  Stress: Not on file  Social Connections: Not on file  Intimate Partner Violence: Not on file    Outpatient Medications Prior to Visit  Medication Sig Dispense Refill  . acetaminophen (TYLENOL) 325 MG tablet Take 2 tablets (650 mg total) by mouth every 6 (six) hours as needed for mild pain (or Fever >/= 101).    . metFORMIN (GLUCOPHAGE) 500 MG tablet TAKE 1 TABLET BY MOUTH DAILY WITH BREAKFAST 90 tablet 1  . mirtazapine (REMERON) 15 MG tablet TAKE 1 1/2 TABLETS (22.5MG ) BY MOUTH AT BEDTIME 45 tablet 2  . nicotine (NICODERM CQ - DOSED IN MG/24 HOURS) 21 mg/24hr patch Place 1 patch (21 mg total) onto the skin daily. 28 patch 1  . aspirin EC 81 MG tablet Take 81 mg by mouth daily.    Marland Kitchen atorvastatin (LIPITOR) 80 MG tablet TAKE ONE TABLET BY MOUTH EVERY DAY AT 6:00PM 90 tablet 1   No facility-administered medications prior to visit.    No Known Allergies  ROS Review of Systems  Constitutional: Negative.   Respiratory: Negative.   Cardiovascular: Negative.   Musculoskeletal: Positive for arthralgias (cyst to left wrist).  Skin: Negative.   Neurological: Negative.   Psychiatric/Behavioral: Negative.       Objective:    Physical Exam HENT:     Head: Normocephalic and atraumatic.  Cardiovascular:     Rate and Rhythm: Normal rate and regular rhythm.     Pulses: Normal pulses.     Heart sounds: Normal heart sounds.  Pulmonary:     Effort: Pulmonary effort is normal.     Breath sounds: Normal breath sounds.  Musculoskeletal:        General: No swelling or tenderness. Normal range of motion.     Left wrist: No tenderness. Normal range of motion.     Comments: Pea sized cyst to lateral aspect of left wrist.  Skin:    General: Skin is warm.  Neurological:     General: No focal deficit present.     Mental Status: She is alert and oriented to person, place, and time. Mental  status is at baseline.  Psychiatric:        Mood and Affect: Mood normal.        Behavior: Behavior normal.  Thought Content: Thought content normal.        Judgment: Judgment normal.     BP 124/74 (BP Location: Right Arm, Patient Position: Sitting, Cuff Size: Large)   Pulse 72   Temp (!) 97.4 F (36.3 C)   Resp 16   Ht 5\' 4"  (1.626 m)   Wt 177 lb 11.2 oz (80.6 kg)   SpO2 97%   BMI 30.50 kg/m  Wt Readings from Last 3 Encounters:  02/23/21 177 lb 11.2 oz (80.6 kg)  11/22/20 170 lb 9.6 oz (77.4 kg)  07/04/20 164 lb (74.4 kg)   Encouraged weight loss  Health Maintenance Due  Topic Date Due  . Hepatitis C Screening  Never done  . TETANUS/TDAP  Never done  . Zoster Vaccines- Shingrix (1 of 2) Never done  . COVID-19 Vaccine (3 - Booster for Pfizer series) 06/06/2020    There are no preventive care reminders to display for this patient.  Lab Results  Component Value Date   TSH 1.840 04/17/2018   Lab Results  Component Value Date   WBC 8.0 06/24/2019   HGB 15.7 06/24/2019   HCT 45.7 06/24/2019   MCV 93 06/24/2019   PLT 255 06/24/2019   Lab Results  Component Value Date   NA 141 06/02/2020   K 3.9 06/02/2020   CO2 22 06/02/2020   GLUCOSE 124 (H) 06/02/2020   BUN 12 06/02/2020   CREATININE 0.75 06/02/2020   BILITOT 0.8 06/02/2020   ALKPHOS 72 06/02/2020   AST 17 06/02/2020   ALT 18 06/02/2020   PROT 7.0 06/02/2020   ALBUMIN 4.3 06/02/2020   CALCIUM 9.1 06/02/2020   ANIONGAP 10 04/27/2019   Lab Results  Component Value Date   CHOL 137 11/03/2020   Lab Results  Component Value Date   HDL 44 11/03/2020   Lab Results  Component Value Date   LDLCALC 75 11/03/2020   Lab Results  Component Value Date   TRIG 99 11/03/2020   Lab Results  Component Value Date   CHOLHDL 3.1 11/03/2020   Lab Results  Component Value Date   HGBA1C 6.1 (H) 11/03/2020      Assessment & Plan:     1. Mixed hyperlipidemia -She will continue on current  medication, low fat/cholesterol diet and exercise as tolerated. - atorvastatin (LIPITOR) 80 MG tablet; TAKE ONE TABLET BY MOUTH EVERY DAY AT 6:00PM  Dispense: 90 tablet; Refill: 1 - aspirin EC 81 MG tablet; Take 1 tablet (81 mg total) by mouth daily.  Dispense: 30 tablet; Refill: 3  2. Intermittent claudication ([place]) - She was advised to wear compression stockings and comfortable walking shoe while at work. She is to go to the ED with worsening pain, erythema and tightness. - Provided Radiology flyer to schedule appointment for US Venous Img Lower Unilateral Left; Future  3. Ganglion of left wrist - She was advised to notify clinic for worsening symptoms.   Follow-up: Return in about 2 months (around 04/25/2021), or if symptoms worsen or fail to improve.    Breya Cass Jerold [place]om[place], NP

## 2021-02-23 NOTE — Patient Instructions (Signed)
Prediabetes Eating Plan Prediabetes is a condition that causes blood sugar (glucose) levels to be higher than normal. This increases the risk for developing type 2 diabetes (type 2 diabetes mellitus). Working with a health care provider or nutrition specialist (dietitian) to make diet and lifestyle changes can help prevent the onset of diabetes. These changes may help you:  Control your blood glucose levels.  Improve your cholesterol levels.  Manage your blood pressure. What are tips for following this plan? Reading food labels  Read food labels to check the amount of fat, salt (sodium), and sugar in prepackaged foods. Avoid foods that have: ? Saturated fats. ? Trans fats. ? Added sugars.  Avoid foods that have more than 300 milligrams (mg) of sodium per serving. Limit your sodium intake to less than 2,300 mg each day. Shopping  Avoid buying pre-made and processed foods.  Avoid buying drinks with added sugar. Cooking  Cook with olive oil. Do not use butter, lard, or ghee.  Bake, broil, grill, steam, or boil foods. Avoid frying. Meal planning  Work with your dietitian to create an eating plan that is right for you. This may include tracking how many calories you take in each day. Use a food diary, notebook, or mobile application to track what you eat at each meal.  Consider following a Mediterranean diet. This includes: ? Eating several servings of fresh fruits and vegetables each day. ? Eating fish at least twice a week. ? Eating one serving each day of whole grains, beans, nuts, and seeds. ? Using olive oil instead of other fats. ? Limiting alcohol. ? Limiting red meat. ? Using nonfat or low-fat dairy products.  Consider following a plant-based diet. This includes dietary choices that focus on eating mostly vegetables and fruit, grains, beans, nuts, and seeds.  If you have high blood pressure, you may need to limit your sodium intake or follow a diet such as the DASH  (Dietary Approaches to Stop Hypertension) eating plan. The DASH diet aims to lower high blood pressure.   Lifestyle  Set weight loss goals with help from your health care team. It is recommended that most people with prediabetes lose 7% of their body weight.  Exercise for at least 30 minutes 5 or more days a week.  Attend a support group or seek support from a mental health counselor.  Take over-the-counter and prescription medicines only as told by your health care provider. What foods are recommended? Fruits Berries. Bananas. Apples. Oranges. Grapes. Papaya. Mango. Pomegranate. Kiwi. Grapefruit. Cherries. Vegetables Lettuce. Spinach. Peas. Beets. Cauliflower. Cabbage. Broccoli. Carrots. Tomatoes. Squash. Eggplant. Herbs. Peppers. Onions. Cucumbers. Brussels sprouts. Grains Whole grains, such as whole-wheat or whole-grain breads, crackers, cereals, and pasta. Unsweetened oatmeal. Bulgur. Barley. Quinoa. Brown rice. Corn or whole-wheat flour tortillas or taco shells. Meats and other proteins Seafood. Poultry without skin. Lean cuts of pork and beef. Tofu. Eggs. Nuts. Beans. Dairy Low-fat or fat-free dairy products, such as yogurt, cottage cheese, and cheese. Beverages Water. Tea. Coffee. Sugar-free or diet soda. Seltzer water. Low-fat or nonfat milk. Milk alternatives, such as soy or almond milk. Fats and oils Olive oil. Canola oil. Sunflower oil. Grapeseed oil. Avocado. Walnuts. Sweets and desserts Sugar-free or low-fat pudding. Sugar-free or low-fat ice cream and other frozen treats. Seasonings and condiments Herbs. Sodium-free spices. Mustard. Relish. Low-salt, low-sugar ketchup. Low-salt, low-sugar barbecue sauce. Low-fat or fat-free mayonnaise. The items listed above may not be a complete list of recommended foods and beverages. Contact a dietitian for more   information. What foods are not recommended? Fruits Fruits canned with syrup. Vegetables Canned vegetables. Frozen  vegetables with butter or cream sauce. Grains Refined white flour and flour products, such as bread, pasta, snack foods, and cereals. Meats and other proteins Fatty cuts of meat. Poultry with skin. Breaded or fried meat. Processed meats. Dairy Full-fat yogurt, cheese, or milk. Beverages Sweetened drinks, such as iced tea and soda. Fats and oils Butter. Lard. Ghee. Sweets and desserts Baked goods, such as cake, cupcakes, pastries, cookies, and cheesecake. Seasonings and condiments Spice mixes with added salt. Ketchup. Barbecue sauce. Mayonnaise. The items listed above may not be a complete list of foods and beverages that are not recommended. Contact a dietitian for more information. Where to find more information  American Diabetes Association: www.diabetes.org Summary  You may need to make diet and lifestyle changes to help prevent the onset of diabetes. These changes can help you control blood sugar, improve cholesterol levels, and manage blood pressure.  Set weight loss goals with help from your health care team. It is recommended that most people with prediabetes lose 7% of their body weight.  Consider following a Mediterranean diet. This includes eating plenty of fresh fruits and vegetables, whole grains, beans, nuts, seeds, fish, and low-fat dairy, and using olive oil instead of other fats. This information is not intended to replace advice given to you by your health care provider. Make sure you discuss any questions you have with your health care provider. Document Revised: 12/17/2019 Document Reviewed: 12/17/2019 Elsevier Patient Education  2021 Elsevier Inc.  

## 2021-02-24 ENCOUNTER — Other Ambulatory Visit: Payer: Self-pay

## 2021-02-28 ENCOUNTER — Ambulatory Visit
Admission: RE | Admit: 2021-02-28 | Discharge: 2021-02-28 | Disposition: A | Discharge status: Home / Self Care | Payer: Self-pay | Source: Ambulatory Visit | Attending: Gerontology | Admitting: Gerontology

## 2021-02-28 ENCOUNTER — Other Ambulatory Visit: Payer: Self-pay

## 2021-02-28 ENCOUNTER — Other Ambulatory Visit: Payer: Self-pay | Admitting: Gerontology

## 2021-02-28 DIAGNOSIS — I739 Peripheral vascular disease, unspecified: Secondary | ICD-10-CM | POA: Insufficient documentation

## 2021-02-28 DIAGNOSIS — Z86718 Personal history of other venous thrombosis and embolism: Secondary | ICD-10-CM

## 2021-03-13 ENCOUNTER — Other Ambulatory Visit: Payer: Self-pay

## 2021-03-16 ENCOUNTER — Other Ambulatory Visit: Payer: Self-pay

## 2021-03-16 ENCOUNTER — Ambulatory Visit: Payer: Self-pay | Admitting: Licensed Clinical Social Worker

## 2021-03-16 DIAGNOSIS — F4323 Adjustment disorder with mixed anxiety and depressed mood: Secondary | ICD-10-CM

## 2021-03-16 NOTE — BH Specialist Note (Signed)
Integrated Behavioral Health Follow Up In-Person Visit  MRN: [id number] Name: Natalie Frey   Total time: 30 minutes  Types of Service: Telephone visit  Interpretor:No. Interpretor Name and Language:   Subjective: Natalie Frey is a 65 y.o. female accompanied by Mother Patient was referred by Carlyon Shadow, NP for mental health. Patient reports the following symptoms/concerns: The patient reports that she has been doing very well since her last follow-up visit. She notes that although snakes are not her thing she was able to attend a reptile festival with her boyfriend and enjoyed the day out together. She stated that she is sleeping well and eating regularly. She discussed work stressors, but otherwise feels everything is going in the right direction with her life. She stated that she is content to work shorter hours and feels that as long as she can pay her bills she is okay. She discussed health related stressors. She is looking forward to going to a kid [employer] concert this weekend. She offered no other complaint. The patient denied any suicidal or homicidal thoughts.  Severity of problem: moderate  Objective: Mood: Euthymic and Affect: Appropriate Risk of harm to self or others: No plan to harm self or others  Life Context: Family and Social: see above School/Work: see above Self-Care: see above Life Changes: see above  Patient and/or Family's Strengths/Protective Factors: Concrete supports in place (healthy food, safe environments, etc.)  Goals Addressed: Patient will:  Reduce symptoms of: agitation, anxiety, and depression   Increase knowledge and/or ability of: coping skills, healthy habits, and self-management skills   Demonstrate ability to: Increase healthy adjustment to current life circumstances  Progress towards Goals: Ongoing  Interventions: Interventions utilized:  Supportive Counselingwas utilized by the clinician during today's follow up session.  The clinician processed with the patient how they have been doing since the last follow-up session. The clinician utilized reflective listening to allow space for the patient to  ventilate their frustrations regarding their current life circumstances. Clinician measured the patient's anxiety and depression on a numerical scale.  Clinician congratulated the patient for doing so well and on utilizing coping skills to deal with work related stress.  Standardized Assessments completed: GAD-7 and PHQ 9 PHQ-9    2 GAD-7    2  Assessment: Patient currently experiencing see above.   Patient may benefit from see above.  Plan: Follow up with behavioral health clinician on : 04/04/2021 at 4:00 PM Behavioral recommendations: Referral(s): [place] (In Clinic) "From scale of 1-10, how likely are you to follow plan?":   Lesli Albee, LCSWA

## 2021-04-02 ENCOUNTER — Other Ambulatory Visit: Payer: Self-pay | Admitting: Gerontology

## 2021-04-02 DIAGNOSIS — F418 Other specified anxiety disorders: Secondary | ICD-10-CM

## 2021-04-02 MED FILL — Metformin HCl Tab 500 MG: ORAL | 90 days supply | Qty: 90 | Fill #1 | Status: AC

## 2021-04-03 MED ORDER — MIRTAZAPINE 15 MG PO TABS
ORAL_TABLET | ORAL | 2 refills | Status: DC
  Filled 2021-04-03: qty 45, fill #0
  Filled 2021-04-13: qty 45, 30d supply, fill #0
  Filled 2021-05-14: qty 45, 30d supply, fill #1
  Filled 2021-06-11: qty 45, 30d supply, fill #2

## 2021-04-04 ENCOUNTER — Ambulatory Visit: Payer: Self-pay | Admitting: Licensed Clinical Social Worker

## 2021-04-04 ENCOUNTER — Other Ambulatory Visit: Payer: Self-pay

## 2021-04-04 DIAGNOSIS — F4323 Adjustment disorder with mixed anxiety and depressed mood: Secondary | ICD-10-CM

## 2021-04-04 NOTE — BH Specialist Note (Signed)
Integrated Behavioral Health Follow Up In-Person Visit  MRN: [id number] Name: Natalie Frey   Total time: 60 minutes  Types of Service: Telephone visit Patient consents to telephone visit and 2 patient identifiers were used to identify patient   Interpretor:No. Interpretor Name and Language:   Subjective: Natalie Frey is a 65 y.o. female accompanied by herself Patient was referred by Carlyon Shadow, NP for Mental Health. Patient reports the following symptoms/concerns: The patient reports she is doing well since her last visit. She notes that she had a great time at the [facility] concert she went to with her boyfriend. She discussed fireworks and how the experience has not been the same for her in the last few years. She noted she is not sure if it is because the firework displays are not as big as they use to be due to cost or if the magic of the experience has wore off. She discussed work related stressors and noted that she has made progress with advocating for the hours she preferred to work. The patient reported she has an appointment to see a surgeon and is feeling nervous about having to be put under anesthesia. She feels like her boyfriend does not understand her concerns and brushes her feelings away. She reported that she is trying to avoid thinking about it, but it pops up in her mind from time to time and she pushes it away. She state that she knows she will feel better once the procedure is over. The patient explained that everything else in her life has been going very well. She reports she is taking her psychotropic medication exactly as prescribed at the same time everyday. She states that she is looking forward to more adventures in her future. The patient denied any suicidal or homicidal thoughts.   Duration of problem: Years; Severity of problem: mild  Objective: Mood: Euthymic and Affect: Appropriate Risk of harm to self or others: No plan to harm self or others  Life  Context: Family and Social: see above School/Work: see above Self-Care: see above Life Changes: see above  Patient and/or Family's Strengths/Protective Factors: Social connections, Concrete supports in place (healthy food, safe environments, etc.), and Sense of purpose  Goals Addressed: Patient will:  Reduce symptoms of: anxiety, depression, and stress   Increase knowledge and/or ability of: coping skills, healthy habits, self-management skills, and stress reduction   Demonstrate ability to: Increase healthy adjustment to current life circumstances  Progress towards Goals: Ongoing  Interventions: Interventions utilized:  Supportive Counseling was utilized by the clinician during today's follow up session. The clinician processed with the patient how they have been doing since the last follow-up session. The clinician provided a space for the patient to ventilate their frustrations regarding their current life circumstances. Clinician measured the patient's anxiety and depression on a numerical scale. Clinician encouraged the patient to focus on the positives verses the negatives in her life. Clinician explored with the patient the ways she is incorporating self care into her daily routine and congratulated her for her progress in this area. Clinician validated and normalized the patients concerns and feelings of fear regarding an upcoming surgery. The clinician encouraged the patient to share her feelings in therapy, with her significant other, and her doctors.  Standardized Assessments completed: GAD-7 and PHQ 9 PHQ-9     3 GAD-7     2  Assessment: Patient currently experiencing see above.   Patient may benefit from see above.  Plan: Follow up  with behavioral health clinician on : 05/25/2021 at 4:00 PM  Behavioral recommendations:  Referral(s): [place] (In Clinic) "From scale of 1-10, how likely are you to follow plan?":   Lesli Albee,  LCSWA

## 2021-04-13 ENCOUNTER — Other Ambulatory Visit: Payer: Self-pay

## 2021-04-25 ENCOUNTER — Encounter: Payer: Self-pay | Admitting: Gerontology

## 2021-04-25 ENCOUNTER — Other Ambulatory Visit: Payer: Self-pay

## 2021-04-25 ENCOUNTER — Ambulatory Visit: Payer: Self-pay | Admitting: Gerontology

## 2021-04-25 VITALS — BP 128/76 | HR 68 | Temp 97.9°F | Resp 16 | Ht 64.0 in | Wt 183.6 lb

## 2021-04-25 DIAGNOSIS — R7303 Prediabetes: Secondary | ICD-10-CM

## 2021-04-25 DIAGNOSIS — D229 Melanocytic nevi, unspecified: Secondary | ICD-10-CM | POA: Insufficient documentation

## 2021-04-25 DIAGNOSIS — R202 Paresthesia of skin: Secondary | ICD-10-CM | POA: Insufficient documentation

## 2021-04-25 MED ORDER — GABAPENTIN 100 MG PO CAPS
100.0000 mg | ORAL_CAPSULE | Freq: Every day | ORAL | 0 refills | Status: DC
  Filled 2021-04-25: qty 30, 30d supply, fill #0

## 2021-04-25 MED ORDER — METFORMIN HCL 500 MG PO TABS
ORAL_TABLET | Freq: Every day | ORAL | 1 refills | Status: DC
  Filled 2021-04-25: qty 90, fill #0
  Filled 2021-07-02: qty 90, 90d supply, fill #0
  Filled 2021-09-26: qty 90, 90d supply, fill #1

## 2021-04-25 NOTE — Progress Notes (Signed)
Established Patient Office Visit  Subjective:  Patient ID: Natalie Frey, female    DOB: 07-18-1956  Age: 65 y.o. MRN: [id number]  CC:  Chief Complaint  Patient presents with   Follow-up   Diabetes    Patient is checking her blood sugars and they are running in the 100s. Patient states she is taking her medications as prescribed.    HPI Natalie Frey is a 65 y/o female with PMH of T2DM, DVT femoral vein, Hyperlipidemia, Stroke presents for routine follow up and medication refill. She had Ultra sound to left lower extremity done on  02/28/21 and it showed No evidence of acute left lower extremity deep venous thrombosis. 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 She reports that she continues to experience intermittent pain to  her left leg. She states that pain originates from her left lower back and radiates to her left leg. She states that pain worsens with standing longer hours at work. She works 5-7 hours at [employer] standing and walking . She  also states that  her leg pain is associated with intermittent paresthesia and tingling to her left leg. She states that sitting down for a long period aggravates symptoms. She wears compression stockings with [employer]chers walking shoes while at work.  She takes Tylenol with moderate relief, and she denies muscle nor motor weakness. She also reports that the color to the  2 moles to her abdomen changed from dark to a lighter color and the surface is rough. She denies tenderness when touched. Overall, she states that she's doing well and offers no further complaint.  Past Medical History:  Diagnosis Date   Cancer ([place])    bladder (10 years ago, per patient)   Diabetes mellitus without complication ([place])    type 2   Dvt femoral (deep venous thrombosis) ([place])    Hyperlipidemia    Stroke [facility])    July 2020, per patient    Past Surgical History:  Procedure Laterality Date   BLADDER SURGERY     COLONOSCOPY WITH  PROPOFOL N/A 02/11/2020   Procedure: COLONOSCOPY WITH PROPOFOL;  Surgeon: Jonathon Bellows, MD;  Location: [facility] ENDOSCOPY;  Service: Gastroenterology;  Laterality: N/A;   DILATION AND EVACUATION      Family History  Problem Relation Age of Onset   Deep vein thrombosis Daughter         when pregnant    Pulmonary embolism Sister        while in hospital; other sister- on BCPs.    Breast cancer Sister 79   Diabetes Mother    Heart disease Mother    Stroke Mother    Aneurysm Brother        brain   Stroke Brother    Other Brother        unknown medical history   Hyperlipidemia Sister    Cancer Sister    Mesothelioma Father     Social History   Socioeconomic History   Marital status: Widowed    Spouse name: Not on file   Number of children: 2   Years of education: Not on file   Highest education level: High school graduate  Occupational History   Not on file  Tobacco Use   Smoking status: Former    Packs/day: 0.25    Years: 47.00    Pack years: 11.75    Types: Cigarettes    Quit date: 02/09/2021    Years since quitting: 0.2  Smokeless tobacco: Never   Tobacco comments:    1 pack in 4 days; was able to quit for awhile. Patient stills has an occasional cigarette and uses the Nicoderm patches prn  Vaping Use   Vaping Use: Never used  Substance and Sexual Activity   Alcohol use: Yes    Comment: rare   Drug use: Not Currently    Types: Marijuana, Cocaine    Comment: last use 11/14/20 (marijuana)   Sexual activity: Not Currently  Other Topics Concern   Not on file  Social History Narrative   [place]; smoking; 1ppd/cutting down. Social. Worked for [employer] from [place].       Said she has been doing ok and does not need any assistance          - is currently on food stamps but those have been enough       NO consent form signed    Social Determinants of Radio broadcast assistant Strain: Not on file  Food Insecurity: No Food Insecurity   Worried About  Charity fundraiser in the Last Year: Never true   Ran Out of Food in the Last Year: Never true  Transportation Needs: No Transportation Needs   Lack of Transportation (Medical): No   Lack of Transportation (Non-Medical): No  Physical Activity: Not on file  Stress: Not on file  Social Connections: Not on file  Intimate Partner Violence: Not on file    Outpatient Medications Prior to Visit  Medication Sig Dispense Refill   acetaminophen (TYLENOL) 325 MG tablet Take 2 tablets (650 mg total) by mouth every 6 (six) hours as needed for mild pain (or Fever >/= 101).     aspirin EC 81 MG tablet Take 1 tablet (81 mg total) by mouth daily. 30 tablet 3   atorvastatin (LIPITOR) 80 MG tablet TAKE ONE TABLET BY MOUTH EVERY DAY AT 6:00PM 90 tablet 1   mirtazapine (REMERON) 15 MG tablet TAKE 1 1/2 TABLETS (22.'5MG'$ ) BY MOUTH AT BEDTIME 45 tablet 2   nicotine (NICODERM CQ - DOSED IN MG/24 HOURS) 21 mg/24hr patch Place 1 patch (21 mg total) onto the skin daily. 28 patch 1   metFORMIN (GLUCOPHAGE) 500 MG tablet TAKE 1 TABLET BY MOUTH DAILY WITH BREAKFAST 90 tablet 1   No facility-administered medications prior to visit.    No Known Allergies  ROS Review of Systems  Constitutional: Negative.   Respiratory: Negative.    Cardiovascular: Negative.   Gastrointestinal: Negative.   Musculoskeletal:  Positive for arthralgias (left leg pain).  Neurological: Negative.      Objective:    Physical Exam HENT:     Head: Normocephalic.  Cardiovascular:     Rate and Rhythm: Normal rate and regular rhythm.     Pulses: Normal pulses.     Heart sounds: Normal heart sounds.  Pulmonary:     Effort: Pulmonary effort is normal.     Breath sounds: Normal breath sounds.  Skin:      Neurological:     General: No focal deficit present.     Mental Status: She is alert and oriented to person, place, and time. Mental status is at baseline.  Psychiatric:        Mood and Affect: Mood normal.        Behavior:  Behavior normal.        Thought Content: Thought content normal.        Judgment: Judgment normal.    BP 128/76 (  BP Location: Right Arm, Patient Position: Sitting, Cuff Size: Large)   Pulse 68   Temp 97.9 F (36.6 C)   Resp 16   Ht '5\' 4"'$  (1.626 m)   Wt 183 lb 9.6 oz (83.3 kg)   SpO2 97%   BMI 31.51 kg/m  Wt Readings from Last 3 Encounters:  04/25/21 183 lb 9.6 oz (83.3 kg)  02/23/21 177 lb 11.2 oz (80.6 kg)  11/22/20 170 lb 9.6 oz (77.4 kg)   Encouraged weight loss  Health Maintenance Due  Topic Date Due   Hepatitis C Screening  Never done   TETANUS/TDAP  Never done   Zoster Vaccines- Shingrix (1 of 2) Never done   COVID-19 Vaccine (3 - Booster for Pfizer series) 06/06/2020    There are no preventive care reminders to display for this patient.  Lab Results  Component Value Date   TSH 1.840 04/17/2018   Lab Results  Component Value Date   WBC 8.0 06/24/2019   HGB 15.7 06/24/2019   HCT 45.7 06/24/2019   MCV 93 06/24/2019   PLT 255 06/24/2019   Lab Results  Component Value Date   NA 141 06/02/2020   K 3.9 06/02/2020   CO2 22 06/02/2020   GLUCOSE 124 (H) 06/02/2020   BUN 12 06/02/2020   CREATININE 0.75 06/02/2020   BILITOT 0.8 06/02/2020   ALKPHOS 72 06/02/2020   AST 17 06/02/2020   ALT 18 06/02/2020   PROT 7.0 06/02/2020   ALBUMIN 4.3 06/02/2020   CALCIUM 9.1 06/02/2020   ANIONGAP 10 04/27/2019   Lab Results  Component Value Date   CHOL 137 11/03/2020   Lab Results  Component Value Date   HDL 44 11/03/2020   Lab Results  Component Value Date   LDLCALC 75 11/03/2020   Lab Results  Component Value Date   TRIG 99 11/03/2020   Lab Results  Component Value Date   CHOLHDL 3.1 11/03/2020   Lab Results  Component Value Date   HGBA1C 6.1 (H) 11/03/2020      Assessment & Plan:    1. Prediabetes -She will continue on current medication, low carb/non concentrated sweet diet and exercise as tolerated. - metFORMIN (GLUCOPHAGE) 500 MG tablet;  TAKE 1 TABLET BY MOUTH DAILY WITH BREAKFAST  Dispense: 90 tablet; Refill: 1  2. Paresthesia of left leg - She will continue on gabapentin, educated on medication side effects and advised to notify clinic. She was advised to go to the ED for worsening symptoms. - gabapentin (NEURONTIN) 100 MG capsule; Take 1 capsule (100 mg total) by mouth once daily at bedtime.  Dispense: 30 capsule; Refill: 0  3. Skin mole - She was encouraged to complete [facility] [employer] application for  - Ambulatory referral to Dermatology    Follow-up: Return in about 30 days (around 05/25/2021), or if symptoms worsen or fail to improve.    Khaleel Beckom Jerold Coombe, NP

## 2021-04-26 ENCOUNTER — Other Ambulatory Visit: Payer: Self-pay

## 2021-05-08 ENCOUNTER — Other Ambulatory Visit: Payer: Self-pay

## 2021-05-15 ENCOUNTER — Other Ambulatory Visit: Payer: Self-pay

## 2021-05-16 IMAGING — CT CT CHEST LUNG CANCER SCREENING LOW DOSE W/O CM
2 of 5 series · 15 of 40 positions shown, 18 images · non-contrast
Comparison: 05/14/2019 screening chest CT.

CLINICAL DATA: 63-year-old asymptomatic female former smoker with
58.75 pack-year smoking history, quit smoking 1 year prior.

EXAM:
CT CHEST WITHOUT CONTRAST LOW-DOSE FOR LUNG CANCER SCREENING
TECHNIQUE: Multidetector CT imaging of the chest was performed following the
standard protocol without IV contrast.

[Series 3: lung 1.00 · axial · 0.75mm/px · z∈[-1135,-878]mm · 12 of 283 slices shown, 15 images]
[im 13/283  mediastinal]
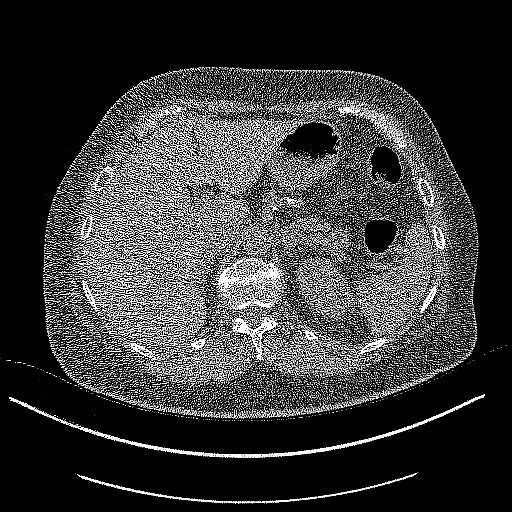
[im 13/283  lung]
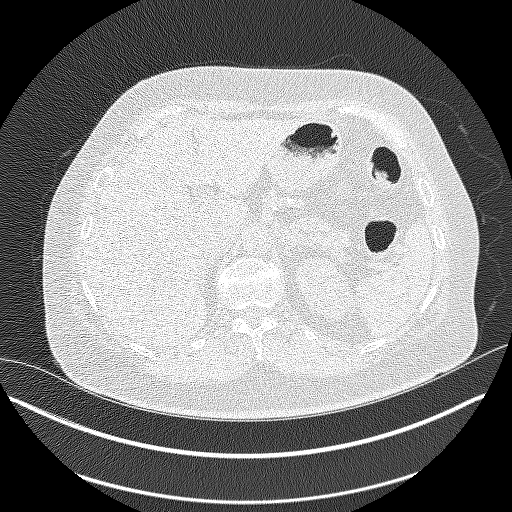
[im 39/283  lung]
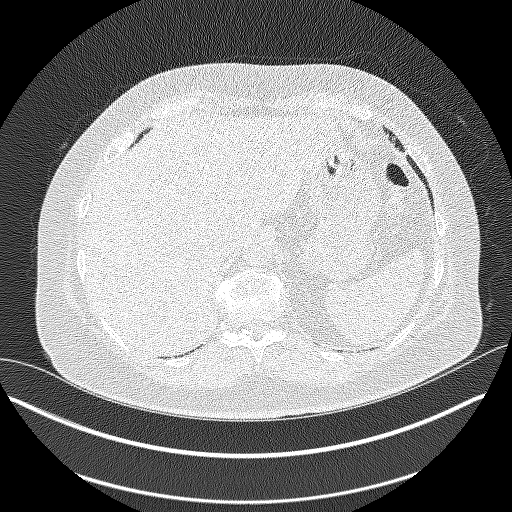
[im 65/283  lung]
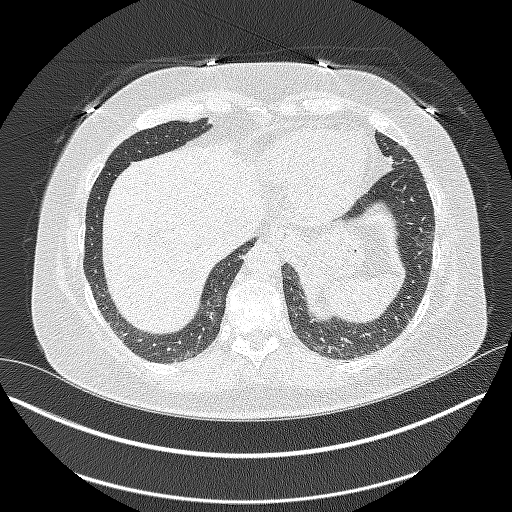
[im 90/283  lung]
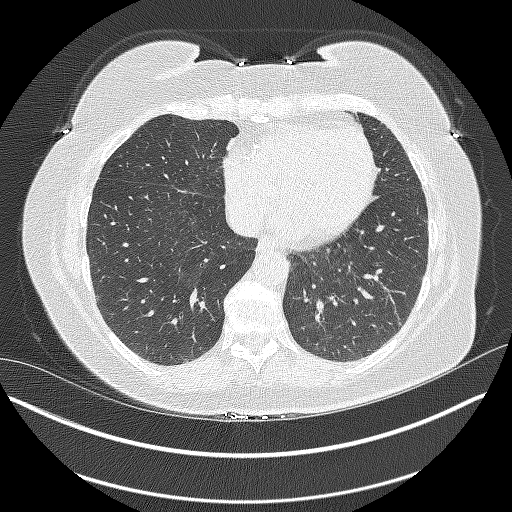
[im 103/283  mediastinal]
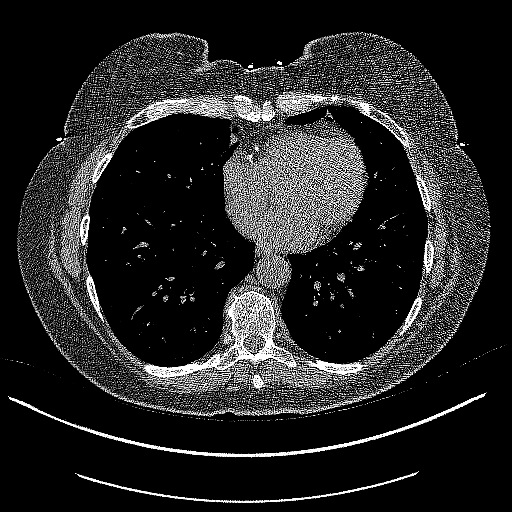
[im 103/283  lung]
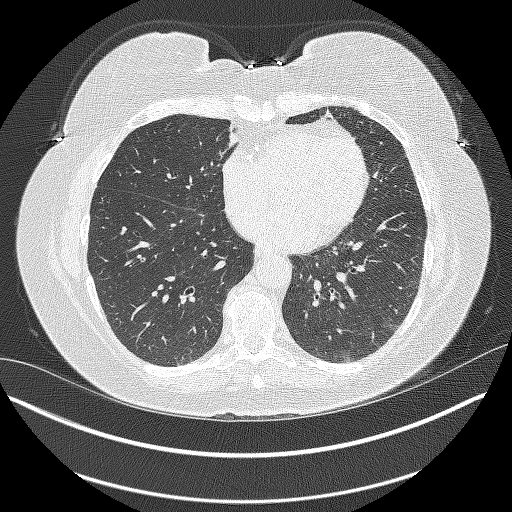
[im 129/283  lung]
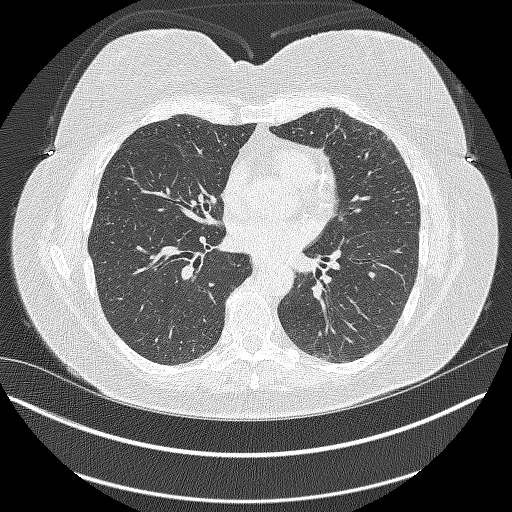
[im 154/283  lung]
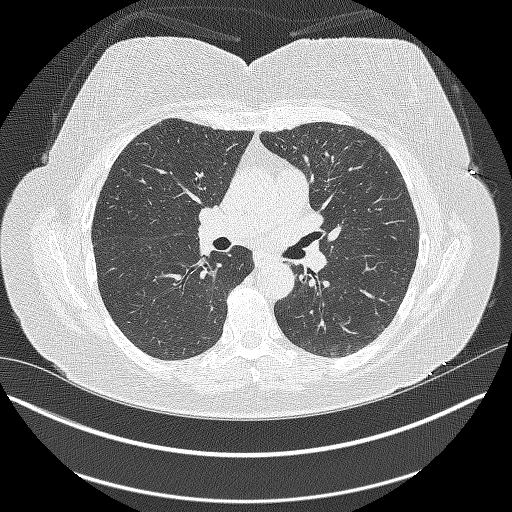
[im 180/283  lung]
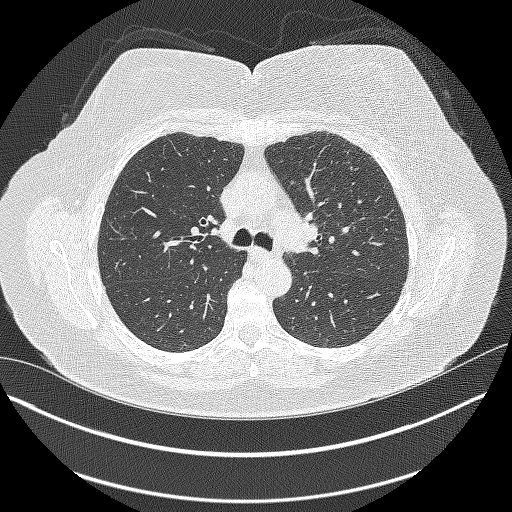
[im 193/283  mediastinal]
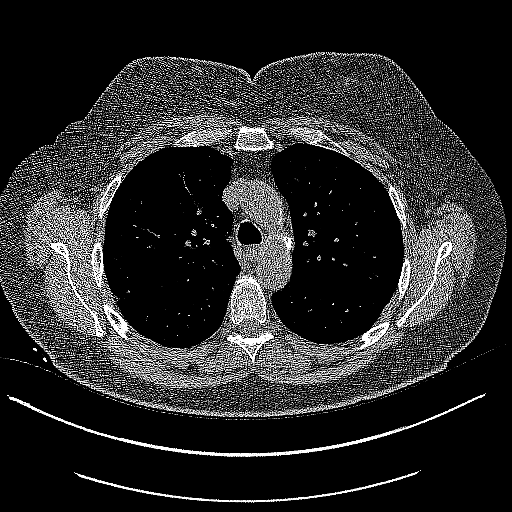
[im 193/283  lung]
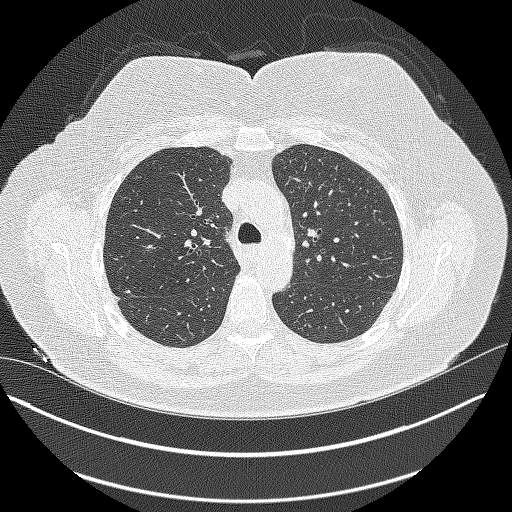
[im 218/283  lung]
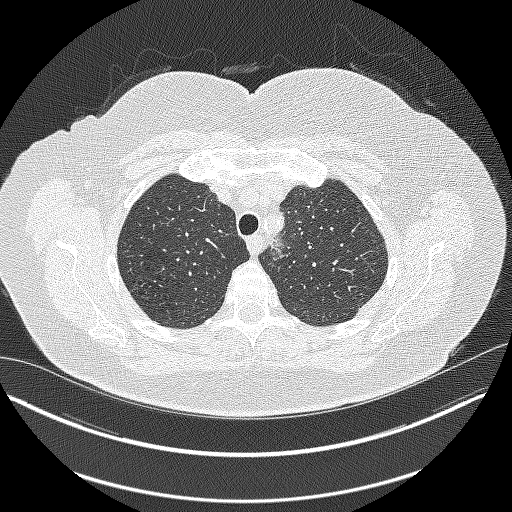
[im 244/283  lung]
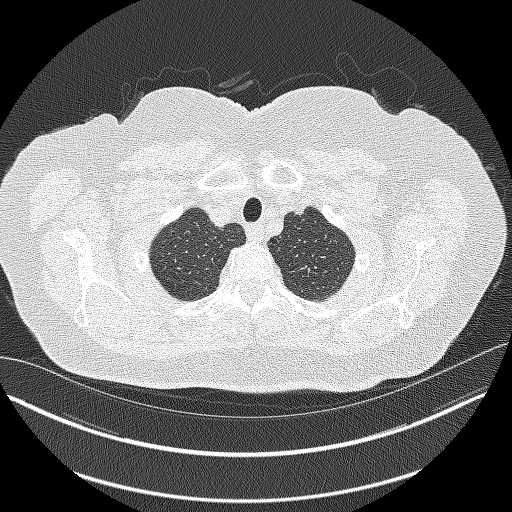
[im 270/283  lung]
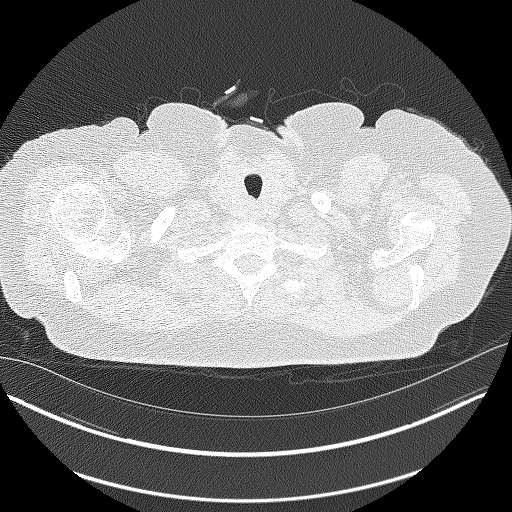

[Series 4: coronals lung 1.00 cor · coronal · 0.55mm/px · 3 of 316 slices shown]
[im 64/316  lung]
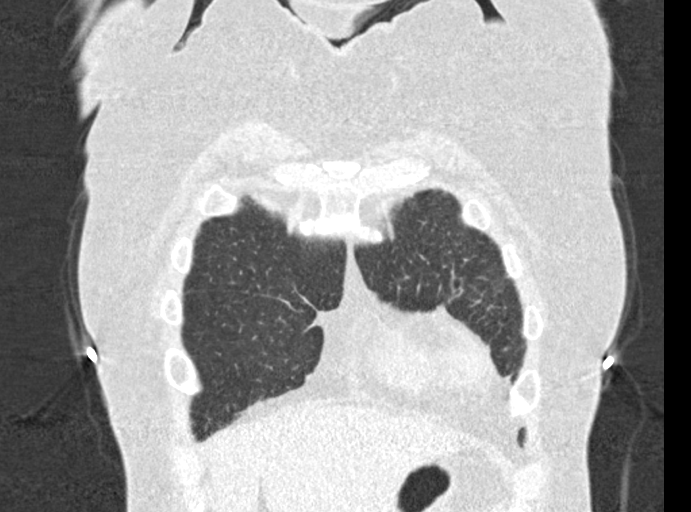
[im 127/316  lung]
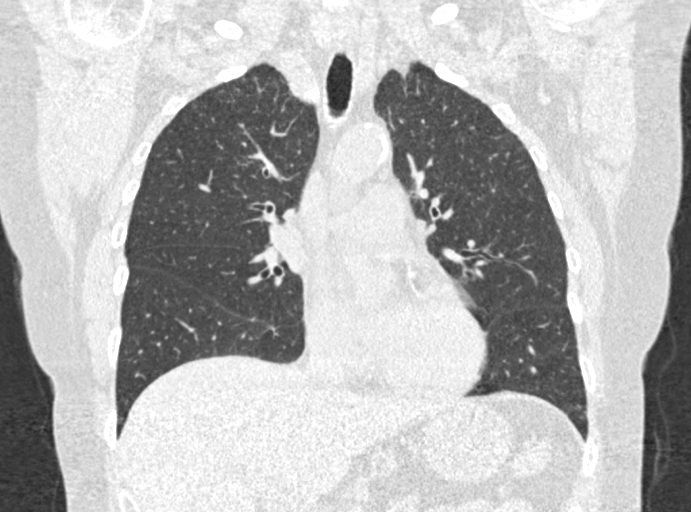
[im 190/316  lung]
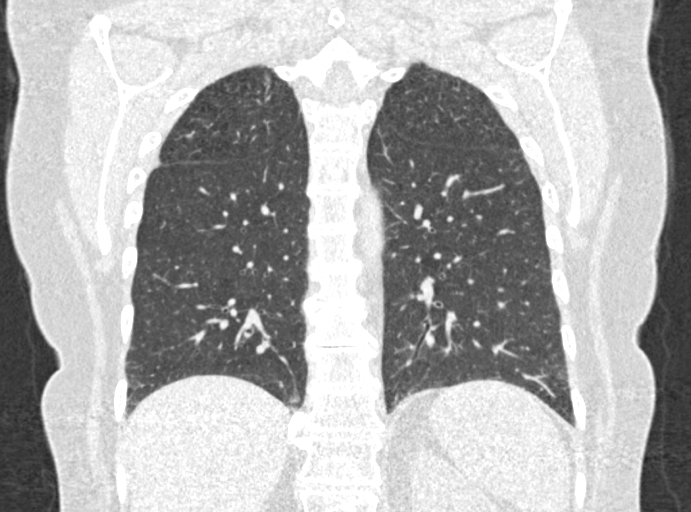

[15 of 40 positions shown; findings below may reference images not displayed]

FINDINGS: Cardiovascular: Normal heart size. No significant pericardial
effusion/thickening. Three-vessel coronary atherosclerosis.
Atherosclerotic nonaneurysmal thoracic aorta. Normal caliber
pulmonary arteries.

Mediastinum/Nodes: No discrete thyroid nodules. Unremarkable
esophagus. No pathologically enlarged axillary, mediastinal or hilar
lymph nodes, noting limited sensitivity for the detection of hilar
adenopathy on this noncontrast study.

Lungs/Pleura: No pneumothorax. No pleural effusion. Moderate
centrilobular emphysema with diffuse bronchial wall thickening. No
acute consolidative airspace disease or lung masses. No significant
growth of previously visualized scattered pulmonary nodules. No new
significant pulmonary nodules.

Upper abdomen: No acute abnormality.

Musculoskeletal: No aggressive appearing focal osseous lesions.
Moderate thoracic spondylosis.
IMPRESSION: 1. Lung-RADS 2, benign appearance or behavior. Continue annual
screening with low-dose chest CT without contrast in 12 months.
2. Three-vessel coronary atherosclerosis.
3. Aortic Atherosclerosis (E571O-7L2.2) and Emphysema (E571O-27G.V).

## 2021-05-21 ENCOUNTER — Other Ambulatory Visit: Payer: Self-pay | Admitting: Gerontology

## 2021-05-21 DIAGNOSIS — R202 Paresthesia of skin: Secondary | ICD-10-CM

## 2021-05-22 ENCOUNTER — Other Ambulatory Visit: Payer: Self-pay

## 2021-05-22 MED ORDER — GABAPENTIN 100 MG PO CAPS
100.0000 mg | ORAL_CAPSULE | Freq: Every day | ORAL | 0 refills | Status: DC
  Filled 2021-05-22: qty 30, 30d supply, fill #0
  Filled ????-??-??: fill #0

## 2021-05-23 ENCOUNTER — Other Ambulatory Visit: Payer: Self-pay

## 2021-05-25 ENCOUNTER — Other Ambulatory Visit: Payer: Self-pay

## 2021-05-25 ENCOUNTER — Ambulatory Visit: Payer: Self-pay | Admitting: Licensed Clinical Social Worker

## 2021-05-25 ENCOUNTER — Encounter: Payer: Self-pay | Admitting: Gerontology

## 2021-05-25 ENCOUNTER — Ambulatory Visit: Payer: Self-pay | Admitting: Gerontology

## 2021-05-25 VITALS — BP 129/80 | HR 75 | Temp 97.0°F | Resp 16 | Ht 64.0 in | Wt 184.3 lb

## 2021-05-25 DIAGNOSIS — R7303 Prediabetes: Secondary | ICD-10-CM

## 2021-05-25 DIAGNOSIS — R202 Paresthesia of skin: Secondary | ICD-10-CM

## 2021-05-25 DIAGNOSIS — F4323 Adjustment disorder with mixed anxiety and depressed mood: Secondary | ICD-10-CM

## 2021-05-25 LAB — POCT GLYCOSYLATED HEMOGLOBIN (HGB A1C): HbA1c POC (<> result, manual entry): 6.1 % (ref 4.0–5.6)

## 2021-05-25 MED ORDER — GABAPENTIN 100 MG PO CAPS
100.0000 mg | ORAL_CAPSULE | Freq: Every day | ORAL | 2 refills | Status: DC
  Filled 2021-05-25: qty 30, 30d supply, fill #0
  Filled 2021-06-20 – 2021-06-23 (×2): qty 30, 30d supply, fill #1
  Filled 2021-07-22: qty 30, 30d supply, fill #2

## 2021-05-25 NOTE — Progress Notes (Signed)
Established Patient Office Visit  Subjective:  Patient ID: Natalie Frey, female    DOB: 03-09-1956  Age: 65 y.o. MRN: [id number]  CC:  Chief Complaint  Patient presents with   Follow-up    HPI Natalie Frey is a 65 y/o female with PMH of T2DM, DVT femoral vein, Hyperlipidemia, Stroke presents for follow up visit. She was started on gabapentin 100 mg for paresthesia to left leg. She reports that the paresthesia improved 90% with taking gabapentin. 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 Her HgbA1c decreased from 6.2% to 6.1%, and she's compliant with her medications. Overall, she states that she's doing well and offers no further complaint.  Past Medical History:  Diagnosis Date   Cancer ([place])    bladder (10 years ago, per patient)   Diabetes mellitus without complication ([place])    type 2   Dvt femoral (deep venous thrombosis) ([place])    Hyperlipidemia    Stroke [facility])    July 2020, per patient    Past Surgical History:  Procedure Laterality Date   BLADDER SURGERY     COLONOSCOPY WITH PROPOFOL N/A 02/11/2020   Procedure: COLONOSCOPY WITH PROPOFOL;  Surgeon: Jonathon Bellows, MD;  Location: [facility] ENDOSCOPY;  Service: Gastroenterology;  Laterality: N/A;   DILATION AND EVACUATION      Family History  Problem Relation Age of Onset   Deep vein thrombosis Daughter         when pregnant    Pulmonary embolism Sister        while in hospital; other sister- on BCPs.    Breast cancer Sister 57   Diabetes Mother    Heart disease Mother    Stroke Mother    Aneurysm Brother        brain   Stroke Brother    Other Brother        unknown medical history   Hyperlipidemia Sister    Cancer Sister    Mesothelioma Father     Social History    Socioeconomic History   Marital status: Widowed    Spouse name: Not on file   Number of children: 2   Years of education: Not on file   Highest education level: High school graduate  Occupational History   Not on file  Tobacco Use   Smoking status: Some Days    Packs/day: 0.25    Years: 47.00    Pack years: 11.75    Types: Cigarettes   Smokeless tobacco: Never   Tobacco comments:    1 pack in 4 days; was able to quit for awhile. Patient stills has an occasional cigarettes, but  uses the Nicoderm patches prn  Vaping Use   Vaping Use: Never used  Substance and Sexual Activity   Alcohol use: Not Currently    Comment: last use 11/2020   Drug use: Not Currently    Types: Marijuana, Cocaine    Comment: last use 11/14/20 (marijuana)   Sexual activity: Not Currently  Other Topics Concern   Not on file  Social History Narrative   [place]; smoking; 1ppd/cutting down. Social. Worked for [employer] from [place].       Said she has been doing ok and does not need any assistance          -  is currently on food stamps but those have been enough       NO consent form signed    Social Determinants of Health   Financial Resource Strain: Not on file  Food Insecurity: No Food Insecurity   Worried About Charity fundraiser in the Last Year: Never true   Ran Out of Food in the Last Year: Never true  Transportation Needs: No Transportation Needs   Lack of Transportation (Medical): No   Lack of Transportation (Non-Medical): No  Physical Activity: Not on file  Stress: Not on file  Social Connections: Not on file  Intimate Partner Violence: Not on file    Outpatient Medications Prior to Visit  Medication Sig Dispense Refill   acetaminophen (TYLENOL) 325 MG tablet Take 2 tablets (650 mg total) by mouth every 6 (six) hours as needed for mild pain (or Fever >/= 101).     aspirin EC 81 MG tablet Take 1 tablet (81 mg total) by mouth once daily. 30 tablet 3   atorvastatin (LIPITOR) 80  MG tablet TAKE ONE TABLET BY MOUTH EVERY DAY AT 6:00PM 90 tablet 1   estradiol (ESTRACE) 0.1 MG/GM vaginal cream Place vaginally. 2 times per week     metFORMIN (GLUCOPHAGE) 500 MG tablet TAKE 1 TABLET BY MOUTH DAILY WITH BREAKFAST 90 tablet 1   mirtazapine (REMERON) 15 MG tablet TAKE 1 1/2 TABLETS (22.'5MG'$ ) BY MOUTH AT BEDTIME 45 tablet 2   nicotine (NICODERM CQ - DOSED IN MG/24 HOURS) 21 mg/24hr patch Place 1 patch (21 mg total) onto the skin daily. 28 patch 1   gabapentin (NEURONTIN) 100 MG capsule Take 1 capsule (100 mg total) by mouth once daily at bedtime. 30 capsule 0   No facility-administered medications prior to visit.    No Known Allergies  ROS Review of Systems  Constitutional: Negative.   Respiratory: Negative.    Cardiovascular: Negative.   Neurological: Negative.   Psychiatric/Behavioral: Negative.       Objective:    Physical Exam HENT:     Head: Normocephalic and atraumatic.     Mouth/Throat:     Mouth: Mucous membranes are moist.  Cardiovascular:     Rate and Rhythm: Normal rate and regular rhythm.     Pulses: Normal pulses.     Heart sounds: Normal heart sounds.  Pulmonary:     Effort: Pulmonary effort is normal.     Breath sounds: Normal breath sounds.  Neurological:     General: No focal deficit present.     Mental Status: She is alert and oriented to person, place, and time. Mental status is at baseline.  Psychiatric:        Mood and Affect: Mood normal.        Behavior: Behavior normal.        Thought Content: Thought content normal.        Judgment: Judgment normal.    BP 129/80 (BP Location: Left Arm, Patient Position: Sitting, Cuff Size: Large)   Pulse 75   Temp (!) 97 F (36.1 C)   Resp 16   Ht '5\' 4"'$  (1.626 m)   Wt 184 lb 4.8 oz (83.6 kg)   SpO2 98%   BMI 31.64 kg/m  Wt Readings from Last 3 Encounters:  05/25/21 184 lb 4.8 oz (83.6 kg)  04/25/21 183 lb 9.6 oz (83.3 kg)  02/23/21 177 lb 11.2 oz (80.6 kg)   Encouraged weight  loss  Health Maintenance Due  Topic Date Due  Hepatitis C Screening  Never done   TETANUS/TDAP  Never done   Zoster Vaccines- Shingrix (1 of 2) Never done   COVID-19 Vaccine (3 - Booster for Pfizer series) 06/06/2020   Pneumococcal Vaccine 67-48 Years old (2 - PCV) 06/25/2020   INFLUENZA VACCINE  05/01/2021    There are no preventive care reminders to display for this patient.  Lab Results  Component Value Date   TSH 1.840 04/17/2018   Lab Results  Component Value Date   WBC 8.0 06/24/2019   HGB 15.7 06/24/2019   HCT 45.7 06/24/2019   MCV 93 06/24/2019   PLT 255 06/24/2019   Lab Results  Component Value Date   NA 141 06/02/2020   K 3.9 06/02/2020   CO2 22 06/02/2020   GLUCOSE 124 (H) 06/02/2020   BUN 12 06/02/2020   CREATININE 0.75 06/02/2020   BILITOT 0.8 06/02/2020   ALKPHOS 72 06/02/2020   AST 17 06/02/2020   ALT 18 06/02/2020   PROT 7.0 06/02/2020   ALBUMIN 4.3 06/02/2020   CALCIUM 9.1 06/02/2020   ANIONGAP 10 04/27/2019   Lab Results  Component Value Date   CHOL 137 11/03/2020   Lab Results  Component Value Date   HDL 44 11/03/2020   Lab Results  Component Value Date   LDLCALC 75 11/03/2020   Lab Results  Component Value Date   TRIG 99 11/03/2020   Lab Results  Component Value Date   CHOLHDL 3.1 11/03/2020   Lab Results  Component Value Date   HGBA1C 6.1 05/25/2021      Assessment & Plan:    1. Paresthesia of left leg - She reports significant improvement in her leg, she will continue on current medication . - gabapentin (NEURONTIN) 100 MG capsule; Take 1 capsule (100 mg total) by mouth once daily at bedtime.  Dispense: 30 capsule; Refill: 2  2. Prediabetes - Her HgbA1c was 6.1%, she will continue on current medication, low carb/non concentrated sweet diet and exercise as tolerated. - POCT HgB A1C; Future - POCT HgB A1C     Follow-up: Return in about 3 months (around 09/06/2021), or if symptoms worsen or fail to improve.     Orena Cavazos Jerold Coombe, NP

## 2021-05-25 NOTE — BH Specialist Note (Signed)
Integrated Behavioral Health Follow Up Telephone Visit  MRN: [id number] Name: Natalie Frey  Total time: 60 minutes  Types of Service: Telephone visit Patient consents to telephone visit and 2 patient identifiers were used to identify patient  Interpretor:No. Interpretor Name and Language: N/A  Subjective: Natalie Frey is a 65 y.o. female accompanied by  herself Patient was referred by Carlyon Shadow, NP  for mental health. Patient reports the following symptoms/concerns: The patient reported that she has been doing good since her last follow-up appointment. She shared that she has heard back from [facility] and her GYN and Bladder surgery should be scheduled by November. She explained that she wanted to travel with her boyfriend to [place] but decided to stay home because it is too difficult to travel and stay near a bathroom. She shared her previous experiences with travel and the frustrations and embarrassment she experienced. She discussed familial stressors impacting her life and the coping skills she uses to help deal with them. She shared that she is looking forward to having more freedom after her surgery and is excited for her future. Overall, the patient reports she is coping much better with stress and feels her anxiety and depression symptoms are manageable. The patient denied any suicidal or homicidal thoughts.  Duration of problem: Years; Severity of problem: mild  Objective: Mood: Euthymic and Affect: Appropriate Risk of harm to self or others: No plan to harm self or others  Life Context: Family and Social: see above School/Work: see above Self-Care: see above Life Changes: see above  Patient and/or Family's Strengths/Protective Factors: Concrete supports in place (healthy food, safe environments, etc.)  Goals Addressed: Patient will:  Reduce symptoms of: agitation, anxiety, depression, insomnia, and stress   Increase knowledge and/or ability of: coping skills, healthy  habits, self-management skills, and stress reduction   Demonstrate ability to: Increase healthy adjustment to current life circumstances  Progress towards Goals: Ongoing  Interventions: Interventions utilized:  CBT Cognitive Behavioral Therapy was utilized by the clinician during today's follow up session. Clinician met with patient to identify needs related to stressors and functioning, and assess and monitor for signs and symptoms of anxiety and depression, and assess safety. The clinician processed with the patient how they have been doing since the last follow-up session. The clinician provided a space for the patient to ventilate their frustrations regarding their current life circumstances. Clinician measured the patient's anxiety and depression on a numerical scale.  Clinician congratulated the patient on doing so well and utilizing self care and her coping skills to deal with stressors in her life. Clinician encouraged the patient to reach out should she need an earlier appointment.  Standardized Assessments completed: GAD-7 and PHQ 9 GAD-7     3 PHQ- 9    3 Assessment: Patient currently experiencing see above.   Patient may benefit from see above.  Plan: Follow up with behavioral health clinician on : 06/29/2021 at 4:00 PM  Behavioral recommendations:  Referral(s): [place] (In Clinic) "From scale of 1-10, how likely are you to follow plan?":   Lesli Albee, LCSWA

## 2021-06-06 ENCOUNTER — Other Ambulatory Visit: Payer: Self-pay

## 2021-06-12 ENCOUNTER — Other Ambulatory Visit: Payer: Self-pay

## 2021-06-13 ENCOUNTER — Other Ambulatory Visit: Payer: Self-pay

## 2021-06-20 ENCOUNTER — Other Ambulatory Visit: Payer: Self-pay

## 2021-06-23 ENCOUNTER — Other Ambulatory Visit: Payer: Self-pay

## 2021-06-29 ENCOUNTER — Ambulatory Visit: Payer: Self-pay | Admitting: Licensed Clinical Social Worker

## 2021-06-29 ENCOUNTER — Other Ambulatory Visit: Payer: Self-pay | Admitting: Gerontology

## 2021-06-29 ENCOUNTER — Other Ambulatory Visit: Payer: Self-pay

## 2021-06-29 DIAGNOSIS — F4323 Adjustment disorder with mixed anxiety and depressed mood: Secondary | ICD-10-CM

## 2021-06-29 DIAGNOSIS — F418 Other specified anxiety disorders: Secondary | ICD-10-CM

## 2021-06-29 MED ORDER — MIRTAZAPINE 15 MG PO TABS
ORAL_TABLET | ORAL | 2 refills | Status: DC
  Filled 2021-06-29: qty 45, fill #0
  Filled 2021-07-13: qty 45, 30d supply, fill #0
  Filled 2021-08-13: qty 45, 30d supply, fill #1
  Filled 2021-09-07: qty 45, 30d supply, fill #2

## 2021-06-29 NOTE — BH Specialist Note (Signed)
Integrated Behavioral Health Follow Up Telephone Visit  MRN: [id number] Name: Natalie Frey  Total time: 60 minutes  Types of Service: Telephone visit Patient consents to telephone visit and 2 patient identifiers were used to identify patient   Interpretor:No. Interpretor Name and Language: N/A  Subjective: Natalie Frey is a 65 y.o. female accompanied by  herself Patient was referred by Carlyon Shadow, NP for Mental Health. Patient reports the following symptoms/concerns: The patient reports that she has been doing very well since her last follow-up appointment. She explained that she felt disappointed because she could not go with her boyfriend to [place] because of her health issues. She noted she wanted to visit her late husbands grave in remembrance of their anniversary. She share that she sent her granddaughter money to buy flowers and take them in her stead. She shared that work has been going well and she is using assertive communication to advocate for her needs. She shared that she is eating and sleeping well and is looking forward to the Spring when she can get back to enjoying outing again. Reneka reported her surgery has been scheduled for December 13 th. She shared that feels this will make it difficult to feel like celebrating Christmas. However she noted her boyfriend encouraged her to put up her tree early just to in case she feels up to it. Elianys discussed family and health stressors, but felt that she was able to manage them well using her coping skills. Aariel requested medication refills for Mirtazapine. The patient denied any suicidal or homicidal thoughts.  Duration of problem: Years; Severity of problem: moderate  Objective: Mood: Euthymic and Affect: Appropriate Risk of harm to self or others: No plan to harm self or others  Life Context: Family and Social: see above School/Work: see above Self-Care: see above Life Changes: see above  Patient and/or Family's  Strengths/Protective Factors: Concrete supports in place (healthy food, safe environments, etc.) and Sense of purpose  Goals Addressed: Patient will:  Reduce symptoms of: agitation, anxiety, depression, and stress   Increase knowledge and/or ability of: coping skills, healthy habits, self-management skills, and stress reduction   Demonstrate ability to: Increase healthy adjustment to current life circumstances  Progress towards Goals: Ongoing  Interventions: Interventions utilized:  CBT Cognitive Behavioral Therapy was utilized by the clinician during today's follow up session. The clinician processed with the patient how they have been doing since the last follow-up session. The clinician provided a space for the patient to ventilate their frustrations regarding their current life circumstances. Clinician worked with patient to identify needs related to stressors and functioning, and assess and monitor for signs and symptoms of anxiety and depression, and assess safety.  Clinician measured the patient's anxiety and depression on a numerical scale. Clinician congratulated the patient on her progress and encouraged her to continue to use her coping skills to deal with her current life circumstances. Clinician offered to message the patient's provider regarding her psychotropic medication refill request.  Standardized Assessments completed: GAD-7 and PHQ 9 GAD-7 = 2 PHQ-9 = 3  Assessment: Patient currently experiencing see above.   Patient may benefit from see above.  Plan: Follow up with behavioral health clinician on : 08/03/2021 at 4:00 PM  Behavioral recommendations:  Referral(s): [place] (In Clinic) "From scale of 1-10, how likely are you to follow plan?":   Lesli Albee, LCSWA

## 2021-06-30 ENCOUNTER — Other Ambulatory Visit: Payer: Self-pay

## 2021-07-03 ENCOUNTER — Other Ambulatory Visit: Payer: Self-pay

## 2021-07-08 ENCOUNTER — Other Ambulatory Visit: Payer: Self-pay | Admitting: Gerontology

## 2021-07-08 DIAGNOSIS — E782 Mixed hyperlipidemia: Secondary | ICD-10-CM

## 2021-07-11 ENCOUNTER — Other Ambulatory Visit: Payer: Self-pay

## 2021-07-11 MED ORDER — ASPIRIN EC 81 MG PO TBEC
81.0000 mg | DELAYED_RELEASE_TABLET | Freq: Every day | ORAL | 3 refills | Status: DC
  Filled 2021-07-11: qty 30, 30d supply, fill #0
  Filled 2021-08-06: qty 30, 30d supply, fill #1
  Filled 2021-09-07: qty 30, 30d supply, fill #2
  Filled 2021-10-12: qty 30, 30d supply, fill #3

## 2021-07-12 ENCOUNTER — Other Ambulatory Visit: Payer: Self-pay

## 2021-07-14 ENCOUNTER — Other Ambulatory Visit: Payer: Self-pay

## 2021-07-24 ENCOUNTER — Other Ambulatory Visit: Payer: Self-pay

## 2021-08-03 ENCOUNTER — Ambulatory Visit: Payer: Self-pay | Admitting: Licensed Clinical Social Worker

## 2021-08-03 ENCOUNTER — Other Ambulatory Visit: Payer: Self-pay

## 2021-08-03 DIAGNOSIS — F4323 Adjustment disorder with mixed anxiety and depressed mood: Secondary | ICD-10-CM

## 2021-08-03 NOTE — BH Specialist Note (Signed)
Integrated Behavioral Health Follow Up Telephone Visit  MRN: [id number] Name: Natalie Frey   Total time: 60 minutes  Types of Service: Telephone visit Patient consents to telephone visit and 2 patient identifiers were used to identify patient   Interpretor:No. Interpretor Name and Language: N/A  Subjective: Natalie Frey is a 65 y.o. female accompanied by  herself Patient was referred by Carlyon Shadow, NP for Mental Health. Patient reports the following symptoms/concerns: The patient reported that she has been doing well since her last follow-up appointment. She shared that she is currently relaxing on the beach at [place], [place] with her boyfriend. She discussed work related stressor that have been impacting her life. She explained that she decided to take a break and go to the beach and found reading a book while her boyfriend fished was calming and she felt she needed that time after the last couple of weeks at work. Natalie Frey explained that she spoke with her daughter for the first time in months and decided to reach out so she could see her grandchildren. She noted it went well and discussed her plans for Christmas gifts for her grandchildren. The patient discussed feeling anxious and concerned regarding her upcoming surgery in December. She noted that she did agree with her boyfriend about putting their Christmas tree up before she goes in, but is not sure how the holidays will be while she recovers. Natalie Frey shared that she is not sure about when she should begin her leave at work and noted she is trying not to worry because she knows everything will be fine, but finds herself thinking about it a lot. The patient discussed looking forward to being in a place to travel back home by Spring. Natalie Frey denied any suicidal or homicidal thought.  Duration of problem: Years; Severity of problem: moderate  Objective: Mood: Euthymic and Affect: Appropriate Risk of harm to self or others: No plan to harm self  or others  Life Context: Family and Social: see above School/Work: see above Self-Care: see above Life Changes: see above  Patient and/or Family's Strengths/Protective Factors: Social connections, Concrete supports in place (healthy food, safe environments, etc.), and Sense of purpose  Goals Addressed: Patient will:  Reduce symptoms of: anxiety, depression, insomnia, and stress   Increase knowledge and/or ability of: coping skills, healthy habits, self-management skills, and stress reduction   Demonstrate ability to: Increase healthy adjustment to current life circumstances and Increase adequate support systems for patient/family  Progress towards Goals: Ongoing  Interventions: Interventions utilized:  CBT Cognitive Behavioral Therapywas utilized by the clinician during today's follow up session. Clinician met with patient to identify needs related to stressors and functioning, and assess and monitor for signs and symptoms of anxiety and depression, and assess safety. The clinician processed with the patient how they have been doing since the last follow-up session. Clinician provided a judgement free space for the patient to ventilate her frustrations regarding her current life circumstances. Clinician congratulated the patient on practicing self care and taking the time to focus on her physical and mental health. Clinician normalized the patients anxiety regarding her upcoming surgery in December.Clinician encouraged Teylor to reach out for an earlier appointment should her anxiety increase. Clinician checked in with the patient regarding her use of her psychotropic medications; Clinician verified with the patient that she continues to take her psychotropic medication exactly as prescribed and did not require refills at this time. Session ended with scheduling. Standardized Assessments completed: GAD-7 and PHQ 9  GAD-7 = 5 PHQ-9 = 1 Assessment: Patient currently experiencing see above.    Patient may benefit from see above.  Plan: Follow up with behavioral health clinician on : 09/05/2021 at 4:00 PM  Behavioral recommendations:  Referral(s): [place] (In Clinic) "From scale of 1-10, how likely are you to follow plan?":   Lesli Albee, LCSWA

## 2021-08-07 ENCOUNTER — Other Ambulatory Visit: Payer: Self-pay

## 2021-08-14 ENCOUNTER — Other Ambulatory Visit: Payer: Self-pay

## 2021-08-20 ENCOUNTER — Other Ambulatory Visit: Payer: Self-pay | Admitting: Gerontology

## 2021-08-20 DIAGNOSIS — R202 Paresthesia of skin: Secondary | ICD-10-CM

## 2021-08-21 ENCOUNTER — Other Ambulatory Visit: Payer: Self-pay

## 2021-08-22 ENCOUNTER — Other Ambulatory Visit: Payer: Self-pay

## 2021-08-22 MED ORDER — GABAPENTIN 100 MG PO CAPS
100.0000 mg | ORAL_CAPSULE | Freq: Every day | ORAL | 2 refills | Status: DC
  Filled 2021-08-22: qty 30, 30d supply, fill #0
  Filled 2021-09-17: qty 30, 30d supply, fill #1

## 2021-08-30 ENCOUNTER — Ambulatory Visit: Payer: Self-pay | Admitting: Gerontology

## 2021-08-31 HISTORY — PX: ABDOMINAL HYSTERECTOMY: SHX81

## 2021-09-05 ENCOUNTER — Other Ambulatory Visit: Payer: Self-pay

## 2021-09-05 ENCOUNTER — Ambulatory Visit: Payer: Self-pay | Admitting: Licensed Clinical Social Worker

## 2021-09-05 DIAGNOSIS — F4323 Adjustment disorder with mixed anxiety and depressed mood: Secondary | ICD-10-CM

## 2021-09-05 NOTE — BH Specialist Note (Signed)
Integrated Behavioral Health Follow Up Telephone Visit  MRN: [id number] Name: Natalie Frey   Total time: 60 minutes  Types of Service: Telephone visitPatient consents to telephone visit and 2 patient identifiers were used to identify patient   Interpretor:No. Interpretor Name and Language: N/A  Subjective: Natalie Frey is a 65 y.o. female accompanied by  herself Patient was referred by Carlyon Shadow, NP for N/A. Patient reports the following symptoms/concerns: The patient reports that she has been doing about the same since her last follow-up appointment. She noted that she has been doing well overall and is not experiencing any depression or anxiety symptoms other than feeling nervous about her upcoming surgery. [name] discussed relationship stressors impacting her life. She shared that she decorated her greenhouse with her boyfriend for christmas this year instead of doing the tree because of her upcoming surgery. [name] noted that she has completed the  pre-operative appointments and is scheduled for Tuesday morning at 7:00 am. She shared she met her anesthesiologist and feels he is competent and very safe and professional which helped ease her mind. She explained that while she is not experiencing much anxiety at the moment that it likely will build until this is over. She explained that she was disappointed to learn that her urinary incontinence will not be 100% resolved by this procedure. She explained that she is still happy she is getting this done. Silver discussed her recent self care and trips to the beach with her boyfriend. She noted that she is looking forward to taking time off work beginning this Friday. [name] denied any suicidal or homicidal thoughts.  Duration of problem: Years; Severity of problem: moderate  Objective: Mood: Euthymic and Affect: Appropriate Risk of harm to self or others: No plan to harm self or others  Life Context: Family and Social: see above School/Work: see  above Self-Care: see above Life Changes: see above  Patient and/or Family's Strengths/Protective Factors: Concrete supports in place (healthy food, safe environments, etc.) and Sense of purpose  Goals Addressed: Patient will:  Reduce symptoms of: agitation, anxiety, and depression   Increase knowledge and/or ability of: coping skills, healthy habits, self-management skills, and stress reduction   Demonstrate ability to: Increase healthy adjustment to current life circumstances  Progress towards Goals: Ongoing  Interventions: Interventions utilized:  Supportive Counseling was utilized by the clinician during today's follow up session. Clinician met with patient to identify needs related to stressors and functioning, and assess and monitor for signs and symptoms of anxiety and depression, and assess safety. The clinician processed with the patient how they have been doing since the last follow-up session. Clinician measured the patient anxiety and depression on a numerical scale. Clinician intervened with positive regard and optimism to validate client's emotions, and supported client in exploring ways to reduce the patient's anxiety regarding her upcoming surgery. Clinician offered to message the patient's PCP regarding her medication refills. Clinician rescheduled the patient's appointment due to conflict with her surgery date. Session ended with scheduling.   Standardized Assessments completed: GAD-7 and PHQ 9 PHQ- 2 = 1 GAD-7 =  5  Assessment: Patient currently experiencing see above.   Patient may benefit from see above.  Plan: Follow up with behavioral health clinician on : 10/04/2021 at 3:00 pm Behavioral recommendations:  Referral(s): [place] (In Clinic) "From scale of 1-10, how likely are you to follow plan?":   Lesli Albee, LCSWA

## 2021-09-08 ENCOUNTER — Other Ambulatory Visit: Payer: Self-pay

## 2021-09-13 ENCOUNTER — Ambulatory Visit: Payer: Self-pay | Admitting: Gerontology

## 2021-09-14 ENCOUNTER — Other Ambulatory Visit: Payer: Self-pay

## 2021-09-18 ENCOUNTER — Other Ambulatory Visit: Payer: Self-pay

## 2021-09-26 ENCOUNTER — Other Ambulatory Visit: Payer: Self-pay

## 2021-10-04 ENCOUNTER — Ambulatory Visit: Payer: Self-pay | Admitting: Licensed Clinical Social Worker

## 2021-10-04 ENCOUNTER — Other Ambulatory Visit: Payer: Self-pay

## 2021-10-04 DIAGNOSIS — F4323 Adjustment disorder with mixed anxiety and depressed mood: Secondary | ICD-10-CM

## 2021-10-04 NOTE — BH Specialist Note (Signed)
Integrated Behavioral Health via Telemedicine Visit  10/04/2021 GAYLENE MOYLAN [id number]   Total time: 58  Referring Provider: Carlyon Shadow, NP  Patient/Family location: The patient's home address.  [facility] Provider location: The  All persons participating in visit: Tylee Yum and Jerrilyn Cairo, LCSW-A Types of Service: Telephone visit  I connected with [name] and via  Telephone or Video Enabled Telemedicine Application  (Video is Caregility application) and verified that I am speaking with the correct person using two identifiers. Discussed confidentiality: Yes   I discussed the limitations of telemedicine and the availability of in person appointments.  Discussed there is a possibility of technology failure and discussed alternative modes of communication if that failure occurs.  Patient and/or legal guardian expressed understanding and consented to Telemedicine visit: Yes   Presenting Concerns: Patient and/or family reports the following symptoms/concerns: The patient reports she has been doing well since her last follow-up appointment. She shared that the day of her surgery she became tearful and cried as they took her to the operating room. She shared that she felt very nervous, but the nurse provided her support throughout the process and she woke up and went home with no difficulties. The patient explained that her recovery has gone well. Syndi shared that she continues to feel slightly more anxious than usual. She explained that her daughter invited her over for Christmas and she enjoyed spending time with her grandchildren, but found herself feeling agitated at times. Moreen shared that overall she is doing well and she feels supported by her neighbor who has brought her books to read and by her boyfriend and children. She noted her employer and coworkers have not called to check on her and she felt let down and disappointed. She shared that her pet cat passed away, and she plans to  plant a tree in remembrance in the Spring. The patient discussed her plans to teach her granddaughter to sew and is looking forward to getting back into a hobby she previously enjoyed. Edit denied any suicidal or homicidal thoughts.    Duration of problem: Years; Severity of problem: mild  Patient and/or Family's Strengths/Protective Factors: Social connections, Concrete supports in place (healthy food, safe environments, etc.), and Sense of purpose  Goals Addressed: Patient will:  Reduce symptoms of: agitation, anxiety, depression, and stress   Increase knowledge and/or ability of: coping skills, healthy habits, self-management skills, and stress reduction   Demonstrate ability to: Increase healthy adjustment to current life circumstances and Increase adequate support systems for patient/family  Progress towards Goals: Ongoing  Interventions: Interventions utilized:  Supportive Counselingwas utilized by the clinician during today's follow up session. Clinician met with patient to identify needs related to stressors and functioning, and assess and monitor for signs and symptoms of anxiety and depression, and assess safety. The clinician processed with the patient how they have been doing since the last follow-up session. Clinician measured the patient's anxiety and depression on numerical scale.Clinician normalized the patient's feelings of anxiety regarding her recent surgery and her increased sense of boredom during her recovery. The clinician encouraged the patient to utilize their coping skills to deal with their current life circumstances. Session ended with scheduling. Standardized Assessments completed: GAD-7 and PHQ 9 PHQ-9 = 1 GAD-7 = 7  Assessment: Patient currently experiencing see above.   Patient may benefit from see above.  Plan: Follow up with behavioral health clinician on : 11/02/2021 at 4:00 PM  Behavioral recommendations:  Referral(s): [place] (  In Clinic)  I discussed the assessment and treatment plan with the patient and/or parent/guardian. They were provided an opportunity to ask questions and all were answered. They agreed with the plan and demonstrated an understanding of the instructions.   They were advised to call back or seek an in-person evaluation if the symptoms worsen or if the condition fails to improve as anticipated.  Lesli Albee, LCSWA

## 2021-10-05 ENCOUNTER — Ambulatory Visit: Payer: Self-pay | Admitting: Gerontology

## 2021-10-11 ENCOUNTER — Encounter: Payer: Self-pay | Admitting: Pharmacist

## 2021-10-11 ENCOUNTER — Other Ambulatory Visit: Payer: Self-pay

## 2021-10-11 ENCOUNTER — Ambulatory Visit: Payer: Self-pay | Admitting: Pharmacist

## 2021-10-11 NOTE — Progress Notes (Signed)
Medication Management Clinic Visit Note  Patient: Natalie Frey MRN: [id number] Date of Birth: Jun 30, 1956 PCP: Langston Reusing, NP   [name] 66 y.o. female presents for an MTM visit via telephone call today. Verified correctly 2 patient identifiers being full name and date of birth.  There were no vitals taken for this visit.  Patient Information   Past Medical History:  Diagnosis Date   Cancer [facility]    bladder (10 years ago, per patient)   Dvt femoral (deep venous thrombosis) ([place])    Hyperlipidemia    Prediabetes    Stroke [facility])    July 2020, per patient      Past Surgical History:  Procedure Laterality Date   ABDOMINAL HYSTERECTOMY     BLADDER SURGERY     COLONOSCOPY WITH PROPOFOL N/A 02/11/2020   Procedure: COLONOSCOPY WITH PROPOFOL;  Surgeon: Jonathon Bellows, MD;  Location: [facility] ENDOSCOPY;  Service: Gastroenterology;  Laterality: N/A;   DILATION AND EVACUATION       Family History  Problem Relation Age of Onset   Diabetes Mother    Heart disease Mother    Stroke Mother    Mesothelioma Father    Hypertension Sister    Pulmonary embolism Sister        while in hospital; other sister- on BCPs.    Breast cancer Sister 74   Hypertension Sister    Hyperlipidemia Sister    Cancer Sister    Aneurysm Brother        brain   Stroke Brother    Other Brother        unknown medical history   Deep vein thrombosis Daughter         when pregnant     New Diagnoses (since last visit):   Family Support: Good  Lifestyle Diet: Breakfast: [place] toast. Lunch: Pizza. Dinner: Fish or chicken, rice, potatoes, and vegetables. Drinks: Water with flavoring and 2 cups of coffee in the morning.    Current Exercise Habits: Home exercise routine (Patient walks when able.), Type of exercise: walking, Intensity: Mild       Social History   Substance and Sexual Activity  Alcohol Use Not Currently   Comment: last use 11/2020      Social History   Tobacco Use   Smoking Status Some Days   Packs/day: 0.25   Years: 47.00   Pack years: 11.75   Types: Cigarettes  Smokeless Tobacco Never  Tobacco Comments   1 pack in 4 days; was able to quit for awhile. Patient stills has an occasional cigarettes, but  uses the Nicoderm patches prn      Health Maintenance  Topic Date Due   Hepatitis C Screening  Never done   TETANUS/TDAP  Never done   Zoster Vaccines- Shingrix (1 of 2) Never done   COVID-19 Vaccine (3 - Pfizer risk series) 02/02/2020   Pneumococcal Vaccine 23-51 Years old (2 - PCV) 06/25/2020   INFLUENZA VACCINE  05/01/2021   URINE MICROALBUMIN  11/03/2021   HEMOGLOBIN A1C  11/25/2021   OPHTHALMOLOGY EXAM  12/09/2021   MAMMOGRAM  02/16/2022   FOOT EXAM  02/23/2022   PAP SMEAR-Modifier  02/17/2023   COLONOSCOPY (Pts 45-28yrs Insurance coverage will need to be confirmed)  02/11/2025   HIV Screening  Completed   HPV VACCINES  Aged Out   Health Maintenance/Date Completed  Last ED visit: 04/27/2019 Last Visit to PCP: 05/25/2021 Next Visit to PCP:  Next week patient reports. Specialist Visit: 10/04/2021  Dental Exam:  Years ago. Eye Exam:  Years ago. (12/09/2021) Prostate Exam: N/A Pelvic/PAP Exam: No Mammogram: No DEXA: No Colonoscopy: 02/11/2020 Flu Vaccine: 05/01/2021 Pneumonia Vaccine: 06/25/2020 COVID-19 Vaccine: 02/02/2020 Shingrix Vaccine: No  Outpatient Encounter Medications as of 10/11/2021  Medication Sig   acetaminophen (TYLENOL) 325 MG tablet Take 2 tablets (650 mg total) by mouth every 6 (six) hours as needed for mild pain (or Fever >/= 101).   aspirin EC 81 MG tablet Take 1 tablet (81 mg total) by mouth once daily.   atorvastatin (LIPITOR) 80 MG tablet TAKE ONE TABLET BY MOUTH EVERY DAY AT 6:00PM   gabapentin (NEURONTIN) 100 MG capsule Take 1 capsule (100 mg total) by mouth once daily at bedtime.   metFORMIN (GLUCOPHAGE) 500 MG tablet TAKE 1 TABLET BY MOUTH ONCE DAILY WITH BREAKFAST.   mirtazapine (REMERON) 15 MG  tablet TAKE 1 1/2 TABLETS (22.5MG ) BY MOUTH ONCE DAILY AT BEDTIME.   nicotine (NICODERM CQ - DOSED IN MG/24 HOURS) 21 mg/24hr patch Place 1 patch (21 mg total) onto the skin daily.   estradiol (ESTRACE) 0.1 MG/GM vaginal cream Place vaginally. 2 times per week (Patient not taking: Reported on 10/11/2021)   No facility-administered encounter medications on file as of 10/11/2021.     Assessment and Plan:  Prediabetes: Patient reports being compliant to metformin regimen and self reports an A1c of 5.2%. Last available labs 09/04/21 show an A1c of 5.9% which is down from 6.1% back in 11/03/20. Patient does have a glucometer but does not test since current sugar readings are decreasing. Congratulated patient on the progress of sugar control and encouraged patient to monitor sugar readings and eat a diet rich in fruits and veggies. Paresthesia: Patient reports being compliant to gabapentin regimen. Patient reports that gabapentin takes away the majority of the pain but still has an episode occasionally. Maintains stable on current gabapentin dose. Anxiety/Depression: Patient reports being compliant and stable on current mirtazapine regimen and reports no adverse changes in mood. Hyperlipidemia: Patient reports being compliant and stable on atorvastatin regimen. Last LDL/TC was 75/137 respectively on 11/03/20. Patient is to be seen by PCP next week and should obtain updated labs then.  Return to clinic: 1 year.  Sinclair Ship, PharmD Medication Management Clinic Phone: [phone] 10/11/2021

## 2021-10-12 ENCOUNTER — Other Ambulatory Visit: Payer: Self-pay

## 2021-10-13 ENCOUNTER — Other Ambulatory Visit: Payer: Self-pay

## 2021-10-13 ENCOUNTER — Other Ambulatory Visit: Payer: Self-pay | Admitting: Gerontology

## 2021-10-13 DIAGNOSIS — F418 Other specified anxiety disorders: Secondary | ICD-10-CM

## 2021-10-16 ENCOUNTER — Other Ambulatory Visit: Payer: Self-pay

## 2021-10-17 ENCOUNTER — Other Ambulatory Visit: Payer: Self-pay

## 2021-10-17 ENCOUNTER — Ambulatory Visit: Payer: Self-pay | Admitting: Gerontology

## 2021-10-17 DIAGNOSIS — R202 Paresthesia of skin: Secondary | ICD-10-CM

## 2021-10-17 DIAGNOSIS — E782 Mixed hyperlipidemia: Secondary | ICD-10-CM

## 2021-10-17 DIAGNOSIS — R7303 Prediabetes: Secondary | ICD-10-CM

## 2021-10-17 MED ORDER — ATORVASTATIN CALCIUM 80 MG PO TABS
ORAL_TABLET | ORAL | 1 refills | Status: DC
  Filled 2021-10-17: qty 90, fill #0
  Filled 2021-11-19: qty 90, 90d supply, fill #0
  Filled 2022-02-16 (×2): qty 90, 90d supply, fill #1
  Filled 2022-02-16: qty 30, 30d supply, fill #1
  Filled 2022-03-13: qty 30, 30d supply, fill #0
  Filled 2022-03-13: qty 30, 30d supply, fill #2

## 2021-10-17 MED ORDER — GABAPENTIN 100 MG PO CAPS
100.0000 mg | ORAL_CAPSULE | Freq: Every day | ORAL | 2 refills | Status: DC
  Filled 2021-10-17: qty 30, 30d supply, fill #0
  Filled 2021-11-19: qty 30, 30d supply, fill #1
  Filled 2021-12-17: qty 30, 30d supply, fill #2

## 2021-10-17 MED ORDER — METFORMIN HCL 500 MG PO TABS
ORAL_TABLET | Freq: Every day | ORAL | 1 refills | Status: DC
  Filled 2021-10-17: qty 90, fill #0
  Filled 2021-12-31: qty 90, 90d supply, fill #0
  Filled 2022-03-29: qty 90, 90d supply, fill #1
  Filled 2022-03-30: qty 90, 90d supply, fill #0

## 2021-10-17 MED FILL — Mirtazapine Tab 15 MG: ORAL | 30 days supply | Qty: 45 | Fill #0 | Status: AC

## 2021-10-17 NOTE — Progress Notes (Signed)
Established Patient Office Visit  Subjective:  Patient ID: Natalie Frey, female    DOB: 11/01/1955  Age: 66 y.o. MRN: [id number]  CC: No chief complaint on file.   HPI Natalie Frey is a 66 y/o female with PMH of T2DM, DVT femoral vein, Hyperlipidemia, Stroke presents for follow up visit. She states that she's compliant with her medications and continues to make healthy lifestyle changes. She had Uterine prolapse repair and total vaginal hysterectomy done at [place] on 09/30/21 by Dr Jannifer Franklin- Suezanne Jacquet. She denies urinal incontinence episodes and will follow up on 10/26/21. Her HgbA1c done on 09/04/21 was 5.9%. She states that her mood is good, denies suicidal nor homicidal ideation. Overall, she states that she's doing well and offers no further complaint.  Past Medical History:  Diagnosis Date   Cancer ([place])    bladder (10 years ago, per patient)   Dvt femoral (deep venous thrombosis) ([place])    Hyperlipidemia    Paresthesia    Prediabetes    Stroke [facility])    July 2020, per patient    Past Surgical History:  Procedure Laterality Date   ABDOMINAL HYSTERECTOMY     BLADDER SURGERY     COLONOSCOPY WITH PROPOFOL N/A 02/11/2020   Procedure: COLONOSCOPY WITH PROPOFOL;  Surgeon: Jonathon Bellows, MD;  Location: [facility] ENDOSCOPY;  Service: Gastroenterology;  Laterality: N/A;   DILATION AND EVACUATION      Family History  Problem Relation Age of Onset   Diabetes Mother    Heart disease Mother    Stroke Mother    Mesothelioma Father    Hypertension Sister    Pulmonary embolism Sister        while in hospital; other sister- on BCPs.    Breast cancer Sister 63   Hypertension Sister    Hyperlipidemia Sister    Cancer Sister    Aneurysm Brother        brain   Stroke Brother    Other Brother        unknown medical history   Deep vein thrombosis Daughter         when pregnant     Social History   Socioeconomic History   Marital status: Widowed    Spouse name: Not on file   Number of  children: 2   Years of education: Not on file   Highest education level: High school graduate  Occupational History   Not on file  Tobacco Use   Smoking status: Some Days    Packs/day: 0.25    Years: 47.00    Pack years: 11.75    Types: Cigarettes   Smokeless tobacco: Never   Tobacco comments:    1 pack in 4 days; was able to quit for awhile. Patient stills has an occasional cigarettes, but  uses the Nicoderm patches prn  Vaping Use   Vaping Use: Never used  Substance and Sexual Activity   Alcohol use: Not Currently    Comment: last use 11/2020   Drug use: Not Currently    Types: Marijuana, Cocaine    Comment: last use 11/14/20 (marijuana)   Sexual activity: Not Currently  Other Topics Concern   Not on file  Social History Narrative   [place]; smoking; 1ppd/cutting down. Social. Worked for [employer] from [place].       Said she has been doing ok and does not need any assistance          - is currently on food  stamps but those have been enough       NO consent form signed    Social Determinants of Health   Financial Resource Strain: Not on file  Food Insecurity: No Food Insecurity   Worried About Charity fundraiser in the Last Year: Never true   Ran Out of Food in the Last Year: Never true  Transportation Needs: No Transportation Needs   Lack of Transportation (Medical): No   Lack of Transportation (Non-Medical): No  Physical Activity: Not on file  Stress: Not on file  Social Connections: Not on file  Intimate Partner Violence: Not on file    Outpatient Medications Prior to Visit  Medication Sig Dispense Refill   acetaminophen (TYLENOL) 325 MG tablet Take 2 tablets (650 mg total) by mouth every 6 (six) hours as needed for mild pain (or Fever >/= 101).     aspirin EC 81 MG tablet Take 1 tablet (81 mg total) by mouth once daily. 30 tablet 3   estradiol (ESTRACE) 0.1 MG/GM vaginal cream Place vaginally. 2 times per week (Patient not taking: Reported on  10/11/2021)     mirtazapine (REMERON) 15 MG tablet TAKE 1 1/2 TABLETS (22.5MG ) BY MOUTH ONCE DAILY AT BEDTIME. 45 tablet 2   nicotine (NICODERM CQ - DOSED IN MG/24 HOURS) 21 mg/24hr patch Place 1 patch (21 mg total) onto the skin daily. 28 patch 1   atorvastatin (LIPITOR) 80 MG tablet TAKE ONE TABLET BY MOUTH EVERY DAY AT 6:00PM 90 tablet 1   gabapentin (NEURONTIN) 100 MG capsule Take 1 capsule (100 mg total) by mouth once daily at bedtime. 30 capsule 2   metFORMIN (GLUCOPHAGE) 500 MG tablet TAKE 1 TABLET BY MOUTH ONCE DAILY WITH BREAKFAST. 90 tablet 1   No facility-administered medications prior to visit.    No Known Allergies  ROS Review of Systems  Constitutional: Negative.   Eyes: Negative.   Respiratory: Negative.    Cardiovascular: Negative.   Endocrine: Negative.   Neurological: Negative.   Psychiatric/Behavioral: Negative.       Objective:    Physical Exam Telephone visit There were no vitals taken for this visit. Wt Readings from Last 3 Encounters:  05/25/21 184 lb 4.8 oz (83.6 kg)  04/25/21 183 lb 9.6 oz (83.3 kg)  02/23/21 177 lb 11.2 oz (80.6 kg)     Health Maintenance Due  Topic Date Due   Hepatitis C Screening  Never done   TETANUS/TDAP  Never done   Zoster Vaccines- Shingrix (1 of 2) Never done   COVID-19 Vaccine (3 - Pfizer risk series) 02/02/2020   Pneumococcal Vaccine 86-22 Years old (2 - PCV) 06/25/2020   INFLUENZA VACCINE  05/01/2021   URINE MICROALBUMIN  11/03/2021    There are no preventive care reminders to display for this patient.  Lab Results  Component Value Date   TSH 1.840 04/17/2018   Lab Results  Component Value Date   WBC 8.0 06/24/2019   HGB 15.7 06/24/2019   HCT 45.7 06/24/2019   MCV 93 06/24/2019   PLT 255 06/24/2019   Lab Results  Component Value Date   NA 141 06/02/2020   K 3.9 06/02/2020   CO2 22 06/02/2020   GLUCOSE 124 (H) 06/02/2020   BUN 12 06/02/2020   CREATININE 0.75 06/02/2020   BILITOT 0.8 06/02/2020    ALKPHOS 72 06/02/2020   AST 17 06/02/2020   ALT 18 06/02/2020   PROT 7.0 06/02/2020   ALBUMIN 4.3 06/02/2020   CALCIUM 9.1  06/02/2020   ANIONGAP 10 04/27/2019   Lab Results  Component Value Date   CHOL 137 11/03/2020   Lab Results  Component Value Date   HDL 44 11/03/2020   Lab Results  Component Value Date   LDLCALC 75 11/03/2020   Lab Results  Component Value Date   TRIG 99 11/03/2020   Lab Results  Component Value Date   CHOLHDL 3.1 11/03/2020   Lab Results  Component Value Date   HGBA1C 6.1 05/25/2021      Assessment & Plan:  1. Mixed hyperlipidemia - She will continue current medication, low fat/cholesterol diet and exercise as tolerated. - atorvastatin (LIPITOR) 80 MG tablet; TAKE ONE TABLET BY MOUTH EVERY DAY AT 6:00PM  Dispense: 90 tablet; Refill: 1  2. Paresthesia of left leg - She will continue current medication and was advised to notify clinic with worsening symptoms. - gabapentin (NEURONTIN) 100 MG capsule; Take 1 capsule (100 mg total) by mouth once daily at bedtime.  Dispense: 30 capsule; Refill: 2  3. Prediabetes - Her HgbA1c has improved at 5.9%, she will continue on current medication, low carb/non concentrated sweet diet and exercise as tolerated. - metFORMIN (GLUCOPHAGE) 500 MG tablet; TAKE 1 TABLET BY MOUTH ONCE DAILY WITH BREAKFAST.  Dispense: 90 tablet; Refill: 1     Follow-up: Return in about 3 months (around 01/17/2022), or if symptoms worsen or fail to improve.    Anyelina Claycomb Jerold Coombe, NP

## 2021-10-26 ENCOUNTER — Other Ambulatory Visit: Payer: Self-pay

## 2021-10-26 MED ORDER — MYRBETRIQ 50 MG PO TB24
50.0000 mg | ORAL_TABLET | Freq: Every day | ORAL | 11 refills | Status: DC
  Filled 2022-01-18 – 2022-05-13 (×2): qty 90, 90d supply, fill #0

## 2021-10-27 ENCOUNTER — Other Ambulatory Visit: Payer: Self-pay

## 2021-11-02 ENCOUNTER — Ambulatory Visit: Payer: Self-pay | Admitting: Licensed Clinical Social Worker

## 2021-11-02 ENCOUNTER — Other Ambulatory Visit: Payer: Self-pay

## 2021-11-02 DIAGNOSIS — F4323 Adjustment disorder with mixed anxiety and depressed mood: Secondary | ICD-10-CM

## 2021-11-02 NOTE — BH Specialist Note (Signed)
Integrated Behavioral Health via Telemedicine Visit  11/02/2021 DALTON MILLE [id number]  Total time: 72  Referring [place], NP  Patient/Family location: The patient's home address [facility] Provider location: [facility] [place] All persons participating in visit: Meaghann Choo and Jerrilyn Cairo, LCSW-A  Types of Service: Telephone visit  I connected with [name] via  Telephone or Video Enabled Tel-medicine Application  (Video is Caregility application) and verified that I am speaking with the correct person using two identifiers. Discussed confidentiality: Yes   I discussed the limitations of telemedicine and the availability of in person appointments.  Discussed there is a possibility of technology failure and discussed alternative modes of communication if that failure occurs.  Patient and/or legal guardian expressed understanding and consented to Telemedicine visit: Yes   Presenting Concerns: Patient and/or family reports the following symptoms/concerns: The patient reports that she has been doing well since her last follow-up appointment. She explained that she has been resting at home recovering from her surgery for the last month and has become bored. She explained that her boyfriend offered to take her with him on deliveries so she could get out of the house and that has really helped boost her mood. Kandance noted that everything went as expected with her procedure and she will be retuning to work by next week. She discussed having some mild dread and mild anxiety about returning to work.She discussed her work environment and struggles associated with working with the public.  The patient reports she continues to take Mirtazapine 15 MG daily as prescribed and has refills available to her. Latoya stated that she continues to build her relationship with her daughter and is looking forward to traveling this spring. Sherolyn denied any suicidal or homicidal  thoughts. Duration of problem: Years; Severity of problem: moderate  Patient and/or Family's Strengths/Protective Factors: Social connections, Concrete supports in place (healthy food, safe environments, etc.), and Sense of purpose  Goals Addressed: Patient will:  Reduce symptoms of: agitation, anxiety, depression, and stress   Increase knowledge and/or ability of: coping skills, healthy habits, self-management skills, and stress reduction   Demonstrate ability to: Increase healthy adjustment to current life circumstances and Increase adequate support systems for patient/family  Progress towards Goals: Ongoing  Interventions: Interventions utilized:  CBT Cognitive Behavioral Therapy was utilized by the clinician during today's follow up session. Clinician met with patient to identify needs related to stressors and functioning, and assess and monitor for signs and symptoms of anxiety and depression, and assess safety. The clinician processed with the patient how they have been doing since the last follow-up session. Clinician measured the patient's anxiety and depression on a numerical scale.The clinician encouraged the patient to continue utilize their coping skills to deal with their current life circumstances and incorporate self care into her daily routine. Clinician normalized feelings and anxiousness returning to work after a prolonged break and explained that these feeling usually pass shortly after returning to work. The session ended with scheduling.  Standardized Assessments completed: GAD-7 and PHQ 9 GAD-7 = 5 PHQ-9 = 2  Assessment: Patient currently experiencing see above.   Patient may benefit from see above.  Plan: Follow up with behavioral health clinician on : 12/05/2021 at 3:00 PM  Behavioral recommendations:  Referral(s): [place] (In Clinic)  I discussed the assessment and treatment plan with the patient and/or parent/guardian. They were  provided an opportunity to ask questions and all were answered. They agreed with the plan  and demonstrated an understanding of the instructions.   They were advised to call back or seek an in-person evaluation if the symptoms worsen or if the condition fails to improve as anticipated.  Lesli Albee, LCSWA

## 2021-11-03 ENCOUNTER — Telehealth: Payer: Self-pay | Admitting: Pharmacist

## 2021-11-03 NOTE — Telephone Encounter (Signed)
11/03/2021 11:29:09 AM - Myrbetriq 50mg  to dr. Marland Kitchen pt -- Natalie Frey - Friday, November 03, 2021 11:25 AM --  Received printout from pharmacy for Myrbetriq 50mg . Forms mailed to dr & pt for signature. (Dr. Teodoro Kil @ [facility] in [place]). Also mailed pt [phone] Recertification application to complete requesting POI & taxes.

## 2021-11-10 ENCOUNTER — Other Ambulatory Visit: Payer: Self-pay | Admitting: Gerontology

## 2021-11-10 DIAGNOSIS — E782 Mixed hyperlipidemia: Secondary | ICD-10-CM

## 2021-11-10 MED ORDER — ASPIRIN EC 81 MG PO TBEC
81.0000 mg | DELAYED_RELEASE_TABLET | Freq: Every day | ORAL | 3 refills | Status: AC
  Filled 2021-11-10: qty 30, 30d supply, fill #0
  Filled 2021-12-17: qty 90, 90d supply, fill #1

## 2021-11-12 MED FILL — Mirtazapine Tab 15 MG: ORAL | 30 days supply | Qty: 45 | Fill #1 | Status: AC

## 2021-11-13 ENCOUNTER — Other Ambulatory Visit: Payer: Self-pay

## 2021-11-15 ENCOUNTER — Other Ambulatory Visit: Payer: Self-pay

## 2021-11-16 ENCOUNTER — Ambulatory Visit (INDEPENDENT_AMBULATORY_CARE_PROVIDER_SITE_OTHER): Payer: Self-pay | Admitting: Dermatology

## 2021-11-16 ENCOUNTER — Other Ambulatory Visit: Payer: Self-pay

## 2021-11-16 DIAGNOSIS — L821 Other seborrheic keratosis: Secondary | ICD-10-CM

## 2021-11-16 DIAGNOSIS — L820 Inflamed seborrheic keratosis: Secondary | ICD-10-CM

## 2021-11-16 DIAGNOSIS — L609 Nail disorder, unspecified: Secondary | ICD-10-CM

## 2021-11-16 MED ORDER — GENTAMICIN SULFATE 0.3 % OP SOLN
OPHTHALMIC | 0 refills | Status: DC
  Filled 2021-11-16: qty 5, 30d supply, fill #0

## 2021-11-16 NOTE — Progress Notes (Signed)
° °  New Patient Visit  Subjective  Natalie Frey is a 66 y.o. female who presents for the following: Irregular skin lesions (On the abdomen and inframammary areas - patient is concerned and would like them checked today. ). Pt has noticed a green discoloration on her R thumb after trauma at work and would like area checked today.   The following portions of the chart were reviewed this encounter and updated as appropriate:   Tobacco   Allergies   Meds   Problems   Med Hx   Surg Hx   Fam Hx       Review of Systems:  No other skin or systemic complaints except as noted in HPI or Assessment and Plan.  Objective  Well appearing patient in no apparent distress; mood and affect are within normal limits.  A focused examination was performed including the trunk. Relevant physical exam findings are noted in the Assessment and Plan.  Abdomen, back, inframammary Erythematous stuck-on, waxy papule or plaque  R thumb R thumb nail plate with green discoloration.    Assessment & Plan  Inflamed seborrheic keratosis Abdomen, back, inframammary  Symptomatic but Patient defers tx today. Benign-appearing.  Observation.  Call clinic for new or changing lesions.    Nail problem R thumb  Green discoloration suggestive of pseudomonas. Start Gentamicin sol to aa twice a day - it not improved or resolved in one month RTC.   gentamicin (GARAMYCIN) 0.3 % ophthalmic solution - R thumb Apply to aa R thumb nail QD. If not resolved or improved in one month return to clinic.   Seborrheic Keratoses - Stuck-on, waxy, tan-brown papules and/or plaques  - Benign-appearing - Discussed benign etiology and prognosis. - Observe - Call for any changes   Return if symptoms worsen or fail to improve.  Luther Redo, CMA, am acting as scribe for Forest Gleason, MD .  Documentation: I have reviewed the above documentation for accuracy and completeness, and I agree with the above.  Forest Gleason, MD

## 2021-11-16 NOTE — Patient Instructions (Addendum)
Recommend taking Heliocare sun protection supplement daily in sunny weather for additional sun protection. For maximum protection on the sunniest days, you can take up to 2 capsules of regular Heliocare OR take 1 capsule of Heliocare Ultra. For prolonged exposure (such as a full day in the sun), you can repeat your dose of the supplement 4 hours after your first dose. Heliocare can be purchased at [employer], at some [facility] or at [email or web address].     If You Need Anything After Your Visit  If you have any questions or concerns for your doctor, please call our main line at [phone] and press option 4 to reach your doctor's medical assistant. If no one answers, please leave a voicemail as directed and we will return your call as soon as possible. Messages left after 4 pm will be answered the following business day.   You may also send [place] a message via [place]. We typically respond to MyChart messages within 1-2 business days.  For prescription refills, please ask your pharmacy to contact our office. Our fax number is [phone].  If you have an urgent issue when the clinic is closed that cannot wait until the next business day, you can page your doctor at the number below.    Please note that while we do our best to be available for urgent issues outside of office hours, we are not available 24/7.   If you have an urgent issue and are unable to reach [place], you may choose to seek medical care at your doctor's office, retail clinic, urgent care center, or emergency room.  If you have a medical emergency, please immediately call 911 or go to the emergency department.  Pager Numbers  - Dr. Nehemiah Massed: [phone]  - Dr. Laurence Ferrari: [phone]  - Dr. Nicole Kindred: [phone]  In the event of inclement weather, please call our main line at [phone] for an update on the status of any delays or closures.  Dermatology Medication Tips: Please keep the boxes that topical medications  come in in order to help keep track of the instructions about where and how to use these. Pharmacies typically print the medication instructions only on the boxes and not directly on the medication tubes.   If your medication is too expensive, please contact our office at [phone] option 4 or send [place] a message through [place].   We are unable to tell what your co-pay for medications will be in advance as this is different depending on your insurance coverage. However, we may be able to find a substitute medication at lower cost or fill out paperwork to get insurance to cover a needed medication.   If a prior authorization is required to get your medication covered by your insurance company, please allow [place] 1-2 business days to complete this process.  Drug prices often vary depending on where the prescription is filled and some pharmacies may offer cheaper prices.  The website www.goodrx.com contains coupons for medications through different pharmacies. The prices here do not account for what the cost may be with help from insurance (it may be cheaper with your insurance), but the website can give you the price if you did not use any insurance.  - You can print the associated coupon and take it with your prescription to the pharmacy.  - You may also stop by our office during regular business hours and pick up a GoodRx coupon card.  - If you need your prescription sent electronically to a different pharmacy, notify  our office through [facility] or by phone at [phone] option 4.     Si Usted Necesita Algo Despus de Su Visita  Tambin puede enviarnos un mensaje a travs de Pharmacist, community. Por lo general respondemos a los mensajes de MyChart en el transcurso de 1 a 2 das hbiles.  Para renovar recetas, por favor pida a su farmacia que se ponga en contacto con nuestra oficina. Harland Dingwall de fax es [place] [phone].  Si tiene un asunto urgente cuando la clnica est cerrada y que no  puede esperar hasta el siguiente da hbil, puede llamar/localizar a su doctor(a) al nmero que aparece a continuacin.   Por favor, tenga en cuenta que aunque hacemos todo lo posible para estar disponibles para asuntos urgentes fuera del horario de [place], no estamos disponibles las 24 horas del da, los 7 das de la [place].   Si tiene un problema urgente y no puede comunicarse con nosotros, puede optar por buscar atencin mdica  en el consultorio de su doctor(a), en una clnica privada, en un centro de atencin urgente o en una sala de emergencias.  Si tiene Engineering geologist, por favor llame inmediatamente al 911 o vaya a la sala de emergencias.  Nmeros de bper  - Dr. Nehemiah Massed: [phone]  - Dra. Moye: [phone]  - Dra. Nicole Kindred: [phone]  En caso de inclemencias del [place], por favor llame a Johnsie Kindred principal al [phone] para una actualizacin sobre el [place] de cualquier retraso o cierre.  Consejos para la medicacin en dermatologa: Por favor, guarde las cajas en las que vienen los medicamentos de uso tpico para ayudarle a seguir las instrucciones sobre dnde y cmo usarlos. Las farmacias generalmente imprimen las instrucciones del medicamento slo en las cajas y no directamente en los tubos del [place].   Si su medicamento es muy caro, por favor, pngase en contacto con Zigmund Daniel llamando al [phone] y presione la opcin 4 o envenos un mensaje a travs de Pharmacist, community.   No podemos decirle cul ser su copago por los medicamentos por adelantado ya que esto es diferente dependiendo de la cobertura de su seguro. Sin embargo, es posible que podamos encontrar un medicamento sustituto a Electrical engineer un formulario para que el seguro cubra el medicamento que se considera necesario.   Si se requiere una autorizacin previa para que su compaa de seguros [place] su medicamento, por favor permtanos de 1 a 2 das hbiles para completar este  proceso.  Los precios de los medicamentos varan con frecuencia dependiendo del Environmental consultant de dnde se surte la receta y alguna farmacias pueden ofrecer precios ms baratos.  El sitio web www.goodrx.com tiene cupones para medicamentos de Airline pilot. Los precios aqu no tienen en cuenta lo que podra costar con la ayuda del seguro (puede ser ms barato con su seguro), pero el sitio web puede darle el precio si no utiliz Research scientist (physical sciences).  - Puede imprimir el cupn correspondiente y llevarlo con su receta a la farmacia.  - Tambin puede pasar por nuestra oficina durante el horario de atencin regular y Charity fundraiser una tarjeta de cupones de GoodRx.  - Si necesita que su receta se enve electrnicamente a una farmacia diferente, informe a nuestra oficina a travs de MyChart de [place] o por telfono llamando al [phone] y presione la opcin 4.

## 2021-11-17 ENCOUNTER — Other Ambulatory Visit: Payer: Self-pay

## 2021-11-20 ENCOUNTER — Other Ambulatory Visit: Payer: Self-pay

## 2021-11-24 ENCOUNTER — Telehealth: Payer: Self-pay | Admitting: Acute Care

## 2021-11-24 NOTE — Telephone Encounter (Signed)
Contacted patient to schedule annual LDCT.  Patient says she cannot schedule now but has our number and will call us when ready

## 2021-11-27 ENCOUNTER — Encounter: Payer: Self-pay | Admitting: Dermatology

## 2021-11-27 NOTE — Addendum Note (Signed)
Addended by: Alfonso Patten on: 11/27/2021 10:23 PM   Modules accepted: Level of Service

## 2021-12-05 ENCOUNTER — Ambulatory Visit: Payer: Self-pay | Admitting: Licensed Clinical Social Worker

## 2021-12-06 ENCOUNTER — Ambulatory Visit
Admission: EM | Admit: 2021-12-06 | Discharge: 2021-12-06 | Disposition: A | Discharge status: Home / Self Care | Payer: Self-pay | Attending: Emergency Medicine | Admitting: Emergency Medicine

## 2021-12-06 ENCOUNTER — Other Ambulatory Visit: Payer: Self-pay

## 2021-12-06 DIAGNOSIS — R220 Localized swelling, mass and lump, head: Secondary | ICD-10-CM

## 2021-12-06 DIAGNOSIS — K047 Periapical abscess without sinus: Secondary | ICD-10-CM

## 2021-12-06 MED ORDER — AMOXICILLIN-POT CLAVULANATE 875-125 MG PO TABS: 1.0000 | ORAL_TABLET | Freq: Two times a day (BID) | ORAL | 0 refills | Status: DC

## 2021-12-06 NOTE — ED Provider Notes (Signed)
?[facility] ? ? ? ?CSN: [id number] ?Arrival date & time: 12/06/21  1411 ? ? ?  ? ?History   ?Chief Complaint ?Chief Complaint  ?Patient presents with  ? Facial Swelling  ? ? ?HPI ?Natalie Frey is a 66 y.o. female.  ? ?Patient presents with burning with tooth swelling painful without facial swelling.  Patient has a history of dental caries.  Patient states she does not have health insurance and is not able to see a dentist.  Has not taken anything prior to arrival for pain.  Denies any fever no nausea vomiting or diarrhea ? ? ?Past Medical History:  ?Diagnosis Date  ? Cancer [facility])   ? bladder (10 years ago, per patient)  ? Dvt femoral (deep venous thrombosis) ([place])   ? Hyperlipidemia   ? Paresthesia   ? Prediabetes   ? Stroke [facility])   ? July 2020, per patient  ? ? ?Patient Active Problem List  ? Diagnosis Date Noted  ? Paresthesia of left leg 04/25/2021  ? Skin mole 04/25/2021  ? History of DVT of lower extremity 02/28/2021  ? Intermittent claudication ([place]) 02/23/2021  ? Ganglion cyst of wrist 02/23/2021  ? Prediabetes 12/08/2019  ? Health care maintenance 10/05/2019  ? Urinary leakage 09/02/2019  ? Elevated blood pressure reading 05/06/2019  ? Ischemic stroke diagnosed during current admission ([place]) 05/05/2019  ? Anxiety about health 05/05/2019  ? Paresthesia of left arm and leg 04/29/2019  ? Chronic deep vein thrombosis (DVT) of femoral vein of left lower extremity ([place]) 03/26/2019  ? Leg swelling 03/17/2019  ? Abnormal laboratory test 03/17/2019  ? Type 2 diabetes mellitus without complications ([place]) 14/48/1856  ? Smoking 01/18/2018  ? DVT (deep venous thrombosis) ([place]) 12/23/2017  ? ? ?Past Surgical History:  ?Procedure Laterality Date  ? ABDOMINAL HYSTERECTOMY    ? BLADDER SURGERY    ? COLONOSCOPY WITH PROPOFOL N/A 02/11/2020  ? Procedure: COLONOSCOPY WITH PROPOFOL;  Surgeon: Jonathon Bellows, MD;  Location: [facility] ENDOSCOPY;  Service: Gastroenterology;  Laterality: N/A;  ? DILATION AND EVACUATION    ? ? ?OB  History   ?No obstetric history on file. ?  ? ? ? ?Home Medications   ? ?Prior to Admission medications   ?Medication Sig Start Date End Date Taking? Authorizing Provider  ?amoxicillin-clavulanate (AUGMENTIN) 875-125 MG tablet Take 1 tablet by mouth every 12 (twelve) hours. 12/06/21  Yes Marney Setting, NP  ?acetaminophen (TYLENOL) 325 MG tablet Take 2 tablets (650 mg total) by mouth every 6 (six) hours as needed for mild pain (or Fever >/= 101). 12/24/17   Gouru, Aruna, MD  ?aspirin EC 81 MG tablet Take 1 tablet (81 mg total) by mouth once daily. 11/10/21   Iloabachie, Chioma E, NP  ?atorvastatin (LIPITOR) 80 MG tablet TAKE ONE TABLET BY MOUTH ONCE EVERY DAY AT 6:00PM 10/17/21 10/17/22  Iloabachie, Chioma E, NP  ?estradiol (ESTRACE) 0.1 MG/GM vaginal cream Place vaginally. 2 times per week ?Patient not taking: Reported on 10/11/2021 05/12/20   [provider]  ?gabapentin (NEURONTIN) 100 MG capsule Take 1 capsule (100 mg total) by mouth once daily at bedtime. 10/17/21   Iloabachie, Chioma E, NP  ?gentamicin (GARAMYCIN) 0.3 % ophthalmic solution Apply to aa R thumb nail QD. If not resolved or improved in one month return to clinic. 11/16/21   Moye, [place], MD  ?metFORMIN (GLUCOPHAGE) 500 MG tablet TAKE 1 TABLET BY MOUTH ONCE DAILY WITH BREAKFAST. 10/17/21 10/17/22  Iloabachie, Chioma E, NP  ?mirabegron  ER (MYRBETRIQ) 50 MG TB24 tablet Take 1 tablet (50 mg total) by mouth once daily. 10/26/21     ?mirtazapine (REMERON) 15 MG tablet TAKE 1 1/2 TABLETS (22.'5MG'$ ) BY MOUTH ONCE DAILY AT BEDTIME. 10/17/21 07/06/22  Iloabachie, Chioma E, NP  ?nicotine (NICODERM CQ - DOSED IN MG/24 HOURS) 21 mg/24hr patch Place 1 patch (21 mg total) onto the skin daily. 06/14/20   Iloabachie, Chioma E, NP  ? ? ?Family History ?Family History  ?Problem Relation Age of Onset  ? Diabetes Mother   ? Heart disease Mother   ? Stroke Mother   ? Mesothelioma Father   ? Hypertension Sister   ? Pulmonary embolism Sister   ?     while in hospital;  other sister- on BCPs.   ? Breast cancer Sister 76  ? Hypertension Sister   ? Hyperlipidemia Sister   ? Cancer Sister   ? Aneurysm Brother   ?     brain  ? Stroke Brother   ? Other Brother   ?     unknown medical history  ? Deep vein thrombosis Daughter   ?      when pregnant   ? ? ?Social History ?Social History  ? ?Tobacco Use  ? Smoking status: Some Days  ?  Packs/day: 0.25  ?  Years: 47.00  ?  Pack years: 11.75  ?  Types: Cigarettes  ? Smokeless tobacco: Never  ? Tobacco comments:  ?  1 pack in 4 days; was able to quit for awhile. Patient stills has an occasional cigarettes, but  uses the Nicoderm patches prn  ?Vaping Use  ? Vaping Use: Never used  ?Substance Use Topics  ? Alcohol use: Not Currently  ?  Comment: last use 11/2020  ? Drug use: Not Currently  ?  Types: Marijuana, Cocaine  ?  Comment: last use 11/14/20 (marijuana)  ? ? ? ?Allergies   ?Patient has no known allergies. ? ? ?Review of Systems ?Review of Systems  ?Constitutional:  Negative for chills and fever.  ?HENT:  Positive for dental problem and facial swelling. Negative for postnasal drip, sinus pressure, sinus pain, sneezing and sore throat.   ?Respiratory: Negative.    ?Cardiovascular: Negative.   ?Neurological: Negative.   ? ? ?Physical Exam ?Triage Vital Signs ?ED Triage Vitals  ?Enc Vitals Group  ?   BP 12/06/21 1425 136/76  ?   Pulse Rate 12/06/21 1425 93  ?   Resp 12/06/21 1425 18  ?   Temp 12/06/21 1425 98.8 ?F (37.1 ?C)  ?   Temp Source 12/06/21 1425 Oral  ?   SpO2 12/06/21 1425 98 %  ?   Weight 12/06/21 1421 180 lb (81.6 kg)  ?   Height 12/06/21 1421 '5\' 4"'$  (1.626 m)  ?   Head Circumference --   ?   Peak Flow --   ?   Pain Score 12/06/21 1421 4  ?   Pain Loc --   ?   Pain Edu? --   ?   Excl. in [place]? --   ? ?No data found. ? ?Updated Vital Signs ?BP 136/76 (BP Location: Left Arm)   Pulse 93   Temp 98.8 ?F (37.1 ?C) (Oral)   Resp 18   Ht '5\' 4"'$  (1.626 m)   Wt 180 lb (81.6 kg)   SpO2 98%   BMI 30.90 kg/m?  ? ?Visual Acuity ?Right Eye  Distance:   ?Left Eye Distance:   ?Bilateral Distance:   ? ?  Right Eye Near:   ?Left Eye Near:    ?Bilateral Near:    ? ?Physical Exam ?Constitutional:   ?   Appearance: Normal appearance.  ?HENT:  ?   Right Ear: Tympanic membrane normal.  ?   Left Ear: Tympanic membrane normal.  ?   Nose: Nose normal.  ?   Mouth/Throat:  ?   Dentition: Dental tenderness, gingival swelling, dental caries and dental abscesses present.  ?   Comments: Multiple dental caries throughout top and bottom indentation ?Eyes:  ?   Pupils: Pupils are equal, round, and reactive to light.  ?Neurological:  ?   Mental Status: She is alert.  ? ? ? ?UC Treatments / Results  ?Labs ?(all labs ordered are listed, but only abnormal results are displayed) ?Labs Reviewed - No data to display ? ?EKG ? ? ?Radiology ?No results found. ? ?Procedures ?Procedures (including critical care time) ? ?Medications Ordered in UC ?Medications - No data to display ? ?Initial Impression / Assessment and Plan / UC Course  ?I have reviewed the triage vital signs and the nursing notes. ? ?Pertinent labs & imaging results that were available during my care of the patient were reviewed by me and considered in my medical decision making (see chart for details). ? ?  ? ?Take Tylenol Motrin as needed for pain ?Patient to take full dose of antibiotics ?Make sure to flush or irrigate pocket of infected tooth after eating ?Call the dental school in the next few days to see if he can possibly get an appointment ?Final Clinical Impressions(s) / UC Diagnoses  ? ?Final diagnoses:  ?Dental abscess  ?Facial swelling  ?Dental infection  ? ? ? ?Discharge Instructions   ? ?  ?Take Tylenol Motrin as needed for pain ?Patient to take full dose of antibiotics ?Make sure to flush or irrigate pocket of infected tooth after eating ? ? ? ? ?ED Prescriptions   ? ? Medication Sig Dispense Auth. Provider  ? amoxicillin-clavulanate (AUGMENTIN) 875-125 MG tablet Take 1 tablet by mouth every 12 (twelve)  hours. 14 tablet Marney Setting, NP  ? ?  ? ?PDMP not reviewed this encounter. ?  ?Marney Setting, NP ?12/06/21 1442 ? ?

## 2021-12-06 NOTE — Discharge Instructions (Addendum)
Take Tylenol Motrin as needed for pain ?Patient to take full dose of antibiotics ?Make sure to flush or irrigate pocket of infected tooth after eating ? ?

## 2021-12-06 NOTE — ED Triage Notes (Signed)
Pt has broken tooth bottom left dentition. Reports two days ago started with facial swelling and when lying down her right face and right ear hurt.  ?

## 2021-12-07 ENCOUNTER — Other Ambulatory Visit: Payer: Self-pay

## 2021-12-12 ENCOUNTER — Ambulatory Visit: Payer: Self-pay | Admitting: Licensed Clinical Social Worker

## 2021-12-13 ENCOUNTER — Ambulatory Visit: Payer: Self-pay | Admitting: Pharmacy Technician

## 2021-12-13 ENCOUNTER — Ambulatory Visit: Payer: Self-pay | Admitting: Licensed Clinical Social Worker

## 2021-12-13 ENCOUNTER — Other Ambulatory Visit: Payer: Self-pay

## 2021-12-13 DIAGNOSIS — Z79899 Other long term (current) drug therapy: Secondary | ICD-10-CM

## 2021-12-13 DIAGNOSIS — F4323 Adjustment disorder with mixed anxiety and depressed mood: Secondary | ICD-10-CM

## 2021-12-13 NOTE — BH Specialist Note (Signed)
Integrated Behavioral Health via Telemedicine Visit ? ?12/13/2021 ?[name] ?[id number] ? ?Number of [place] Clinician visits: No data recorded ?Session Start time: No data recorded  ?Session End time: No data recorded ?Total time in minutes: No data recorded ? ?Referring Provider: Carlyon Shadow, NP  ?Patient/Family location: The patient's home address ?[facility] Provider location: [facility] [place] ?All persons participating in visit: Natalie Frey and Natalie Frey, MSW, LCSW-A ?Types of Service: Telephone visit ? ?I connected with Natalie Frey Telephone or Video Enabled Telemedicine Application  (Video is Caregility application) and verified that I am speaking with the correct person using two identifiers. Discussed confidentiality: Yes  ? ?I discussed the limitations of telemedicine and the availability of in person appointments.  Discussed there is a possibility of technology failure and discussed alternative modes of communication if that failure occurs. ? ?Patient and/or legal guardian expressed understanding and consented to Telemedicine visit: Yes  ? ?Presenting Concerns: ?Patient and/or family reports the following symptoms/concerns: The patient reports that she has been doing worse since her last follow-up visit. 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 She noted her employer told her that they never received the required paperwork from her physician. Natalie Frey explained that she tried to reason with her employer but they refused to help her. The patient shared that she contacted her doctor and they showed her that they had sent the paperwork in to the company. Natalie Frey explained that the company has more power and money than her and she can not fight them so she filed for unemployment. The patient discussed financial stressors impacting her life and concerns over not being able to pay her bills.  Natalie Frey noted her daughter who lives out of state helped her cover her bills last month. The patient discussed her boyfriend was also hospitalized last month due to illness. Natalie Frey noted that staying positive has helped. 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 The patient denied any suicidal or homicidal thoughts.  ?Duration of problem: Years; Severity of problem: moderate ? ?Patient and/or Family's Strengths/Protective Factors: ?Concrete supports in place (healthy food, safe environments, etc.) ? ?Goals Addressed: ?Patient will: ? Reduce symptoms of: agitation, anxiety, depression, insomnia, and stress  ? Increase knowledge and/or ability of: coping skills, healthy habits, self-management skills, and stress reduction  ? Demonstrate ability to: Increase healthy adjustment to current life circumstances and Increase adequate support systems for patient/family ? ?Progress towards Goals: ?Ongoing ? ?Interventions: ?Interventions utilized:  Supportive Counseling was utilized by the clinician focusing on the patient's stress during today's follow up session. Clinician met with patient to identify needs related to stressors and functioning, and assess and monitor for signs and symptoms of anxiety and depression. The clinician processed with the patient how they have been doing since the last follow-up session. Clinician measured the patient's anxiety and depression on a numerical scale. Clinician encouraged the patient to focus on the positives in her life verses the negatives.   Clinician processed with the patient regarding how she has been doing since the last follow up session. Clinician explained to the patient that despite the set backs that she recently experienced she did have help from her family to make it through. Clinician encouraged the patient to practice self care and take things one day at a time.   ?Standardized Assessments  completed: GAD-7 and PHQ 9 ?GAD-7 =  5 ?PHQ-9=  2 ? ?Assessment: ?Patient currently experiencing see above.  ? ?Patient may benefit from see above. ? ?Plan: ?Follow up with behavioral health clinician on : 01/04/2022 at 12:00 noon ?Behavioral recommendations:  ?Referral(s): [place] (In Clinic) ? ?I discussed the assessment and treatment plan with the patient and/or parent/guardian. They were provided an opportunity to ask questions and all were answered. They agreed with the plan and demonstrated an understanding of the instructions. ?  ?They were advised to call back or seek an in-person evaluation if the symptoms worsen or if the condition fails to improve as anticipated. ? ?Natalie Frey, LCSWA ?

## 2021-12-13 NOTE — Progress Notes (Signed)
Assisted patient with re-certification.  Received updated proof of income.  Patient eligible to receive medication assistance at [facility] until time for re-certification in 2024, and as long as eligibility requirements continue to be met. ? ?Lonzell Dorris J. Sundiata Ferrick ?Care Manager ?[facility]  ? ?

## 2021-12-14 ENCOUNTER — Telehealth: Payer: Self-pay | Admitting: Pharmacy Technician

## 2021-12-14 MED FILL — Mirtazapine Tab 15 MG: ORAL | 30 days supply | Qty: 45 | Fill #2 | Status: AC

## 2021-12-14 NOTE — Telephone Encounter (Signed)
Patient called requesting Myrbetriq PAP application be faxed to provider's office in [place].  I explained that we would attempt to fax the application, but sometimes faxing applications between clinics causes the content of the application to become distorted.  If the application is distorted then [employer] will not process it.  Patient stated that she understood and is still requesting that we fax the application to her provider.  Patient to obtain fax number from provider and contact [facility] with that number. ? ?Natalie Frey ?Care Manager ?[facility] ?

## 2021-12-15 ENCOUNTER — Other Ambulatory Visit: Payer: Self-pay

## 2021-12-18 ENCOUNTER — Other Ambulatory Visit: Payer: Self-pay

## 2022-01-01 ENCOUNTER — Other Ambulatory Visit: Payer: Self-pay

## 2022-01-04 ENCOUNTER — Other Ambulatory Visit: Payer: Self-pay

## 2022-01-04 ENCOUNTER — Telehealth: Payer: Self-pay | Admitting: Pharmacist

## 2022-01-04 ENCOUNTER — Ambulatory Visit: Payer: Self-pay | Admitting: Licensed Clinical Social Worker

## 2022-01-04 ENCOUNTER — Other Ambulatory Visit: Payer: Self-pay | Admitting: Gerontology

## 2022-01-04 DIAGNOSIS — F4323 Adjustment disorder with mixed anxiety and depressed mood: Secondary | ICD-10-CM

## 2022-01-04 DIAGNOSIS — F418 Other specified anxiety disorders: Secondary | ICD-10-CM

## 2022-01-04 DIAGNOSIS — R202 Paresthesia of skin: Secondary | ICD-10-CM

## 2022-01-04 MED ORDER — GABAPENTIN 100 MG PO CAPS
100.0000 mg | ORAL_CAPSULE | Freq: Every day | ORAL | 2 refills | Status: DC
  Filled 2022-01-04 – 2022-01-15 (×2): qty 30, 30d supply, fill #0
  Filled 2022-03-13: qty 30, 30d supply, fill #1
  Filled 2022-03-13: qty 30, 30d supply, fill #0
  Filled 2022-04-16: qty 30, 30d supply, fill #1

## 2022-01-04 MED ORDER — MIRTAZAPINE 15 MG PO TABS
ORAL_TABLET | Freq: Every evening | ORAL | 2 refills | Status: DC
  Filled 2022-01-04: qty 45, fill #0
  Filled 2022-01-13: qty 45, 30d supply, fill #0
  Filled 2022-02-11: qty 45, 30d supply, fill #1
  Filled 2022-03-13: qty 45, 30d supply, fill #2
  Filled 2022-03-13: qty 45, 30d supply, fill #0

## 2022-01-04 NOTE — BH Specialist Note (Signed)
Integrated Behavioral Health via Telemedicine Visit ? ?01/04/2022 ?[name] ?[id number] ? ? ?Referring Provider: Carlyon Shadow, NP  ?Patient/Family location: The patient's home  ?[facility] Provider location: [facility] [place] ?All persons participating in visit: Kyrah B. Estill Cotta and [employer], LCSW-A  ?Types of Service: Telephone visit ? ?I connected with Natalie Frey via  Telephone or Video Enabled Telemedicine Application  (Video is Caregility application) and verified that I am speaking with the correct person using two identifiers. Discussed confidentiality: Yes  ? ?I discussed the limitations of telemedicine and the availability of in person appointments. Discussed there is a possibility of technology failure and discussed alternative modes of communication if that failure occurs. ? ?Patient and/or legal guardian expressed understanding and consented to Telemedicine visit: Yes  ? ?Presenting Concerns: ?Patient and/or family reports the following symptoms/concerns: The patient reports that she has been doing okay since her last follow-up appointment. The patient stated that she continues to take mirtazapine 22.5 MG at bedtime and has no problems with sleep. She shared that she will need a refill of Mirtazapine and Gabapentin soon and requested the clinician message her primary care provider to let them know. 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 She noted that her unemployment benefits never came through, but she is going to continue to work on getting her benefits. The patient discussed financial and health stressors impacting her life. She stated that she was riding with her boyfriend on his deliveries and staying busy with household chores this past month. The patient noted that she has been taking things one day at a time and feels she is doing well overall. She shared that talks to her daughter and family weekly and that helps. Lilana denied any suicidal or  homicidal thoughts.  ?Duration of problem: Years ; Severity of problem: moderate ? ?Patient and/or Family's Strengths/Protective Factors: ?Social connections, Concrete supports in place (healthy food, safe environments, etc.), and Sense of purpose ? ?Goals Addressed: ?Patient will: ? Reduce symptoms of: agitation, anxiety, depression, insomnia, and stress  ? Increase knowledge and/or ability of: coping skills, healthy habits, self-management skills, and stress reduction  ? Demonstrate ability to: Increase healthy adjustment to current life circumstances and Increase adequate support systems for patient/family ? ?Progress towards Goals: ?Ongoing ? ?Interventions: ?Interventions utilized:  CBT Cognitive Behavioral Therapy was utilized by the clinician during today's follow up session. Clinician met with patient to identify needs related to stressors and functioning, and assess and monitor for signs and symptoms of anxiety and depression, and assess safety. The clinician processed with the patient how they have been doing since the last follow-up session. Clinician measured the anxiety and depression on a numerical scale. The clinician encouraged the patient to utilize their coping skills to deal with their current life circumstances. Clinician messaged the patient's primary care provider and relayed the patients medication refill requests for mirtazapine and gabapentin. The session ended with scheduling.  ?Standardized Assessments completed: GAD-7 and PHQ 9 ?GAD-7= 5 ?PHQ-9= 2 ? ?Assessment: ?Patient currently experiencing see above .  ? ?Patient may benefit from above. ? ?Plan: ?Follow up with behavioral health clinician on : 02/07/2022 at 8:00 AM  ?Behavioral recommendations:  ?Referral(s): [place] (In Clinic) ? ?I discussed the assessment and treatment plan with the patient and/or parent/guardian. They were provided an opportunity to ask questions and all were answered. They agreed  with the plan and demonstrated an understanding of the  instructions. ?  ?They were advised to call back or seek an in-person evaluation if the symptoms worsen or if the condition fails to improve as anticipated. ? ?Lesli Albee, LCSWA ?

## 2022-01-04 NOTE — Telephone Encounter (Signed)
01/04/2022 10:59:15 AM - Myrbetriq fax to [facility] ?-- Arletha Pili - Thursday, January 04, 2022 10:57 AM --Faxed PAP enrollment to [facility] for Myrbetriq 50 mg. ?

## 2022-01-15 ENCOUNTER — Other Ambulatory Visit: Payer: Self-pay

## 2022-01-17 ENCOUNTER — Ambulatory Visit: Payer: Self-pay | Admitting: Gerontology

## 2022-01-18 ENCOUNTER — Other Ambulatory Visit: Payer: Self-pay

## 2022-02-01 ENCOUNTER — Other Ambulatory Visit: Payer: Self-pay

## 2022-02-07 ENCOUNTER — Ambulatory Visit: Payer: Self-pay | Admitting: Licensed Clinical Social Worker

## 2022-02-07 DIAGNOSIS — F4323 Adjustment disorder with mixed anxiety and depressed mood: Secondary | ICD-10-CM

## 2022-02-07 NOTE — BH Specialist Note (Signed)
Integrated Behavioral Health Follow Up In-Person Visit  MRN: [id number] Name: Natalie Frey   Types of Service: Telephone visit  Interpretor:No. Interpretor Name and Language: N/A  Subjective: Natalie Frey is a 66 y.o. female accompanied by  herself Patient was referred by Natalie Shadow, NP  for mental health . Patient reports the following symptoms/concerns: The patient reported that she has been doing well since her last follow-u appointment. Telitha reports that she continues to take mirtazapine  22.5 MG nighty as prescribed, and noted satisfaction with the quality and quantify of her sleep. She denied any concerns regarding anxiety or depression symptoms and reports she has been doing well overall. 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 She noted that she works later in the evening now, but enjoys being able to sleep in later. She shared that she is doing well on her job and enjoys her work. She noted that her car recently broke down and she was grateful her boyfriend was able to repair it for her. She noted that she working to improve her relationship with her daughters and grandchildren. She shared that they often do not see eye to eye, but remaining mindful of what is in her control verses what is not has been very helpful. Natalie Frey denied any suicidal or homicidal thoughts.  Duration of problem: Years; Severity of problem: moderate  Objective: Mood: Euthymic and Affect: Appropriate Risk of harm to self or others: No plan to harm self or others  Life Context: Family and Social: see above  School/Work: see above Self-Care: see above Life Changes: see above  Patient and/or Family's Strengths/Protective Factors: Social connections, Concrete supports in place (healthy food, safe environments, etc.), and Sense of purpose  Goals Addressed: Patient will:  Reduce symptoms of: agitation, anxiety, depression, and stress   Increase  knowledge and/or ability of: coping skills, healthy habits, self-management skills, and stress reduction   Demonstrate ability to: Increase healthy adjustment to current life circumstances  Progress towards Goals: Ongoing  Interventions: Interventions utilized:  CBT Cognitive Behavioral Therapy was utilized by the clinician during today's follow up session. Clinician met with patient to identify needs related to stressors and functioning, and assess and monitor for signs and symptoms of anxiety and depression, and assess safety. The clinician processed with the patient how they have been doing since the last follow-up session. Clinician measured the patient anxiety and depression on a numerical scale. Clinician congratulated the patient for doing well and using her coping skills to deal with the stressors present in her life. Clinician explored what the patient found helpful in dealing with situational stressors in her life. Clinician conducted a check in with the patient to evaluate her adherence to and contentment with the psychotropic medications prescribed by her provider. During this session, the patient reports that she takes mirtazapine 22.5 MG nightly as prescribed, resulting in continued improvement in her sleep quality. Clinician conducted a check in with the patient to evaluate her adherence to and contentment with the psychotropic medications prescribed by her provider. During this session, the patient reports that she takes mirtazapine 22.5 MG nightly as prescribed, resulting in continued improvement in her sleep quality. The session ended with scheduling.  Standardized Assessments completed: GAD-7 and PHQ 9 GAD-7= 2 PHQ-9= 1  Assessment: Patient currently experiencing see above.   Patient may benefit from see above.  Plan: Follow up with behavioral health clinician on : 03/28/2022  at 9:00 AM Behavioral recommendations:  Referral(s): [place] (In  Clinic) "From scale of 1-10, how likely are you to follow plan?":   Natalie Frey, LCSWA

## 2022-02-12 ENCOUNTER — Other Ambulatory Visit: Payer: Self-pay

## 2022-02-13 ENCOUNTER — Other Ambulatory Visit: Payer: Self-pay

## 2022-02-16 ENCOUNTER — Other Ambulatory Visit: Payer: Self-pay

## 2022-03-13 ENCOUNTER — Other Ambulatory Visit: Payer: Self-pay

## 2022-03-20 ENCOUNTER — Other Ambulatory Visit: Payer: Self-pay

## 2022-03-28 ENCOUNTER — Ambulatory Visit: Payer: Self-pay | Admitting: Licensed Clinical Social Worker

## 2022-03-30 ENCOUNTER — Other Ambulatory Visit: Payer: Self-pay

## 2022-04-04 ENCOUNTER — Ambulatory Visit: Payer: Self-pay | Admitting: Gerontology

## 2022-04-04 ENCOUNTER — Other Ambulatory Visit: Payer: Self-pay

## 2022-04-06 ENCOUNTER — Other Ambulatory Visit: Payer: Self-pay

## 2022-04-12 ENCOUNTER — Other Ambulatory Visit: Payer: Self-pay

## 2022-04-12 ENCOUNTER — Encounter: Payer: Self-pay | Admitting: Gerontology

## 2022-04-12 ENCOUNTER — Ambulatory Visit: Payer: Self-pay | Admitting: Gerontology

## 2022-04-12 VITALS — BP 133/83 | HR 74 | Temp 98.3°F | Resp 16 | Ht 64.0 in | Wt 183.4 lb

## 2022-04-12 DIAGNOSIS — R7303 Prediabetes: Secondary | ICD-10-CM

## 2022-04-12 DIAGNOSIS — R4589 Other symptoms and signs involving emotional state: Secondary | ICD-10-CM

## 2022-04-12 DIAGNOSIS — E782 Mixed hyperlipidemia: Secondary | ICD-10-CM

## 2022-04-12 DIAGNOSIS — R030 Elevated blood-pressure reading, without diagnosis of hypertension: Secondary | ICD-10-CM

## 2022-04-12 DIAGNOSIS — F418 Other specified anxiety disorders: Secondary | ICD-10-CM

## 2022-04-12 LAB — POCT GLYCOSYLATED HEMOGLOBIN (HGB A1C): Hemoglobin A1C: 5.9 % — AB (ref 4.0–5.6)

## 2022-04-12 LAB — GLUCOSE, POCT (MANUAL RESULT ENTRY): POC Glucose: 146 mg/dl — AB (ref 70–99)

## 2022-04-12 MED ORDER — MIRTAZAPINE 15 MG PO TABS
ORAL_TABLET | Freq: Every evening | ORAL | 2 refills | Status: DC
  Filled 2022-04-12: qty 45, 30d supply, fill #0
  Filled 2022-05-13: qty 45, 30d supply, fill #1
  Filled 2022-06-13: qty 45, 30d supply, fill #2

## 2022-04-12 MED ORDER — METFORMIN HCL 500 MG PO TABS
ORAL_TABLET | Freq: Every day | ORAL | 1 refills | Status: DC
  Filled 2022-04-12: qty 90, fill #0
  Filled 2022-06-29: qty 90, 90d supply, fill #0

## 2022-04-12 MED ORDER — ATORVASTATIN CALCIUM 40 MG PO TABS
ORAL_TABLET | ORAL | 1 refills | Status: DC
  Filled 2022-04-12: qty 60, 30d supply, fill #0
  Filled 2022-05-13: qty 60, 30d supply, fill #1
  Filled 2022-06-17: qty 52, 26d supply, fill #2
  Filled 2022-07-12: qty 60, 30d supply, fill #3

## 2022-04-12 NOTE — Progress Notes (Signed)
Established Patient Office Visit  Subjective   Patient ID: Natalie Frey, female    DOB: 1956-01-24  Age: 66 y.o. MRN: [id number]  Chief Complaint  Patient presents with   Hospitalization Follow-up    Patient seen at [facility] ED 04/11/22 for lightheadedness and elevated BP.    HPI  Natalie Frey is a 66 y/o female with PMH of T2DM, DVT femoral vein, Hyperlipidemia, Stroke presents for follow up visit. She was seen at [facility] ED on 04/11/22 for elevated blood pressure and lightheadedness, was treated with IV fluid infusion. Her blood pressure reading was 133/83 during visit.  She states that she is compliant with her medications, denies side effects and continues to make healthy lifestyle changes.  She denies dizziness, chest pain, palpitation and vision changes.  Her hemoglobin A1c was 5.9%, she denies hypo-/hyperglycemic symptoms, peripheral neuropathy and performs daily foot checks. She states that her mood is good, denies suicidal nor homicidal ideation. Overall, she states that she is doing well and offers no further complaint.  Review of Systems  Constitutional: Negative.   HENT: Negative.    Eyes: Negative.   Respiratory: Negative.    Cardiovascular: Negative.   Skin: Negative.   Neurological: Negative.   Psychiatric/Behavioral: Negative.        Objective:     BP 133/83 (BP Location: Right Arm, Patient Position: Sitting, Cuff Size: Large)   Pulse 74   Temp 98.3 F (36.8 C) (Oral)   Resp 16   Ht '5\' 4"'$  (1.626 m)   Wt 183 lb 6.4 oz (83.2 kg)   SpO2 98%   BMI 31.48 kg/m  BP Readings from Last 3 Encounters:  04/12/22 133/83  12/06/21 136/76  05/25/21 129/80   Wt Readings from Last 3 Encounters:  04/12/22 183 lb 6.4 oz (83.2 kg)  12/06/21 180 lb (81.6 kg)  05/25/21 184 lb 4.8 oz (83.6 kg)      Physical Exam HENT:     Head: Normocephalic and atraumatic.     Mouth/Throat:     Mouth: Mucous membranes are moist.  Eyes:     Extraocular Movements: Extraocular  movements intact.     Conjunctiva/sclera: Conjunctivae normal.     Pupils: Pupils are equal, round, and reactive to light.  Cardiovascular:     Rate and Rhythm: Normal rate and regular rhythm.     Pulses: Normal pulses.     Heart sounds: Normal heart sounds.  Pulmonary:     Effort: Pulmonary effort is normal.     Breath sounds: Normal breath sounds.  Skin:    General: Skin is warm.  Neurological:     General: No focal deficit present.     Mental Status: She is alert and oriented to person, place, and time. Mental status is at baseline.  Psychiatric:        Mood and Affect: Mood normal.        Behavior: Behavior normal.        Thought Content: Thought content normal.        Judgment: Judgment normal.      Results for orders placed or performed in visit on 04/12/22  POCT Glucose (CBG)  Result Value Ref Range   POC Glucose 146 (A) 70 - 99 mg/dl  POCT HgB A1C  Result Value Ref Range   Hemoglobin A1C 5.9 (A) 4.0 - 5.6 %   HbA1c POC (<> result, manual entry)     HbA1c, POC (prediabetic range)     HbA1c,  POC (controlled diabetic range)      Last CBC Lab Results  Component Value Date   WBC 8.0 06/24/2019   HGB 15.7 06/24/2019   HCT 45.7 06/24/2019   MCV 93 06/24/2019   MCH 31.8 06/24/2019   RDW 12.4 06/24/2019   PLT 255 84/66/5993   Last metabolic panel Lab Results  Component Value Date   GLUCOSE 124 (H) 06/02/2020   NA 141 06/02/2020   K 3.9 06/02/2020   CL 105 06/02/2020   CO2 22 06/02/2020   BUN 12 06/02/2020   CREATININE 0.75 06/02/2020   GFRNONAA 86 06/02/2020   CALCIUM 9.1 06/02/2020   PROT 7.0 06/02/2020   ALBUMIN 4.3 06/02/2020   LABGLOB 2.7 06/02/2020   AGRATIO 1.6 06/02/2020   BILITOT 0.8 06/02/2020   ALKPHOS 72 06/02/2020   AST 17 06/02/2020   ALT 18 06/02/2020   ANIONGAP 10 04/27/2019   Last lipids Lab Results  Component Value Date   CHOL 137 11/03/2020   HDL 44 11/03/2020   LDLCALC 75 11/03/2020   TRIG 99 11/03/2020   CHOLHDL 3.1  11/03/2020   Last hemoglobin A1c Lab Results  Component Value Date   HGBA1C 5.9 (A) 04/12/2022      The ASCVD Risk score (Arnett DK, et al., 2019) failed to calculate for the following reasons:   The patient has a prior MI or stroke diagnosis    Assessment & Plan:   1. Prediabetes -Her hemoglobin A1c was 5.9%, she will continue current medication, advised to continue on low carbohydrate/non concentrated sweet diet and exercise as tolerated. - POCT HgB A1C; Future - POCT Glucose (CBG); Future - POCT Glucose (CBG) - POCT HgB A1C - metFORMIN (GLUCOPHAGE) 500 MG tablet; TAKE 1 TABLET BY MOUTH ONCE DAILY WITH BREAKFAST.  Dispense: 90 tablet; Refill: 1 - Urine Microalbumin w/creat. ratio; Future  2. Mixed hyperlipidemia -She will continue current medication, low-fat/cholesterol diet and exercise as tolerated. - atorvastatin (LIPITOR) 40 MG tablet; TAKE TWO TABLETS BY MOUTH ONCE EVERY DAY AT 6:00PM  Dispense: 180 tablet; Refill: 1  3. Anxiety about health -She will continue on current medication and follow-up with behavioral health team.  She was advised to call the crisis helpline with worsening symptoms. - mirtazapine (REMERON) 15 MG tablet; TAKE 1 1/2 TABLETS (22.'5MG'$ ) BY MOUTH ONCE NIGHTLY AT BEDTIME.  Dispense: 45 tablet; Refill: 2  4. Elevated blood pressure reading -Her blood pressure was 133/83 during visit, she was advised to continue checking blood pressure every other day, notify clinic if consistently greater than 140/90.  She was educated on the signs and symptoms of stroke and advised to go to the emergency room.   Return in about 13 weeks (around 07/12/2022), or if symptoms worsen or fail to improve.    Paddy Walthall Jerold Coombe, NP

## 2022-04-12 NOTE — Patient Instructions (Signed)

## 2022-04-13 ENCOUNTER — Other Ambulatory Visit: Payer: Self-pay

## 2022-04-16 ENCOUNTER — Other Ambulatory Visit: Payer: Self-pay

## 2022-04-17 ENCOUNTER — Telehealth: Payer: Self-pay | Admitting: Gerontology

## 2022-04-17 NOTE — Telephone Encounter (Signed)
-----   Message from Langston Reusing, NP sent at 04/12/2022  2:02 PM EDT ----- Pls schedule a telephone visit with Ms Natalie Frey 1st week of September @ 8:30 am and notify patient. Thank you

## 2022-04-17 NOTE — Telephone Encounter (Signed)
I left a voicemail about calling back to schedule a telephone visit for the 1st week of Set

## 2022-05-13 ENCOUNTER — Other Ambulatory Visit: Payer: Self-pay | Admitting: Gerontology

## 2022-05-13 ENCOUNTER — Other Ambulatory Visit: Payer: Self-pay

## 2022-05-13 DIAGNOSIS — R202 Paresthesia of skin: Secondary | ICD-10-CM

## 2022-05-14 ENCOUNTER — Other Ambulatory Visit: Payer: Self-pay

## 2022-05-15 ENCOUNTER — Other Ambulatory Visit: Payer: Self-pay | Admitting: Gerontology

## 2022-05-15 ENCOUNTER — Other Ambulatory Visit: Payer: Self-pay

## 2022-05-15 DIAGNOSIS — N3281 Overactive bladder: Secondary | ICD-10-CM

## 2022-05-15 MED ORDER — MIRABEGRON ER 50 MG PO TB24
50.0000 mg | ORAL_TABLET | Freq: Every day | ORAL | 11 refills | Status: AC
  Filled 2022-05-15 – 2022-11-07 (×4): qty 30, 30d supply, fill #0

## 2022-05-15 MED FILL — Gabapentin Cap 100 MG: ORAL | 30 days supply | Qty: 30 | Fill #0 | Status: AC

## 2022-05-16 ENCOUNTER — Other Ambulatory Visit: Payer: Self-pay

## 2022-05-16 ENCOUNTER — Other Ambulatory Visit: Payer: Self-pay | Admitting: Pharmacy Technician

## 2022-05-16 NOTE — Patient Outreach (Signed)
Patient stated that she is receiving this medication at her home.  Upset that I had called her to inquire about the medication.  Does not want [facility] to write prescription for this medication.  Prefers to obtain this medication from [facility] provider.  Made patient aware that we cannot do PAP for [facility] providers.  When her PAP application expires, she will need to work with her [facility] provider to complete the new one.  Patient verbally acknowledged that she understood.  Jacquelynn Cree Patient Advocate Specialist [place]

## 2022-06-12 ENCOUNTER — Ambulatory Visit: Payer: Self-pay | Admitting: Licensed Clinical Social Worker

## 2022-06-13 ENCOUNTER — Other Ambulatory Visit: Payer: Self-pay

## 2022-06-13 MED FILL — Gabapentin Cap 100 MG: ORAL | 30 days supply | Qty: 30 | Fill #1 | Status: AC

## 2022-06-17 ENCOUNTER — Other Ambulatory Visit: Payer: Self-pay

## 2022-06-18 ENCOUNTER — Other Ambulatory Visit: Payer: Self-pay

## 2022-06-29 ENCOUNTER — Other Ambulatory Visit: Payer: Self-pay

## 2022-06-29 MED FILL — Gabapentin Cap 100 MG: ORAL | 30 days supply | Qty: 30 | Fill #2 | Status: CN

## 2022-07-12 ENCOUNTER — Other Ambulatory Visit: Payer: Self-pay

## 2022-07-12 ENCOUNTER — Ambulatory Visit: Payer: Self-pay | Admitting: Gerontology

## 2022-07-12 ENCOUNTER — Other Ambulatory Visit: Payer: Self-pay | Admitting: Gerontology

## 2022-07-12 DIAGNOSIS — F418 Other specified anxiety disorders: Secondary | ICD-10-CM

## 2022-07-12 MED FILL — Gabapentin Cap 100 MG: ORAL | 30 days supply | Qty: 30 | Fill #2 | Status: AC

## 2022-07-13 ENCOUNTER — Other Ambulatory Visit: Payer: Self-pay | Admitting: Gerontology

## 2022-07-13 ENCOUNTER — Other Ambulatory Visit: Payer: Self-pay

## 2022-07-13 DIAGNOSIS — F418 Other specified anxiety disorders: Secondary | ICD-10-CM

## 2022-07-15 ENCOUNTER — Other Ambulatory Visit: Payer: Self-pay

## 2022-07-16 ENCOUNTER — Other Ambulatory Visit: Payer: Self-pay

## 2022-07-16 MED FILL — Mirtazapine Tab 15 MG: ORAL | 30 days supply | Qty: 45 | Fill #0 | Status: AC

## 2022-07-17 ENCOUNTER — Ambulatory Visit: Payer: Self-pay | Admitting: Licensed Clinical Social Worker

## 2022-07-17 ENCOUNTER — Encounter: Payer: Self-pay | Admitting: Licensed Clinical Social Worker

## 2022-07-17 ENCOUNTER — Other Ambulatory Visit: Payer: Self-pay

## 2022-07-17 DIAGNOSIS — F4323 Adjustment disorder with mixed anxiety and depressed mood: Secondary | ICD-10-CM

## 2022-07-17 NOTE — BH Specialist Note (Signed)
Integrated Behavioral Health via Telemedicine Visit  07/17/2022 Natalie Frey [id number]  Number of [place] Clinician visits: No data recorded Session Start time: No data recorded  Session End time: No data recorded Total time in minutes: No data recorded  Referring Provider: Carlyon Shadow, NP  Patient/Family location: The Patient's Home Address [facility] Provider location: [facility] [place] All persons participating in visit: Natalie Frey and Jerrilyn Cairo , LCSW-A Types of Service: Telephone visit  I connected with Natalie Frey via  Telephone or Video Enabled Telemedicine Application  (Video is Caregility application) and verified that I am speaking with the correct person using two identifiers. Discussed confidentiality: Yes   I discussed the limitations of telemedicine and the availability of in person appointments.  Discussed there is a possibility of technology failure and discussed alternative modes of communication if that failure occurs.  Patient and/or legal guardian expressed understanding and consented to Telemedicine visit: Yes   Presenting Concerns: Patient and/or family reports the following symptoms/concerns: The patient reports that she has been doing well since her last follow-up session.  Natalie Frey explained that she had some difficulty obtaining a refill for her mirtazapine and began to take half a dose for the past 3 days; however, she noted that it is available for pickup today at her pharmacy. The patient stated that she has enjoyed an overall okay mood and denied any sleep disturbances, appetite changes, or depression symptoms.  Natalie Frey discussed an increase in work-related and family related stressors that are impacting her life currently.  She shared that she uses her coping skills including deep breathing to help her manage difficult moments in her life.  Natalie Frey shared that she is looking forward to an upcoming trip back to her hometown in [place] for her daughter's wedding and spending time with her sisters and friends that she has not seen in over a year. The patient denied any suicidal or homicidal thoughts. Duration of problem: Years; Severity of problem: moderate  Patient and/or Family's Strengths/Protective Factors: Social connections, Concrete supports in place (healthy food, safe environments, etc.), Sense of purpose, and Physical Health (exercise, healthy diet, medication compliance, etc.)  Goals Addressed: Patient will:  Reduce symptoms of: agitation, anxiety, depression, insomnia, and stress   Increase knowledge and/or ability of: coping skills, healthy habits, self-management skills, and stress reduction   Demonstrate ability to: Increase healthy adjustment to current life circumstances  Progress towards Goals: Ongoing  Interventions: Interventions utilized:  CBT Cognitive Behavioral Therapywas utilized by the clinician during today's follow up session. Clinician met with patient to identify needs related to stressors and functioning, and assess and monitor for signs and symptoms of anxiety and depression, and assess safety. The clinician processed with the patient how they have been doing since the last follow-up session. Clinician measured the patient's anxiety and depression on an numerical scale.  Clinician intervened with positive regard and optimism to validate client's emotions, and supported client in exploring ways to decrease stressors in her life.  Clinician offered to message the patient's primary care provider regarding her recent difficulty obtaining her refill for mirtazapine.  The session ended in scheduling. Standardized Assessments completed: GAD-7 and PHQ 9 PHQ-9= 1 GAD-7= 2  Assessment: Patient currently experiencing see above.   Patient may benefit from see above.  Plan: Follow up with behavioral health clinician on : August 21, 2022 at 9:00 AM  Behavioral recommendations:  Referral(s):  [place] (In Clinic)  I  discussed the assessment and treatment plan with the patient and/or parent/guardian. They were provided an opportunity to ask questions and all were answered. They agreed with the plan and demonstrated an understanding of the instructions.   They were advised to call back or seek an in-person evaluation if the symptoms worsen or if the condition fails to improve as anticipated.  Lesli Albee, LCSWA

## 2022-07-19 ENCOUNTER — Other Ambulatory Visit: Payer: Self-pay

## 2022-07-19 ENCOUNTER — Ambulatory Visit: Payer: Self-pay | Admitting: Gerontology

## 2022-07-19 ENCOUNTER — Encounter: Payer: Self-pay | Admitting: Gerontology

## 2022-07-19 VITALS — BP 146/90 | HR 75 | Temp 98.4°F | Resp 16 | Ht 64.0 in | Wt 194.0 lb

## 2022-07-19 DIAGNOSIS — R202 Paresthesia of skin: Secondary | ICD-10-CM

## 2022-07-19 DIAGNOSIS — E782 Mixed hyperlipidemia: Secondary | ICD-10-CM

## 2022-07-19 DIAGNOSIS — R7303 Prediabetes: Secondary | ICD-10-CM

## 2022-07-19 DIAGNOSIS — R030 Elevated blood-pressure reading, without diagnosis of hypertension: Secondary | ICD-10-CM

## 2022-07-19 LAB — POCT GLYCOSYLATED HEMOGLOBIN (HGB A1C): Hemoglobin A1C: 6.4 % — AB (ref 4.0–5.6)

## 2022-07-19 LAB — GLUCOSE, POCT (MANUAL RESULT ENTRY): POC Glucose: 132 mg/dl — AB (ref 70–99)

## 2022-07-19 MED ORDER — GABAPENTIN 100 MG PO CAPS
100.0000 mg | ORAL_CAPSULE | Freq: Every day | ORAL | 2 refills | Status: DC
  Filled 2022-07-19 – 2022-08-15 (×3): qty 30, 30d supply, fill #0
  Filled 2022-09-14: qty 30, 30d supply, fill #1
  Filled 2022-10-08 – 2022-11-07 (×2): qty 30, 30d supply, fill #2

## 2022-07-19 MED ORDER — ATORVASTATIN CALCIUM 40 MG PO TABS
ORAL_TABLET | ORAL | 1 refills | Status: AC
  Filled 2022-07-19: qty 180, fill #0
  Filled 2022-08-08: qty 180, 90d supply, fill #0
  Filled 2022-11-07: qty 180, 90d supply, fill #1

## 2022-07-19 MED ORDER — METFORMIN HCL 1000 MG PO TABS
1000.0000 mg | ORAL_TABLET | Freq: Every day | ORAL | 1 refills | Status: DC
  Filled 2022-07-19 – 2022-08-08 (×2): qty 90, 90d supply, fill #0

## 2022-07-19 NOTE — Patient Instructions (Signed)

## 2022-07-19 NOTE — Progress Notes (Signed)
Established Patient Office Visit  Subjective   Patient ID: Natalie Frey, female    DOB: July 27, 1956  Age: 66 y.o. MRN: [id number]  Chief Complaint  Patient presents with   Follow-up   Prediabetes    HPI Natalie Frey is a 65 y/o female with PMH of T2DM, DVT femoral vein, Hyperlipidemia, Stroke presents for follow up visit and lab review. Her HgbA1c checked during visit increased from 5.9% to 6.4%. She states that she's compliant with her medications, denies side effects and continues to make healthy lifestyle changes. Her blood pressure was elevated during visit and was 146/90 when rechecked. She denies chest pain, palpitation, shortness of breath, weakness and vision changes. Overall, she states that she's doing well and offers no further complaint.   Patient Active Problem List   Diagnosis Date Noted   Paresthesia of left leg 04/25/2021   Skin mole 04/25/2021   History of DVT of lower extremity 02/28/2021   Intermittent claudication ([place]) 02/23/2021   Ganglion cyst of wrist 02/23/2021   Prediabetes 12/08/2019   Health care maintenance 10/05/2019   Urinary leakage 09/02/2019   Elevated blood pressure reading 05/06/2019   Ischemic stroke diagnosed during current admission [facility]) 05/05/2019   Anxiety about health 05/05/2019   Paresthesia of left arm and leg 04/29/2019   Chronic deep vein thrombosis (DVT) of femoral vein of left lower extremity ([place]) 03/26/2019   Leg swelling 03/17/2019   Abnormal laboratory test 03/17/2019   Type 2 diabetes mellitus without complications ([place]) 81/82/9937   Smoking 01/18/2018   DVT (deep venous thrombosis) ([place]) 12/23/2017   Past Medical History:  Diagnosis Date   Cancer ([place])    bladder (10 years ago, per patient)   Dvt femoral (deep venous thrombosis) ([place])    Hyperlipidemia    Paresthesia    Prediabetes    Stroke [facility])    July 2020, per patient   Past Surgical History:  Procedure Laterality Date   ABDOMINAL HYSTERECTOMY  08/2021    BLADDER SURGERY     COLONOSCOPY WITH PROPOFOL N/A 02/11/2020   Procedure: COLONOSCOPY WITH PROPOFOL;  Surgeon: Jonathon Bellows, MD;  Location: [facility] ENDOSCOPY;  Service: Gastroenterology;  Laterality: N/A;   DILATION AND EVACUATION     Social History   Tobacco Use   Smoking status: Former    Packs/day: 0.25    Years: 47.00    Total pack years: 11.75    Types: Cigarettes    Quit date: 01/2022    Years since quitting: 0.4   Smokeless tobacco: Never   Tobacco comments:    1 pack in 4 days; was able to quit for awhile. Patient stills has an occasional cigarettes, but  uses the Nicoderm patches prn Patient has now quit smoking  Vaping Use   Vaping Use: Never used  Substance Use Topics   Alcohol use: Not Currently    Comment: last use 11/2020   Drug use: Not Currently    Types: Marijuana, Cocaine    Comment: last use 11/14/20 (marijuana)   No Known Allergies    Review of Systems  Constitutional: Negative.   Respiratory: Negative.    Cardiovascular: Negative.   Genitourinary: Negative.   Neurological: Negative.   Endo/Heme/Allergies: Negative.   Psychiatric/Behavioral: Negative.        Objective:     BP (!) 146/90 (BP Location: Right Arm, Patient Position: Sitting, Cuff Size: Large)   Pulse 75   Temp 98.4 F (36.9 C) (Oral)   Resp 16  Ht '5\' 4"'$  (1.626 m)   Wt 194 lb (88 kg)   SpO2 96%   BMI 33.30 kg/m  BP Readings from Last 3 Encounters:  07/19/22 (!) 146/90  04/12/22 133/83  12/06/21 136/76   Wt Readings from Last 3 Encounters:  07/19/22 194 lb (88 kg)  04/12/22 183 lb 6.4 oz (83.2 kg)  12/06/21 180 lb (81.6 kg)    Encouraged weight loss  Physical Exam HENT:     Head: Normocephalic and atraumatic.     Mouth/Throat:     Mouth: Mucous membranes are moist.  Eyes:     Extraocular Movements: Extraocular movements intact.     Conjunctiva/sclera: Conjunctivae normal.     Pupils: Pupils are equal, round, and reactive to light.  Cardiovascular:     Rate and  Rhythm: Normal rate and regular rhythm.     Pulses: Normal pulses.     Heart sounds: Normal heart sounds.  Pulmonary:     Effort: Pulmonary effort is normal.     Breath sounds: Normal breath sounds.  Skin:    General: Skin is warm.  Neurological:     General: No focal deficit present.     Mental Status: She is alert and oriented to person, place, and time. Mental status is at baseline.  Psychiatric:        Mood and Affect: Mood normal.        Behavior: Behavior normal.        Thought Content: Thought content normal.        Judgment: Judgment normal.      Results for orders placed or performed in visit on 07/19/22  POCT HgB A1C  Result Value Ref Range   Hemoglobin A1C 6.4 (A) 4.0 - 5.6 %   HbA1c POC (<> result, manual entry)     HbA1c, POC (prediabetic range)     HbA1c, POC (controlled diabetic range)    POCT Glucose (CBG)  Result Value Ref Range   POC Glucose 132 (A) 70 - 99 mg/dl    Last CBC Lab Results  Component Value Date   WBC 8.0 06/24/2019   HGB 15.7 06/24/2019   HCT 45.7 06/24/2019   MCV 93 06/24/2019   MCH 31.8 06/24/2019   RDW 12.4 06/24/2019   PLT 255 42/68/3419   Last metabolic panel Lab Results  Component Value Date   GLUCOSE 124 (H) 06/02/2020   NA 141 06/02/2020   K 3.9 06/02/2020   CL 105 06/02/2020   CO2 22 06/02/2020   BUN 12 06/02/2020   CREATININE 0.75 06/02/2020   GFRNONAA 86 06/02/2020   CALCIUM 9.1 06/02/2020   PROT 7.0 06/02/2020   ALBUMIN 4.3 06/02/2020   LABGLOB 2.7 06/02/2020   AGRATIO 1.6 06/02/2020   BILITOT 0.8 06/02/2020   ALKPHOS 72 06/02/2020   AST 17 06/02/2020   ALT 18 06/02/2020   ANIONGAP 10 04/27/2019   Last lipids Lab Results  Component Value Date   CHOL 137 11/03/2020   HDL 44 11/03/2020   LDLCALC 75 11/03/2020   TRIG 99 11/03/2020   CHOLHDL 3.1 11/03/2020   Last hemoglobin A1c Lab Results  Component Value Date   HGBA1C 6.4 (A) 07/19/2022   Last thyroid functions Lab Results  Component Value Date    TSH 1.840 04/17/2018      The ASCVD Risk score (Arnett DK, et al., 2019) failed to calculate for the following reasons:   The patient has a prior MI or stroke diagnosis    Assessment &  Plan:    1. Prediabetes - Her HgbA1c was 6.4%, her Metformin was increased to 1000 mg daily, she was encouraged to continue on low carb/non concentrated sweet diet and exercise as tolerated. - POCT HgB A1C - POCT Glucose (CBG) - metFORMIN (GLUCOPHAGE) 1000 MG tablet; Take 1 tablet (1,000 mg total) by mouth daily with breakfast.  Dispense: 90 tablet; Refill: 1 - Vitamin D (25 hydroxy); Future - Urine Microalbumin w/creat. ratio; Future - Basic Metabolic Panel (BMET); Future - Basic Metabolic Panel (BMET) - Vitamin D (25 hydroxy)  2. Mixed hyperlipidemia - She will continue on current medication, low fat/cholesterol diet and exercise as tolerated. - atorvastatin (LIPITOR) 40 MG tablet; TAKE TWO TABLETS BY MOUTH ONCE EVERY DAY AT 6:00PM  Dispense: 180 tablet; Refill: 1 - Lipid panel; Future - Lipid panel  3. Paresthesia of left leg - She will continue on current medication. - gabapentin (NEURONTIN) 100 MG capsule; Take 1 capsule (100 mg total) by mouth once daily at bedtime.  Dispense: 30 capsule; Refill: 2  4. Elevated blood pressure reading - Her blood pressure was 146/90,  she was educated on signs and symptoms of stroke and advised to go to the ED. She was advised to check it 2-3 times weekly, record and notify clinic if SBP > 160/90. She was encouraged to continue on DASH diet, exercise as tolerated.   Return in about 13 weeks (around 10/18/2022), or if symptoms worsen or fail to improve.    Ryeleigh Santore Jerold Coombe, NP

## 2022-07-20 LAB — LIPID PANEL
Chol/HDL Ratio: 3 ratio (ref 0.0–4.4)
Cholesterol, Total: 125 mg/dL (ref 100–199)
HDL: 42 mg/dL (ref >39)
LDL Chol Calc (NIH): 62 mg/dL (ref 0–99)
Triglycerides: 114 mg/dL (ref 0–149)
VLDL Cholesterol Cal: 21 mg/dL (ref 5–40)

## 2022-07-20 LAB — BASIC METABOLIC PANEL
BUN/Creatinine Ratio: 11 — ABNORMAL LOW (ref 12–28)
BUN: 7 mg/dL — ABNORMAL LOW (ref 8–27)
CO2: 24 mmol/L (ref 20–29)
Calcium: 8.9 mg/dL (ref 8.7–10.3)
Chloride: 102 mmol/L (ref 96–106)
Creatinine, Ser: 0.63 mg/dL (ref 0.57–1.00)
Glucose: 129 mg/dL — ABNORMAL HIGH (ref 70–99)
Potassium: 3.6 mmol/L (ref 3.5–5.2)
Sodium: 139 mmol/L (ref 134–144)
eGFR: 100 mL/min/{1.73_m2} (ref >59)

## 2022-07-20 LAB — VITAMIN D 25 HYDROXY (VIT D DEFICIENCY, FRACTURES): Vit D, 25-Hydroxy: 10 ng/mL — ABNORMAL LOW (ref 30.0–100.0)

## 2022-07-22 ENCOUNTER — Other Ambulatory Visit: Payer: Self-pay

## 2022-07-26 ENCOUNTER — Other Ambulatory Visit: Payer: Self-pay

## 2022-07-26 ENCOUNTER — Telehealth: Payer: Self-pay | Admitting: Emergency Medicine

## 2022-07-26 DIAGNOSIS — Z23 Encounter for immunization: Secondary | ICD-10-CM

## 2022-07-26 NOTE — Telephone Encounter (Signed)
Patient c/o increased diarrhea since increasing Metformin to '1000mg'$  daily. Spoke with Benjamine Mola, NP and she recommends taking 1/2 tablet in the morning and 1/2 tablet in the evening. I advised patient of recommendation. Patient at first said she was just going to go back to '500mg'$  daily. I recommended that she try the 1/2 tablet twice daily and she if she could tolerate that. Patient stated that she would try.

## 2022-08-02 NOTE — BH Specialist Note (Deleted)
A user error has taken place: {error:[id number]}.

## 2022-08-03 NOTE — BH Specialist Note (Deleted)
A user error has taken place.

## 2022-08-06 NOTE — Progress Notes (Signed)
This encounter was created in error - please disregard.

## 2022-08-07 ENCOUNTER — Other Ambulatory Visit: Payer: Self-pay

## 2022-08-08 ENCOUNTER — Other Ambulatory Visit: Payer: Self-pay

## 2022-08-15 ENCOUNTER — Other Ambulatory Visit: Payer: Self-pay

## 2022-08-15 MED FILL — Mirtazapine Tab 15 MG: ORAL | 30 days supply | Qty: 45 | Fill #1 | Status: AC

## 2022-08-21 ENCOUNTER — Ambulatory Visit: Payer: Self-pay | Admitting: Licensed Clinical Social Worker

## 2022-08-21 DIAGNOSIS — F4323 Adjustment disorder with mixed anxiety and depressed mood: Secondary | ICD-10-CM

## 2022-08-21 NOTE — BH Specialist Note (Signed)
Integrated Behavioral Health via Telemedicine Visit  08/21/2022 Natalie Frey [id number]  Number of [place] Clinician visits: No data recorded Session Start time: No data recorded  Session End time: No data recorded Total time in minutes: No data recorded  Referring Provider: Carlyon Shadow, NP Patient/Family location: [facility] Provider location: [facility] [place] All persons participating in visit: Natalie Frey. Estill Cotta and Jerrilyn Cairo, LCSW-A Types of Service: Telephone visit  I connected with Natalie Frey  via  Telephone or Video Enabled Telemedicine Application  (Video is Caregility application) and verified that I am speaking with the correct person using two identifiers. Discussed confidentiality: Yes   I discussed the limitations of telemedicine and the availability of in person appointments.  Discussed there is a possibility of technology failure and discussed alternative modes of communication if that failure occurs.  Patient and/or legal guardian expressed understanding and consented to Telemedicine visit: Yes   Presenting Concerns: Patient and/or family reports the following symptoms/concerns patient denied the patient reports that she has been doing okay since her last follow-up session.  Tanica shared that she recently returned from visiting her family out of state.  She explains that she took [place] and although she felt anxious she was able to manage with the stress by distracting herself with activities.  The patient noted that she was glad to be home and that although she loves her family she finds being around them too much to be very stressful.  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  The patient reported that she continues to take mirtazapine 22.5 mg nightly at bedtime exactly as prescribed and is satisfied with the effects of the medication; the  patient denied any adverse or negative side effects from the medication.  Rina noted that she is looking forward to spending the holidays with her boyfriend.  The patient denied any suicidal or homicidal thoughts. Duration of problem: Years; Severity of problem: moderate  Patient and/or Family's Strengths/Protective Factors: Social connections, Concrete supports in place (healthy food, safe environments, etc.), Sense of purpose, and Physical Health (exercise, healthy diet, medication compliance, etc.)  Goals Addressed: Patient will:  Reduce symptoms of: agitation, anxiety, depression, insomnia, and stress   Increase knowledge and/or ability of: coping skills, healthy habits, self-management skills, and stress reduction   Demonstrate ability to: Increase healthy adjustment to current life circumstances  Progress towards Goals: Ongoing  Interventions: Interventions utilized:  CBT Cognitive Behavioral Therapywas utilized by the clinician during today's follow up session. Clinician met with patient to identify needs related to stressors and functioning, and assess and monitor for signs and symptoms of anxiety and depression, and assess safety. The clinician processed with the patient how they have been doing since the last follow-up session.Clinician measured the patient's anxiety and depression on a numerical scale.  The patient is compliant to psychotropic medications prescribed by her primary care provider was monitored by the clinician and the effectiveness of the medication on the patient's level of functioning was noted to be improved.  Clinician determined that the patient reported being satisfied with the effects of the medication.  The clinician encouraged the patient to continue to utilize their coping skills to deal with their current life circumstances.  The session ended with scheduling Standardized Assessments completed: GAD-7 and PHQ 9 GAD-7=10 PHQ-9=01  Patient and/or Family  Response: The patient agreed to continue to  practice her coping skills every day reach [facility] should she need an earlier appointment  Assessment: Patient currently experiencing see above.   Patient may benefit from see above.  Plan: Follow up with behavioral health clinician on : September 13, 2022 at 9:00 AM. Behavioral recommendations:  Referral(s): [place] (In Clinic)  I discussed the assessment and treatment plan with the patient and/or parent/guardian. They were provided an opportunity to ask questions and all were answered. They agreed with the plan and demonstrated an understanding of the instructions.   They were advised to call back or seek an in-person evaluation if the symptoms worsen or if the condition fails to improve as anticipated.  Lesli Albee, LCSWA

## 2022-09-13 ENCOUNTER — Ambulatory Visit: Payer: Self-pay | Admitting: Licensed Clinical Social Worker

## 2022-09-13 DIAGNOSIS — F4323 Adjustment disorder with mixed anxiety and depressed mood: Secondary | ICD-10-CM

## 2022-09-13 NOTE — BH Specialist Note (Signed)
Integrated Behavioral Health via Telemedicine Visit  09/13/2022 JENEANE PIECZYNSKI [id number]  Number of [place] Clinician visits: No data recorded Session Start time: No data recorded  Session End time: No data recorded Total time in minutes: No data recorded  Referring Provider: Carlyon Shadow, NP Patient/Family location: The Patients Address [facility] Provider location: [facility] [place] All persons participating in visit: Clinton Wahlberg. Estill Cotta and Jerrilyn Cairo, LCSWA Types of Service: Telephone visit  I connected with [facility]  Telephone or Video Enabled Telemedicine Application  (Video is Caregility application) and verified that I am speaking with the correct person using two identifiers. Discussed confidentiality: Yes   I discussed the limitations of telemedicine and the availability of in person appointments.  Discussed there is a possibility of technology failure and discussed alternative modes of communication if that failure occurs.  Patient and/or legal guardian expressed understanding and consented to Telemedicine visit: Yes   Presenting Concerns: Patient and/or family reports the following symptoms/concerns: The patient reports that she has been doing about the same since her last follow-up appointment.  Suleima shared that she is still out of work and finds that very stressful at this time of year.  She explained that over Thanksgiving she had an argument with her daughter and they have not spoken since.  Teal discussed other situational stressors that are impacting her life currently.  The patient reported that she continues to take mirtazapine 22.5 mg nightly before bed as prescribed and denied any negative effects.  Catriona shared that she continues to be satisfied that the mirtazapine helps her cope with her anxiety symptoms and denied any sleep disturbances.  Meridith discussed her plans for Christmas and noted she was looking forward to spending the time  with her boyfriend.  Edmonia denied any suicidal or homicidal thoughts. Duration of problem: Years; Severity of problem: moderate  Patient and/or Family's Strengths/Protective Factors: Social connections, Concrete supports in place (healthy food, safe environments, etc.), Sense of purpose, and Physical Health (exercise, healthy diet, medication compliance, etc.)  Goals Addressed: Patient will:  Reduce symptoms of: agitation, anxiety, depression, insomnia, and stress   Increase knowledge and/or ability of: coping skills, healthy habits, self-management skills, and stress reduction   Demonstrate ability to: Increase healthy adjustment to current life circumstances  Progress towards Goals: Ongoing  Interventions: Interventions utilized:  CBT Cognitive Behavioral Therapy was utilized by the clinician during today's follow up session. Clinician met with patient to identify needs related to stressors and functioning, and assess and monitor for signs and symptoms of anxiety and depression, and assess safety. The clinician processed with the patient how they have been doing since the last follow-up session. Clinician measured the patient's anxiety and depression on a numerical scale.  Clinician monitored the patient's compliance with her psychotropic medication that were prescribed by her primary care provider, Carlyon Shadow, NP and determined that the patient takes her medication exactly as prescribed and is satisfied with its effects.  The clinician provided a safe space for the client to ventilate her frustration regarding her current life circumstances and encouraged the patient to continue to use her coping skills daily even during times when she did not feel distressed.  The session ended with scheduling. Standardized Assessments completed: GAD-7 and PHQ 9 GAD-7=10 PHQ-9=2   Assessment: Patient currently experiencing see above.   Patient may benefit from see above.  Plan: Follow up with  behavioral health clinician on : Thursday, October 04, 2022 at 11:00 AM.  Behavioral recommendations:  Referral(s): [place] (In Clinic)  I discussed the assessment and treatment plan with the patient and/or parent/guardian. They were provided an opportunity to ask questions and all were answered. They agreed with the plan and demonstrated an understanding of the instructions.   They were advised to call back or seek an in-person evaluation if the symptoms worsen or if the condition fails to improve as anticipated.  Lesli Albee, LCSWA

## 2022-09-14 ENCOUNTER — Other Ambulatory Visit: Payer: Self-pay

## 2022-09-14 MED FILL — Mirtazapine Tab 15 MG: ORAL | 30 days supply | Qty: 45 | Fill #2 | Status: AC

## 2022-10-04 ENCOUNTER — Ambulatory Visit: Payer: Self-pay | Admitting: Licensed Clinical Social Worker

## 2022-10-04 DIAGNOSIS — F4323 Adjustment disorder with mixed anxiety and depressed mood: Secondary | ICD-10-CM

## 2022-10-04 NOTE — BH Specialist Note (Signed)
Integrated Behavioral Health via Telemedicine Visit  10/04/2022 Natalie Frey [id number]  Number of [place] Clinician visits: No data recorded Session Start time: No data recorded  Session End time: No data recorded Total time in minutes: No data recorded  Referring Provider: Carlyon Shadow, NP  Patient/Family location: [facility] Patients Home  [facility] Provider [facility]: [facility] [place]  All persons participating in visit: Helana Macbride and Jerrilyn Cairo, LCSW-A Types of Service: Telephone visit  I connected with Natalie Frey  via  Telephone or Video Enabled Telemedicine Application  (Video is Caregility application) and verified that I am speaking with the correct person using two identifiers. Discussed confidentiality: Yes   I discussed the limitations of telemedicine and the availability of in person appointments.  Discussed there is a possibility of technology failure and discussed alternative modes of communication if that failure occurs.  Patient and/or legal guardian expressed understanding and consented to Telemedicine visit: Yes   Presenting Concerns: Patient and/or family reports the following symptoms/concerns: The patient reports that she has been doing about the same since her last follow up appointment.  The patient noted that she continues to look for employment.  She noted that she has been trying to stay in a positive mindset and finds it helpful not to do well on her problems. The patient discussed family and financial stressors impacting her life currently.  She shared that she is aware that she cannot control other family members behavior only her own.  She explained that helps her deal with difficult relationships in her life.  Natalie Frey stated that overall she is continuing to do well. The patient report reports that she continues to take mirtazapine 22.5 mg at bedtime.  Natalie Frey denied any homicidal or suicidal thoughts. Duration of problem: Years;  Severity of problem: moderate  Patient and/or Family's Strengths/Protective Factors: Concrete supports in place (healthy food, safe environments, etc.), Sense of purpose, and Physical Health (exercise, healthy diet, medication compliance, etc.)  Goals Addressed: Patient will:  Reduce symptoms of: agitation, anxiety, depression, and stress   Increase knowledge and/or ability of: coping skills, healthy habits, self-management skills, and stress reduction   Demonstrate ability to: Increase healthy adjustment to current life circumstances  Progress towards Goals: Ongoing  Interventions: Interventions utilized:  CBT Cognitive Behavioral Therapy was utilized by the clinician during today's follow up session. Clinician met with patient to identify needs related to stressors and functioning, and assess and monitor for signs and symptoms of anxiety and depression, and assess safety. The clinician processed with the patient how they have been doing since the last follow-up session.Clinician measured that patients anxiety and depression on a numerical scale. The clinician encouraged the patient to utilize their coping skills to deal with their current life circumstances.  The session ended with scheduling. Standardized Assessments completed: GAD-7 and PHQ 9 GAD-7= 10 PHQ-9= 04   Assessment: Patient currently experiencing see above.   Patient may benefit from see above.  Plan: Follow up with behavioral health clinician on : October 23, 2022 at 10:00 AM Behavioral recommendations:  Referral(s): [place] (In Clinic)  I discussed the assessment and treatment plan with the patient and/or parent/guardian. They were provided an opportunity to ask questions and all were answered. They agreed with the plan and demonstrated an understanding of the instructions.   They were advised to call back or seek an in-person evaluation if the symptoms worsen or if the condition fails to  improve as anticipated.  Lesli Albee, LCSWA

## 2022-10-08 ENCOUNTER — Other Ambulatory Visit: Payer: Self-pay

## 2022-10-08 ENCOUNTER — Other Ambulatory Visit: Payer: Self-pay | Admitting: Gerontology

## 2022-10-08 DIAGNOSIS — F418 Other specified anxiety disorders: Secondary | ICD-10-CM

## 2022-10-09 ENCOUNTER — Other Ambulatory Visit: Payer: Self-pay

## 2022-10-09 ENCOUNTER — Ambulatory Visit: Payer: Self-pay

## 2022-10-09 MED ORDER — MIRTAZAPINE 15 MG PO TABS
22.5000 mg | ORAL_TABLET | Freq: Every evening | ORAL | 2 refills | Status: DC
  Filled 2022-10-09: qty 45, 30d supply, fill #0
  Filled 2022-11-07: qty 45, 30d supply, fill #1
  Filled 2022-12-10: qty 45, 30d supply, fill #2

## 2022-10-10 ENCOUNTER — Ambulatory Visit: Payer: Self-pay

## 2022-10-18 ENCOUNTER — Other Ambulatory Visit: Payer: Self-pay

## 2022-10-18 ENCOUNTER — Ambulatory Visit: Payer: Self-pay | Admitting: Adult Health

## 2022-10-18 ENCOUNTER — Encounter: Payer: Self-pay | Admitting: Gerontology

## 2022-10-18 ENCOUNTER — Ambulatory Visit: Payer: Self-pay | Admitting: Gerontology

## 2022-10-18 VITALS — BP 117/74 | HR 89 | Temp 98.8°F | Resp 17 | Ht 64.0 in | Wt 201.0 lb

## 2022-10-18 DIAGNOSIS — E1169 Type 2 diabetes mellitus with other specified complication: Secondary | ICD-10-CM

## 2022-10-18 DIAGNOSIS — Z7722 Contact with and (suspected) exposure to environmental tobacco smoke (acute) (chronic): Secondary | ICD-10-CM

## 2022-10-18 DIAGNOSIS — E559 Vitamin D deficiency, unspecified: Secondary | ICD-10-CM | POA: Diagnosis not present

## 2022-10-18 DIAGNOSIS — R7303 Prediabetes: Secondary | ICD-10-CM | POA: Diagnosis not present

## 2022-10-18 DIAGNOSIS — R269 Unspecified abnormalities of gait and mobility: Secondary | ICD-10-CM

## 2022-10-18 DIAGNOSIS — Z9181 History of falling: Secondary | ICD-10-CM

## 2022-10-18 LAB — POCT GLYCOSYLATED HEMOGLOBIN (HGB A1C): Hemoglobin A1C: 6.5 % — AB (ref 4.0–5.6)

## 2022-10-18 LAB — GLUCOSE, POCT (MANUAL RESULT ENTRY): POC Glucose: 125 mg/dL — AB (ref 70–99)

## 2022-10-18 MED ORDER — GLIPIZIDE 2.5 MG PO TABS
2.5000 mg | ORAL_TABLET | Freq: Every day | ORAL | 2 refills | Status: AC
  Filled 2022-10-18: qty 30, 30d supply, fill #0

## 2022-10-18 NOTE — Progress Notes (Signed)
Established Patient Office Visit  Subjective   Patient ID: Natalie Frey, female    DOB: 12-12-1955  Age: 67 y.o. MRN: [id number]  Chief Complaint  Patient presents with   Follow-up   Prediabetes    HPI Natalie Frey is a 67 y/o female with PMH of T2DM, DVT femoral vein, Hyperlipidemia, and stroke presents for follow up. Her blood glucose during today's visit is 125 mg/dL. Her HgbA1c checked during visit and is 6.5% which increased from last check which was 6.4% on 07/19/2022. She states that she is no longer taking metformin due to excessive diarrhea and she states that she lost her job due to the metformin side effects. She states that her last dose of metformin was about 2-3 months ago and all the side effects of metformin have subsided. She states that she has not been checking her blood glucose or blood pressure. She states that she is staying hydrated eating well, and sleeping well.  She states that she performs foot exams frequently when she showers. She denies burning or numbness. She consumes a lot of sweets to deter her from smoking cigarettes. She states that she stopped smoking cigarettes but still is exposed to second hand smoke from her significant other. She states that she has been experiencing gait  instability related to left leg weakness. She describes the weakness as starting from her knee to ankle. This was first noted beginning with her stroke and progressed after her hysterectomy that was preformed a year ago. She states that her last fall was approximately 1 month ago and denies syncope.  Her significant other reported that she does not engage in physical activity and pt responded that she is scared to fall. He also reported that she is having memory lapse. Overall she is doing well except for the above complaints.   Review of Systems  Constitutional: Negative.   HENT: Negative.    Eyes: Negative.   Respiratory: Negative.    Cardiovascular: Negative.   Gastrointestinal:  Negative.   Musculoskeletal:  Positive for falls.       She states that she experienced a fall 1 month ago.   Skin: Negative.   Neurological: Negative.   Psychiatric/Behavioral: Negative.        Objective:     There were no vitals taken for this visit. BP Readings from Last 3 Encounters:  10/18/22 117/74  07/19/22 (!) 146/90  04/12/22 133/83   Wt Readings from Last 3 Encounters:  10/18/22 201 lb (91.2 kg)  07/19/22 194 lb (88 kg)  04/12/22 183 lb 6.4 oz (83.2 kg)      Physical Exam Constitutional:      Appearance: Normal appearance.  HENT:     Head: Normocephalic.  Cardiovascular:     Rate and Rhythm: Normal rate and regular rhythm.     Pulses: Normal pulses.     Heart sounds: Normal heart sounds.  Pulmonary:     Effort: Pulmonary effort is normal.     Breath sounds: Normal breath sounds.  Musculoskeletal:     Comments: Shuffling gait present during ambulation.  Skin:    General: Skin is warm and dry.  Neurological:     Mental Status: She is alert and oriented to person, place, and time.     Gait: Gait abnormal.  Psychiatric:        Mood and Affect: Mood normal.        Behavior: Behavior normal.      No results found for  any visits on 10/18/22.  Last CBC Lab Results  Component Value Date   WBC 8.0 06/24/2019   HGB 15.7 06/24/2019   HCT 45.7 06/24/2019   MCV 93 06/24/2019   MCH 31.8 06/24/2019   RDW 12.4 06/24/2019   PLT 255 99/24/2683   Last metabolic panel Lab Results  Component Value Date   GLUCOSE 129 (H) 07/19/2022   NA 139 07/19/2022   K 3.6 07/19/2022   CL 102 07/19/2022   CO2 24 07/19/2022   BUN 7 (L) 07/19/2022   CREATININE 0.63 07/19/2022   EGFR 100 07/19/2022   CALCIUM 8.9 07/19/2022   PROT 7.0 06/02/2020   ALBUMIN 4.3 06/02/2020   LABGLOB 2.7 06/02/2020   AGRATIO 1.6 06/02/2020   BILITOT 0.8 06/02/2020   ALKPHOS 72 06/02/2020   AST 17 06/02/2020   ALT 18 06/02/2020   ANIONGAP 10 04/27/2019   Last lipids Lab Results   Component Value Date   CHOL 125 07/19/2022   HDL 42 07/19/2022   LDLCALC 62 07/19/2022   TRIG 114 07/19/2022   CHOLHDL 3.0 07/19/2022   Last hemoglobin A1c Lab Results  Component Value Date   HGBA1C 6.4 (A) 07/19/2022      The ASCVD Risk score (Arnett DK, et al., 2019) failed to calculate for the following reasons:   The patient has a prior MI or stroke diagnosis    Assessment & Plan:  1. Prediabetes -Pt will check her blood sugar 3 times a day for a week and ill return with a blood sugar log during next clinic appointment on 10/25/2022.  - Pt advised to follow a low carb/ low sugar diet.  -Pt started on glipizide 2.5 mg QD that should be taken with the largest meal of the day and to hold if blood sugar is less than 130 mg/dL prior to a meal.  -Pt  advised of signs and symptoms of hypoglycemia. - POCT Glucose (CBG) - POCT HgB A1C - Microalbumin / creatinine urine ratio; Future - HgB A1c - B12 and Folate Panel; Future - Comp Met (CMET); Future - Magnesium; Future - Phosphorus; Future - Lipid Profile; Future - HgB A1c; Future  2. Vitamin D deficiency - Reevaluating Vit D today.  - Vitamin D (25 hydroxy); Future - Vitamin B1 - B12 and Folate Panel - Comp Met (CMET) - Magnesium - Phosphorus - Lipid Profile - Vitamin D (25 hydroxy) - B12 and Folate Panel; Future - Comp Met (CMET); Future - Magnesium; Future - Phosphorus; Future - Lipid Profile; Future - HgB A1c; Future  3. Gait abnormality - Pt advise to be cautious when ambulating due to increased risk of falls.  -Referral placed to a neurologist, pt given application to apply for charity care.  - B12 and Folate Panel; Future - Comp Met (CMET); Future - Magnesium; Future - Phosphorus; Future - Lipid Profile; Future - HgB A1c; Future - Ambulatory referral to Neurology  4. Second hand smoke exposure - Pt exposed to second hand smoking due to her significant other using cigarettes.   5. Hyperlipidemia  associated with type 2 diabetes mellitus ([place]) -Pt will continue Lipitor, pt advised to take medication at night.   6. At risk for falling -Pt advised to be cautious with ambulating due to increase risk for falls.  - Ambulatory referral to Neurology  Problem List Items Addressed This Visit       Other   Prediabetes - Primary   Relevant Orders   POCT Glucose (CBG)   POCT  HgB A1C    F/U in a week in clinic, pt shall present with blood sugar log.   Sharon Mt, FNP student

## 2022-10-18 NOTE — Patient Instructions (Signed)

## 2022-10-19 ENCOUNTER — Other Ambulatory Visit: Payer: Self-pay

## 2022-10-19 LAB — LIPID PANEL
Chol/HDL Ratio: 3.2 ratio (ref 0.0–4.4)
Cholesterol, Total: 127 mg/dL (ref 100–199)
HDL: 40 mg/dL
LDL Chol Calc (NIH): 66 mg/dL (ref 0–99)
Triglycerides: 115 mg/dL (ref 0–149)
VLDL Cholesterol Cal: 21 mg/dL (ref 5–40)

## 2022-10-19 LAB — COMPREHENSIVE METABOLIC PANEL
ALT: 46 IU/L — ABNORMAL HIGH (ref 0–32)
AST: 42 IU/L — ABNORMAL HIGH (ref 0–40)
Albumin/Globulin Ratio: 0.9 — ABNORMAL LOW (ref 1.2–2.2)
Albumin: 3.6 g/dL — ABNORMAL LOW (ref 3.9–4.9)
Alkaline Phosphatase: 92 IU/L (ref 44–121)
BUN/Creatinine Ratio: 12 (ref 12–28)
BUN: 8 mg/dL (ref 8–27)
Bilirubin Total: 0.8 mg/dL (ref 0.0–1.2)
CO2: 20 mmol/L (ref 20–29)
Calcium: 8.9 mg/dL (ref 8.7–10.3)
Chloride: 103 mmol/L (ref 96–106)
Creatinine, Ser: 0.68 mg/dL (ref 0.57–1.00)
Globulin, Total: 4 g/dL (ref 1.5–4.5)
Glucose: 136 mg/dL — ABNORMAL HIGH (ref 70–99)
Potassium: 3.7 mmol/L (ref 3.5–5.2)
Sodium: 138 mmol/L (ref 134–144)
Total Protein: 7.6 g/dL (ref 6.0–8.5)
eGFR: 97 mL/min/{1.73_m2} (ref >59)

## 2022-10-19 LAB — VITAMIN B1

## 2022-10-19 LAB — MAGNESIUM: Magnesium: 2 mg/dL (ref 1.6–2.3)

## 2022-10-19 LAB — B12 AND FOLATE PANEL
Folate: 4.2 ng/mL (ref >3.0)
Vitamin B-12: 730 pg/mL (ref 232–1245)

## 2022-10-19 LAB — HEMOGLOBIN A1C
Est. average glucose Bld gHb Est-mCnc: 143 mg/dL
Hgb A1c MFr Bld: 6.6 % — ABNORMAL HIGH (ref 4.8–5.6)

## 2022-10-19 LAB — VITAMIN D 25 HYDROXY (VIT D DEFICIENCY, FRACTURES): Vit D, 25-Hydroxy: 6.1 ng/mL — ABNORMAL LOW (ref 30.0–100.0)

## 2022-10-19 LAB — PHOSPHORUS: Phosphorus: 4.5 mg/dL — ABNORMAL HIGH (ref 3.0–4.3)

## 2022-10-23 ENCOUNTER — Other Ambulatory Visit: Payer: Self-pay | Admitting: Gerontology

## 2022-10-23 ENCOUNTER — Encounter: Payer: Self-pay | Admitting: Neurology

## 2022-10-23 ENCOUNTER — Ambulatory Visit: Payer: Self-pay | Admitting: Licensed Clinical Social Worker

## 2022-10-23 ENCOUNTER — Other Ambulatory Visit: Payer: Self-pay

## 2022-10-23 DIAGNOSIS — E559 Vitamin D deficiency, unspecified: Secondary | ICD-10-CM

## 2022-10-23 MED ORDER — VITAMIN D (ERGOCALCIFEROL) 1.25 MG (50000 UNIT) PO CAPS
50000.0000 [IU] | ORAL_CAPSULE | ORAL | 0 refills | Status: AC
  Filled 2022-10-23: qty 12, 84d supply, fill #0

## 2022-10-25 ENCOUNTER — Ambulatory Visit: Payer: Self-pay | Admitting: Licensed Clinical Social Worker

## 2022-10-25 ENCOUNTER — Other Ambulatory Visit: Payer: Self-pay

## 2022-10-25 ENCOUNTER — Ambulatory Visit: Payer: Self-pay | Admitting: Gerontology

## 2022-10-25 DIAGNOSIS — R7303 Prediabetes: Secondary | ICD-10-CM

## 2022-10-25 NOTE — Patient Instructions (Signed)

## 2022-10-25 NOTE — Progress Notes (Unsigned)
Established Patient Office Visit  Subjective   Patient ID: Natalie Frey, female    DOB: 12/05/1955  Age: 67 y.o. MRN: [id number]  Chief Complaint  Patient presents with   Follow-up    Labs drawn 10/18/22   Diabetes   Prediabetes    Patient brought in blood sugar log for review today.    HPI  Natalie Frey is a 67 y/o female with PMH of T2DM, DVT femoral vein, Hyperlipidemia, and stroke presents for follow up.  She provided a blood glucose log. Her fasting blood glucose was as high as 114 mg/dL. Her postprandial blood glucose was as high as 137 mg/dL. She states that all of the blood glucose readings that she has taken before dinner have been less than 120 mg/dL, so she has not taken her Glipizide. She and her significant other endorse continued weakness and difficulty with daily activities. She has an upcoming appointment with neurologist on 11/15/2022. Overall, she is doing well and offers no further complaints at this time.    Review of Systems  Constitutional: Negative.   HENT: Negative.    Respiratory: Negative.    Cardiovascular: Negative.   Genitourinary: Negative.   Skin: Negative.   Psychiatric/Behavioral: Negative.        Objective:     BP 134/82 (BP Location: Right Arm, Patient Position: Sitting, Cuff Size: Large)   Pulse 90   Temp 98.4 F (36.9 C) (Oral)   Resp 17   Ht '5\' 4"'$  (1.626 m)   Wt 201 lb 3.2 oz (91.3 kg)   SpO2 97%   BMI 34.54 kg/m  BP Readings from Last 3 Encounters:  10/25/22 134/82  10/18/22 117/74  07/19/22 (!) 146/90   Wt Readings from Last 3 Encounters:  10/25/22 201 lb 3.2 oz (91.3 kg)  10/18/22 201 lb (91.2 kg)  07/19/22 194 lb (88 kg)      Physical Exam Constitutional:      Appearance: Normal appearance.  HENT:     Head: Normocephalic.  Cardiovascular:     Rate and Rhythm: Normal rate and regular rhythm.  Pulmonary:     Effort: Pulmonary effort is normal.     Breath sounds: Normal breath sounds.  Skin:    General: Skin is  warm and dry.  Neurological:     Mental Status: She is alert.  Psychiatric:        Mood and Affect: Mood normal.        Behavior: Behavior normal.        Thought Content: Thought content normal.        Judgment: Judgment normal.      No results found for any visits on 10/25/22.  Last CBC Lab Results  Component Value Date   WBC 8.0 06/24/2019   HGB 15.7 06/24/2019   HCT 45.7 06/24/2019   MCV 93 06/24/2019   MCH 31.8 06/24/2019   RDW 12.4 06/24/2019   PLT 255 52/77/8242   Last metabolic panel Lab Results  Component Value Date   GLUCOSE 136 (H) 10/18/2022   NA 138 10/18/2022   K 3.7 10/18/2022   CL 103 10/18/2022   CO2 20 10/18/2022   BUN 8 10/18/2022   CREATININE 0.68 10/18/2022   EGFR 97 10/18/2022   CALCIUM 8.9 10/18/2022   PHOS 4.5 (H) 10/18/2022   PROT 7.6 10/18/2022   ALBUMIN 3.6 (L) 10/18/2022   LABGLOB 4.0 10/18/2022   AGRATIO 0.9 (L) 10/18/2022   BILITOT 0.8 10/18/2022   ALKPHOS 92  10/18/2022   AST 42 (H) 10/18/2022   ALT 46 (H) 10/18/2022   ANIONGAP 10 04/27/2019   Last lipids Lab Results  Component Value Date   CHOL 127 10/18/2022   HDL 40 10/18/2022   LDLCALC 66 10/18/2022   TRIG 115 10/18/2022   CHOLHDL 3.2 10/18/2022   Last hemoglobin A1c Lab Results  Component Value Date   HGBA1C 6.6 (H) 10/18/2022   Last thyroid functions Lab Results  Component Value Date   TSH 1.840 04/17/2018      The ASCVD Risk score (Arnett DK, et al., 2019) failed to calculate for the following reasons:   The patient has a prior MI or stroke diagnosis    Assessment & Plan:  1. Prediabetes -Pt encouraged to monitor her fasting and postprandial blood glucose closely and to hold Glipizide if her blood glucose is <130 mg/dL. -Pt encouraged to follow a low carbohydrate/ non concentrated sweet diet.  - She was encouraged to exercise as tolerated. - Microalbumin / creatinine urine ratio  Problem List Items Addressed This Visit   None   F/U in about a month  or if symptoms worsen.   Sharon Mt, FNP student

## 2022-10-27 LAB — MICROALBUMIN / CREATININE URINE RATIO
Creatinine, Urine: 59.3 mg/dL
Microalb/Creat Ratio: 6 mg/g creat (ref 0–29)
Microalbumin, Urine: 3.8 ug/mL

## 2022-11-07 ENCOUNTER — Other Ambulatory Visit: Payer: Self-pay

## 2022-11-08 ENCOUNTER — Ambulatory Visit: Payer: Medicaid Other | Admitting: Licensed Clinical Social Worker

## 2022-11-10 NOTE — Progress Notes (Unsigned)
Initial neurology clinic note  Natalie Frey MRN: [id number] DOB: September 19, 1956  Referring provider: Erlene Quan*  Primary care provider: Langston Reusing, NP  Reason for consult:  Gait instability  Subjective:  This is [name], a 67 y.o. right-handed female with a medical history of right ACA ischemic stroke (~2020***), DM2, HLD, DVT, vit D deficiency, former smoker who presents to neurology clinic with gait instability and left leg weakness***. The patient is accompanied by ***.  ***  Gait instability due to left leg weakness - from knee to ankle First occurred after stroke, worsened after hysterectomy about 1 year ago She had a fall in 08/2022 She is not very active because she is afraid to fall On gabapentin 100 mg qhs, for paresthesia of left leg***  Patient previously saw neurology at [facility] (07/22/2019, Dr. Roanna Raider) after her stroke. Per clinic note from 07/22/2019: [name] is a 67 y.o. right handed female with a history of T2DM, tobacco use being evaluated in consultation at the [facility] for follow up of recent R ACA ischemic stroke.   The patient's symptoms started on 04/26/19. She was at work that day and was bending over to pick up things on the floor when she fell over.  Since then, she has been noticing the weakness and numbness in the left upper and lower extremity, more significantly in left lower extremity.  She went to outside hospital on 04/27/19 but eventually left after waiting for 3 hours without being seen.  Her symptoms failed to improve and worsened and so she presented to [facility] for evaluation.  CT head revealed right frontal lobe infarct in the ACA territory. CTA neck reveals complete occlusion of the right ICA at the level of the carotid bifurcation.   NIHSS on admission was 2. PT and OT were consulted. PT recommended 5x high but patient wanted to go home with home health. ASA 53m and  atorvastatin 80 mg were started. She was discharged home with a walker and bedside commode as well as home health and tobacco cessation resources and outpatient referral.    MEDICATIONS:  Outpatient Encounter Medications as of 11/15/2022  Medication Sig Note   acetaminophen (TYLENOL) 325 MG tablet Take 2 tablets (650 mg total) by mouth every 6 (six) hours as needed for mild pain (or Fever >/= 101). 07/13/2020: Patient takes 2-3 tablets a week as needed   aspirin EC 81 MG tablet Take 1 tablet (81 mg total) by mouth once daily.    atorvastatin (LIPITOR) 40 MG tablet TAKE TWO TABLETS BY MOUTH ONCE EVERY DAY AT 6:00PM    gabapentin (NEURONTIN) 100 MG capsule Take 1 capsule (100 mg total) by mouth once daily at bedtime. (Patient not taking: Reported on 10/18/2022)    glipiZIDE 2.5 MG TABS Take 2.5 mg by mouth daily with supper. 10/25/2022: Patient takes if blood sugar is >130   mirabegron ER (MYRBETRIQ) 50 MG TB24 tablet Take 1 tablet (50 mg total) by mouth once daily. 10/18/2022: Patient takes 2-3 times a week   mirtazapine (REMERON) 15 MG tablet Take 1.5 tablets (22.5 mg total) by mouth at bedtime.    Vitamin D, Ergocalciferol, (DRISDOL) 1.25 MG (50000 UNIT) CAPS capsule Take 1 capsule (50,000 Units total) by mouth every 7 (seven) days.    No facility-administered encounter medications on file as of 11/15/2022.    PAST MEDICAL HISTORY: Past Medical History:  Diagnosis Date   [place]10 [place], per patient)   Dvt femoral (deep venous thrombosis) ([place])    Hyperlipidemia    Paresthesia    Prediabetes    Stroke [facility])    July 2020, per patient   Type 2 diabetes mellitus without complications ([place]) 99991111    PAST SURGICAL HISTORY: Past Surgical History:  Procedure Laterality Date   ABDOMINAL HYSTERECTOMY  08/2021   BLADDER SURGERY     COLONOSCOPY WITH PROPOFOL N/A 02/11/2020   Procedure: COLONOSCOPY WITH PROPOFOL;  Surgeon: Jonathon Bellows, MD;  Location: [facility] ENDOSCOPY;   Service: Gastroenterology;  Laterality: N/A;   DILATION AND EVACUATION      ALLERGIES: Allergies  Allergen Reactions   Metformin And Related Diarrhea    Severe diarrhea    FAMILY HISTORY: Family History  Problem Relation Age of Onset   Diabetes Mother    Heart disease Mother    Stroke Mother    Mesothelioma Father    Hypertension Sister    Pulmonary embolism Sister        while in hospital; other sister- on BCPs.    Breast cancer Sister 104   Hypertension Sister    Hyperlipidemia Sister    Cancer Sister    Aneurysm Brother        brain   Stroke Brother    Other Brother        unknown medical history   Deep vein thrombosis Daughter         when pregnant     SOCIAL HISTORY: Social History   Tobacco Use   Smoking status: Former    Packs/day: 0.25    Years: 47.00    Total pack years: 11.75    Types: Cigarettes    Quit date: 01/2022    Years since quitting: 0.7   Smokeless tobacco: Never   Tobacco comments:    1 pack in 4 days; was able to quit for awhile. Patient stills has an occasional cigarettes, but  uses the Nicoderm patches prn Patient has now quit smoking  Vaping Use   Vaping Use: Never used  Substance Use Topics   Alcohol use: Not Currently    Comment: last use 11/2020   Drug use: Not Currently    Types: Marijuana, Cocaine    Comment: last use 11/14/20 (marijuana)   Social History   Social History Narrative   [place]; smoking; 1ppd/cutting down. Social. Worked for [employer] from [place].       Said she has been doing ok and does not need any assistance          - is currently on food stamps but those have been enough       NO consent form signed     Objective:  Vital Signs:  There were no vitals taken for this visit.  General:*** General appearance: Awake and alert. No distress. Cooperative with exam.  Skin: No obvious rash or jaundice. HEENT: Atraumatic. Anicteric. Lungs: Non-labored breathing on room air  Heart: Regular Abdomen:  Soft, non tender. Extremities: No edema. No obvious deformity.  Musculoskeletal: No obvious joint swelling. Psych: Affect appropriate.  Neurological: Mental Status: Alert. Speech fluent. No pseudobulbar affect Cranial Nerves: CNII: No RAPD. Visual fields intact. CNIII, IV, VI: PERRL. No nystagmus. EOMI. CN V: Facial sensation intact bilaterally to fine touch. [place] clench strong. Jaw jerk***. CN VII: Facial muscles symmetric and strong. No ptosis at rest or after sustained upgaze***. CN VIII: Hears finger rub well bilaterally. CN IX: No hypophonia. CN X:  Palate elevates symmetrically. CN XI: Full strength shoulder shrug bilaterally. CN XII: Tongue protrusion full and midline. No atrophy or fasciculations. No significant dysarthria*** Motor: Tone is ***. *** fasciculations in *** extremities. *** atrophy. No grip or percussive myotonia.  Individual muscle group testing (MRC grade out of 5):  Movement     Neck flexion ***    Neck extension ***     Right Left   Shoulder abduction *** ***   Shoulder adduction *** ***   Shoulder ext rotation *** ***   Shoulder int rotation *** ***   Elbow flexion *** ***   Elbow extension *** ***   Wrist extension *** ***   Wrist flexion *** ***   Finger abduction - FDI *** ***   Finger abduction - ADM *** ***   Finger extension *** ***   Finger distal flexion - 2/3 *** ***   Finger distal flexion - 4/5 *** ***   Thumb flexion - FPL *** ***   Thumb abduction - APB *** ***    Hip flexion *** ***   Hip extension *** ***   Hip adduction *** ***   Hip abduction *** ***   Knee extension *** ***   Knee flexion *** ***   Dorsiflexion *** ***   Plantarflexion *** ***   Inversion *** ***   Eversion *** ***   Great toe extension *** ***   Great toe flexion *** ***     Reflexes:  Right Left   Bicep *** ***   Tricep *** ***   BrRad *** ***   Knee *** ***   Ankle *** ***    Pathological Reflexes: Babinski: *** response  bilaterally*** Hoffman: *** Troemner: *** Pectoral: *** Palmomental: *** Facial: *** Midline tap: *** Sensation: Pinprick: *** Vibration: *** Temperature: *** Proprioception: *** Coordination: Intact finger-to- nose-finger bilaterally. Romberg negative.*** Gait: Able to rise from chair with arms crossed unassisted. Normal, narrow-based gait. Able to tandem walk. Able to walk on toes and heels.***   Labs and Imaging review: Internal labs: Lab Results  Component Value Date   HGBA1C 6.6 (H) 10/18/2022   Lab Results  Component Value Date   VITAMINB12 730 10/18/2022   Lab Results  Component Value Date   TSH 1.840 04/17/2018   No results found for: "ESRSEDRATE", "POCTSEDRATE"  Vit D (10/18/22): 6.1  Component     Latest Ref Rng 10/18/2022  Cholesterol, Total     100 - 199 mg/dL 127   Triglycerides     0 - 149 mg/dL 115   HDL Cholesterol     >39 mg/dL 40   VLDL Cholesterol Cal     5 - 40 mg/dL 21   LDL Chol Calc (NIH)     0 - 99 mg/dL 66   Total CHOL/HDL Ratio     0.0 - 4.4 ratio 3.2    Hypercoag work up for DVT - 04/09/2019: Component     Latest Ref Rng 04/09/2019  Beta-2 Glycoprotein I Ab, IgG     0 - 20 GPI IgG units <9   Beta-2-Glycoprotein I IgM     0 - 32 GPI IgM units <9   Beta-2-Glycoprotein I IgA     0 - 25 GPI IgA units <9   Anticardiolipin Ab,IgG,Qn     0 - 14 GPL U/mL 9   Anticardiolipin Ab,IgM,Qn     0 - 12 MPL U/mL <9   Anticardiolipin Ab,IgA,Qn     0 - 11 APL U/mL <  9   Fibrin derivatives D-dimer ([facility])     0.00 - 499.00 ng/mL (FEU) 716.54 (H)   Protein S, Total     60 - 150 % 98   Protein C, Total     60 - 150 % 119   Antithrombin Activity     75 - 120 % 114   Recommendations-F5LEID: Comment   Recommendations-PTGENE: Comment      External labs: Labs:  LDL 100.1  Hemoglobin A1c 6.6 (03/11/19)  CTA Neck (04/29/19): 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 0.5 cm right upper lobe groundglass nodule.  Attention on follow-up.   Imaging: CTH wo contrast (external, 04/29/2019): FINDINGS:  There is no midline shift. Right frontal hypodense region, involving anterior superior and middle frontal gyri, is concerning for acute infarct. No acute intracranial hemorrhage. No fractures are evident. The sinuses are pneumatized.   IMPRESSION:  Right frontal lobe acute infarct, ACA territory.   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 No evidence of dissection or aneurysm in the carotid or vertebral arteries.   The soft tissues are unremarkable without lymphadenopathy, mass or abscess. The visualized osseous structures are unremarkable. The airway is patent and midline. 0.5 cm groundglass nodule within the right upper lobe (6:149).   In this report, if stenosis of the internal carotid artery is reported, it is calculated based on the NASCET method: (1 - (minimal residual diameter/normal diameter of distal ICA)).   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   -0.5 cm right upper lobe groundglass nodule. Attention on follow-up.   Assessment/Plan:  Natalie Frey is a 67 y.o. female who presents for evaluation of ***. *** has a relevant medical history of ***. *** neurological examination is pertinent for ***. Available diagnostic data is significant for ***. This constellation of symptoms and objective data would most likely localize to ***. ***  PLAN: -Blood work: *** ***Vit D supplementation -Physical therapy referral -Continue aspirin 81 mg daily -Continue atorvastatin 40 mg daily  -Return to clinic ***  The impression above as well as the plan as outlined below were extensively discussed with the patient (in the company of ***) who voiced understanding. All questions were answered to their satisfaction.  The patient was counseled on pertinent  fall precautions per the printed material provided today, and as noted under the "Patient Instructions" section below.***  When available, results of the above investigations and possible further recommendations will be communicated to the patient via telephone/MyChart. Patient to call office if not contacted after expected testing turnaround time.   Total time spent reviewing records, interview, history/exam, documentation, and coordination of care on day of encounter:  *** min   Thank you for allowing me to participate in patient's care.  If I can answer any additional questions, I would be pleased to do so.  Kai Levins, MD   CC: Langston Reusing, NP [address] [place] [address]  CC: Referring provider: Erlene Quan, NP [address] [place],  [place] [address]

## 2022-11-15 ENCOUNTER — Ambulatory Visit (INDEPENDENT_AMBULATORY_CARE_PROVIDER_SITE_OTHER): Payer: Medicaid Other | Admitting: Neurology

## 2022-11-15 ENCOUNTER — Encounter: Payer: Self-pay | Admitting: Neurology

## 2022-11-15 VITALS — BP 126/78 | HR 87 | Ht 64.0 in | Wt 200.0 lb

## 2022-11-15 DIAGNOSIS — R202 Paresthesia of skin: Secondary | ICD-10-CM | POA: Diagnosis not present

## 2022-11-15 DIAGNOSIS — Z8673 Personal history of transient ischemic attack (TIA), and cerebral infarction without residual deficits: Secondary | ICD-10-CM | POA: Diagnosis not present

## 2022-11-15 DIAGNOSIS — R29898 Other symptoms and signs involving the musculoskeletal system: Secondary | ICD-10-CM | POA: Diagnosis not present

## 2022-11-15 DIAGNOSIS — R269 Unspecified abnormalities of gait and mobility: Secondary | ICD-10-CM

## 2022-11-15 DIAGNOSIS — R2689 Other abnormalities of gait and mobility: Secondary | ICD-10-CM | POA: Diagnosis not present

## 2022-11-15 NOTE — Patient Instructions (Signed)
I think your symptoms of left leg weakness and imbalance are related to your old stroke. I think your lack of physical activity has allowed these muscles to get weaker again.  I am referring you to physical therapy to help with this.  I would like to see you back in 6 months to see how you have progressed. If you have new or worsening symptoms, please call as you may need to be re-evaluated.  Continue aspirin 81 mg daily and atorvastatin 40 mg daily for stroke prevention.  Continue your vitamin D supplement.  The physicians and staff at [facility] are committed to providing excellent care. You may receive a survey requesting feedback about your experience at our office. We strive to receive "very good" responses to the survey questions. If you feel that your experience would prevent you from giving the office a "very good " response, please contact our office to try to remedy the situation. We may be reached at [phone]. Thank you for taking the time out of your busy day to complete the survey.  Kai Levins, MD [facility]

## 2022-11-19 ENCOUNTER — Other Ambulatory Visit: Payer: Self-pay

## 2022-11-26 ENCOUNTER — Ambulatory Visit: Payer: Medicaid Other | Admitting: Licensed Clinical Social Worker

## 2022-11-27 ENCOUNTER — Ambulatory Visit: Payer: Medicaid Other | Admitting: Licensed Clinical Social Worker

## 2022-11-27 ENCOUNTER — Ambulatory Visit: Payer: Medicaid Other | Admitting: Gerontology

## 2022-11-29 ENCOUNTER — Ambulatory Visit: Payer: Medicaid Other | Attending: Neurology

## 2022-11-29 DIAGNOSIS — R269 Unspecified abnormalities of gait and mobility: Secondary | ICD-10-CM | POA: Insufficient documentation

## 2022-11-29 DIAGNOSIS — R29898 Other symptoms and signs involving the musculoskeletal system: Secondary | ICD-10-CM | POA: Diagnosis not present

## 2022-11-29 DIAGNOSIS — R2689 Other abnormalities of gait and mobility: Secondary | ICD-10-CM | POA: Diagnosis not present

## 2022-11-29 NOTE — Therapy (Signed)
OUTPATIENT PHYSICAL THERAPY NEURO EVALUATION   Patient Name: Natalie Frey MRN: [id number] DOB:08-Sep-1956, 67 y.o., female Today's Date: 11/29/2022   PCP: None reported REFERRING PROVIDER: Dr. Kai Levins, MD  END OF SESSION:  PT End of Session - 11/29/22 1757     Visit Number 1    Number of Visits 17    Date for PT Re-Evaluation 01/24/23    Authorization Type 2x/week x 8 weeks    PT Start Time 1315    PT Stop Time 1400    PT Time Calculation (min) 45 min    Activity Tolerance Patient tolerated treatment well    Behavior During Therapy [facility] for tasks assessed/performed             Past Medical History:  Diagnosis Date   Cancer ([place])    bladder (10 years ago, per patient)   Dvt femoral (deep venous thrombosis) ([place])    Hyperlipidemia    Paresthesia    Prediabetes    Stroke [facility])    July 2020, per patient   Type 2 diabetes mellitus without complications ([place]) 99991111   Past Surgical History:  Procedure Laterality Date   ABDOMINAL HYSTERECTOMY  08/2021   BLADDER SURGERY     COLONOSCOPY WITH PROPOFOL N/A 02/11/2020   Procedure: COLONOSCOPY WITH PROPOFOL;  Surgeon: Jonathon Bellows, MD;  Location: [facility] ENDOSCOPY;  Service: Gastroenterology;  Laterality: N/A;   DILATION AND EVACUATION     Patient Active Problem List   Diagnosis Date Noted   Paresthesia of left leg 04/25/2021   Skin mole 04/25/2021   History of DVT of lower extremity 02/28/2021   Intermittent claudication ([place]) 02/23/2021   Ganglion cyst of wrist 02/23/2021   Prediabetes 12/08/2019   Health care maintenance 10/05/2019   Urinary leakage 09/02/2019   Elevated blood pressure reading 05/06/2019   Ischemic stroke diagnosed during current admission [facility]) 05/05/2019   Anxiety about health 05/05/2019   Paresthesia of left arm and leg 04/29/2019   Chronic deep vein thrombosis (DVT) of femoral vein of left lower extremity ([place]) 03/26/2019   Leg swelling 03/17/2019   Abnormal laboratory test 03/17/2019    Type 2 diabetes mellitus without complications ([place]) 99991111   Smoking 01/18/2018   DVT (deep venous thrombosis) ([place]) 12/23/2017    ONSET DATE: 2020/chronic  REFERRING DIAG: L leg weakness, imbalance, gait abnormality  THERAPY DIAG:  Left leg weakness  Imbalance  Gait abnormality  Rationale for Evaluation and Treatment: Rehabilitation  SUBJECTIVE:  SUBJECTIVE STATEMENT: Pt states she is having difficulty with her balance; especially while walking, during transfers from sit to stand, and also getting out of bed.  She has significantly limited her activity because of her balance.  She is fearful of falling.  She is having difficulty with navigating uneven surfaces (yard) to get out to her greenhouse area on her property.  She started using a SPC a few months (since November 2023) because of a near fall episode.  She had hysterectomy in Dec 2022- started noticing decrease in activity around then and since becoming more sedentary her confidence in her balance has declined.  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  Navigating to step over a "gap" (ie: getting onto/off a train to visit her siblings) is scary.  She emphasizes her fear and lack of confidence with her balance multiple times during history taking.  F/u with neurology in 6 months.    Pt accompanied by: significant other, his name is "Rich", he drives her to PT  PERTINENT HISTORY: Pt has h/o CVA (R ACA ischemic stroke) 2020;  hysterectomy Dec 2022   PAIN:  Are you having pain? No "uncomfortable legs"  PRECAUTIONS: Fall  WEIGHT BEARING RESTRICTIONS: No  FALLS: Has patient fallen in last 6 months? Yes. Number of falls 1 "near fall"  LIVING ENVIRONMENT: Lives with: lives with their family and lives  with an adult companion Lives in: House/apartment Stairs: yes,3 to enter home, b/l railings on 1 set and no railing on 1 set Has following equipment at home: Single point cane  PLOF: Independent  PATIENT GOALS: to improve her balance, improve strength, to improve confidence with balance and strength again.  She would like to be able to walk on uneven surfaces in yard/at the beach, and go to a concert in [place]:   COGNITION: Overall cognitive status: Within functional limits for tasks assessed   SENSATION: Not tested  COORDINATION: Deferred  EDEMA:  Deferred  POSTURE: rounded shoulders  LOWER EXTREMITY ROM:     Active  Right Eval Left Eval  Hip flexion    Hip extension    Hip abduction    Hip adduction    Hip internal rotation    Hip external rotation    Knee flexion    Knee extension    Ankle dorsiflexion    Ankle plantarflexion    Ankle inversion    Ankle eversion     (Blank rows = not tested)  LOWER EXTREMITY MMT:    MMT Right Eval Left Eval  Hip flexion 5 4  Hip extension    Hip abduction    Hip adduction    Hip internal rotation    Hip external rotation    Knee flexion 5 4  Knee extension 5 4  Ankle dorsiflexion 5 4  Ankle plantarflexion 5 4  Ankle inversion 5 4  Ankle eversion 5 4  (Blank rows = not tested)  BED MOBILITY:  Supine to sit SBA Rolling to Left SBA  TRANSFERS: Assistive device utilized: Single point cane  Sit to stand: Complete Independence Stand to sit: Complete Independence Chair to chair: Modified independence- uses hands for support Floor:  not assessed today- deferred   STAIRS: Deferred to visit #2  GAIT: Gait pattern: decreased step length L and R, decreased knee flexion L, small steps while turning 180 deg Distance walked: flat surfaces in clinic while holding cane and significant other's hand for "  confidence" Assistive device utilized: Single point cane [facility] is a little long for her height) Level of  assistance: Modified independence holds PT or significant other's hand for "confidence"   FUNCTIONAL TESTS:  OUTCOME MEASURES: TEST Outcome Interpretation  5 times sit<>stand 22 seconds >60 yo, >15 sec indicates increased risk for falls  10 meter walk test .71 m/s (14 sec) <1.0 m/s indicates increased risk for falls; limited community ambulator  [employer] /56 (deferred) <36/56 (100% risk for falls), 37-45 (80% risk for falls); 46-51 (>50% risk for falls); 52-55 (lower risk <25% of falls)  FOTO 49; 55 predicted visit 19 Predicted discharge score of %     TODAY'S TREATMENT:                                                                                                                              DATE: 11/29/22  PT education  PATIENT EDUCATION: Education details: PT POC/goals, why PT can help address her balance/strength and how this can improve confidence and reduce fear of falling Person educated: Patient and significant other Education method: Explanation Education comprehension: verbalized understanding  HOME EXERCISE PROGRAM: Will initiate at visit #2  GOALS: Goals reviewed with patient? Yes  SHORT TERM GOALS: Target date: 11/29/22  Pt will demonstrate ability to transfer supine to sidelying to sitting independently Baseline: CGA/SBA during initial evaluation today, pt lacks confidence too Goal status: INITIAL    LONG TERM GOALS: Target date: 01/24/23  Improve L LE strength 1/2 MMT to facilitate improved ability to ambulate on even and uneven surfaces x 15 minutes consecutively at home and in her yard Baseline: 4/5 Goal status: INITIAL  2.  Improve FOTO to >55 indicating an improved ability to perform her activities at [facility] Baseline: 49 Goal status: INITIAL  3.  Pt will be able to perform 5x STS in <15 seconds indicating reduced fall risk Baseline: 22 sec Goal status: INITIAL   ASSESSMENT:  CLINICAL IMPRESSION: Patient is a 67 y.o. F who was seen  today for physical therapy evaluation and treatment for impaired balance, abnormal gait, and LE weakness.  She is an appropriate candidate for skilled PT to address the impairments below and to reduce fall risk, reduce fear of falling, and improve safety with ambulation so she can be independent with her transfers/ADLs and navigate ambulating on even and uneven terrain again outside of her home allowing for increased participation in social and community activities again.  She relies on her significant other for driving right now.  She is motivated to participate in PT and should do well with tx.    OBJECTIVE IMPAIRMENTS: Abnormal gait, decreased activity tolerance, decreased balance, difficulty walking, and decreased strength.   ACTIVITY LIMITATIONS: lifting, standing, stairs, transfers, bed mobility, and locomotion level  PARTICIPATION LIMITATIONS: cleaning, interpersonal relationship, driving, community activity, occupation, and yard work  PERSONAL FACTORS: Past/current experiences, Time since onset of injury/illness/exacerbation, and 1-2 comorbidities: h/o CVA in 2020  are  also affecting patient's functional outcome.   REHAB POTENTIAL: Good  CLINICAL DECISION MAKING: Stable/uncomplicated  EVALUATION COMPLEXITY: Low  PLAN:  PT FREQUENCY: 2x/week  PT DURATION: 8 weeks  PLANNED INTERVENTIONS: Therapeutic exercises, Therapeutic activity, Neuromuscular re-education, Balance training, Gait training, Patient/Family education, Self Care, and Joint mobilization  PLAN FOR NEXT SESSION: adjust SPC height, begin balance/LE strengthening exercises, initiate HEP for LE strength/balance, work on building independence and confidence with transfers from L sidelying to sitting  Merdis Delay, PT, DPT, OCS  [id number]  Pincus Badder, PT 11/29/2022, 6:25 PM

## 2022-11-30 NOTE — Therapy (Signed)
OUTPATIENT PHYSICAL THERAPY NEURO TREATMENT  Patient Name: Natalie Frey MRN: [id number] DOB:Jan 31, 1956, 67 y.o., female Today's Date: 12/04/2022  END OF SESSION:  PT End of Session - 12/04/22 0859     Visit Number 2    Number of Visits 17    Date for PT Re-Evaluation 01/24/23    Authorization Type eval: 11/29/22    PT Start Time 0855    PT Stop Time 0930    PT Time Calculation (min) 35 min    Equipment Utilized During Treatment Gait belt    Activity Tolerance Patient tolerated treatment well    Behavior During Therapy [facility] for tasks assessed/performed            Past Medical History:  Diagnosis Date   Cancer ([place])    bladder (10 years ago, per patient)   Dvt femoral (deep venous thrombosis) ([place])    Hyperlipidemia    Paresthesia    Prediabetes    Stroke [facility])    July 2020, per patient   Type 2 diabetes mellitus without complications ([place]) 99991111   Past Surgical History:  Procedure Laterality Date   ABDOMINAL HYSTERECTOMY  08/2021   BLADDER SURGERY     COLONOSCOPY WITH PROPOFOL N/A 02/11/2020   Procedure: COLONOSCOPY WITH PROPOFOL;  Surgeon: Jonathon Bellows, MD;  Location: [facility] ENDOSCOPY;  Service: Gastroenterology;  Laterality: N/A;   DILATION AND EVACUATION     Patient Active Problem List   Diagnosis Date Noted   Paresthesia of left leg 04/25/2021   Skin mole 04/25/2021   History of DVT of lower extremity 02/28/2021   Intermittent claudication ([place]) 02/23/2021   Ganglion cyst of wrist 02/23/2021   Prediabetes 12/08/2019   Health care maintenance 10/05/2019   Urinary leakage 09/02/2019   Elevated blood pressure reading 05/06/2019   Ischemic stroke diagnosed during current admission ([place]) 05/05/2019   Anxiety about health 05/05/2019   Paresthesia of left arm and leg 04/29/2019   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 Kai Levins, MD  ONSET DATE: 2020/chronic  REFERRING DIAG: L leg weakness, imbalance, gait abnormality  THERAPY DIAG:  Left leg weakness  Imbalance  Rationale for Evaluation and Treatment: Rehabilitation   FROM INITIAL EVALUATION SUBJECTIVE:  SUBJECTIVE STATEMENT: Pt states she is having difficulty with her balance; especially while walking, during transfers from sit to stand, and also getting out of bed.  She has significantly limited her activity because of her balance.  She is fearful of falling.  She is having difficulty with navigating uneven surfaces (yard) to get out to her greenhouse area on her property.  She started using a SPC a few months (since November 2023) because of a near fall episode.  She had hysterectomy in Dec 2022- started noticing decrease in activity around then and since becoming more sedentary her confidence in her balance has declined.  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  Navigating to step over a "gap" (ie: getting onto/off a train to visit her siblings) is scary.  She emphasizes her fear and lack of confidence with her balance multiple times during history taking.  F/u with neurology in 6 months. Pt accompanied by: significant other, his name is "Rich", he drives her to PT  PERTINENT HISTORY: Pt has h/o CVA (R ACA ischemic stroke) 2020;  hysterectomy Dec 2022  PAIN: Are you having pain? No "uncomfortable legs"  PRECAUTIONS: Fall  WEIGHT BEARING RESTRICTIONS: No  FALLS: Has patient fallen in last 6 months? Yes. Number of falls 1 "near fall"  LIVING ENVIRONMENT: Lives  with: lives with their family and lives with an adult companion Lives in: House/apartment Stairs: yes,3 to enter home, b/l railings on 1 set and no railing on 1 set Has following equipment at home: Single point cane  PLOF: Independent  PATIENT GOALS: to improve her balance, improve strength, to improve confidence with balance and strength again.  She would like to be able to walk on uneven surfaces in yard/at the beach, and go to a concert in [place]:   COGNITION:Overall cognitive status: Within functional limits for tasks assessed   SENSATION: Not tested  COORDINATION: Deferred  EDEMA: Deferred  POSTURE: rounded shoulders  LOWER EXTREMITY ROM: Deferred  LOWER EXTREMITY MMT:    MMT Right Eval Left Eval  Hip flexion 5 4  Hip extension    Hip abduction    Hip adduction    Hip internal rotation    Hip external rotation    Knee flexion 5 4  Knee extension 5 4  Ankle dorsiflexion 5 4  Ankle plantarflexion 5 4  Ankle inversion 5 4  Ankle eversion 5 4  (Blank rows = not tested)  BED MOBILITY:  Supine to sit SBA Rolling to Left SBA  TRANSFERS: Assistive device utilized: Single point cane  Sit to stand: Complete Independence Stand to sit: Complete Independence Chair to chair: Modified independence- uses hands for support Floor:  not assessed today- deferred   STAIRS: Deferred to visit #2  GAIT: Gait pattern: decreased step length L and R, decreased knee flexion L, small steps while turning 180 deg Distance walked: flat surfaces in clinic while holding cane and significant other's hand for "confidence" Assistive device utilized: Single point cane (SPC is a little long for her height) Level of assistance: Modified independence holds PT or significant other's hand for "confidence"   FUNCTIONAL TESTS:  OUTCOME MEASURES: TEST Outcome Interpretation  5 times sit<>stand 22 seconds >60 yo, >15 sec indicates increased risk for falls  10 meter walk test .71  m/s (14 sec) <1.0 m/s indicates increased risk for falls; limited community ambulator  [employer] loss adjuster Deferred <36/56 (100% risk for falls), 37-45 (80%  risk for falls); 46-51 (>50% risk for falls); 52-55 (lower risk <25% of falls)  FOTO 49; 55 predicted visit 19 Predicted discharge score of %      TREATMENT   SUBJECTIVE: Pt arrives reporting "I hurt all over." She also complains of continued unsteadiness especially when walking on uneven surfaces. She is very anxious about falling.   PAIN: "I hurt all over"   Ther-ex  NuStep L1 x 5 minutes for warm-up during interval history (3 minutes unbilled);  Sit to stand from regular height chair without UE support x 10; Seated LAQ with 3# ankle weights x 10 BLE; Standing hip strengthening with 3# ankle weights: Hip flexion marches x 10 BLE; HS curls x 20 BLE;   Neuromuscular Re-education  BERG: 38/56; Feet together (FT) static balance x 30s; FT horizontal and vertical head turns x 30s each; Heel/toe raises without UE support x 10 each; HEP issued and reviewed with patient;   PATIENT EDUCATION: Education details: Pt educated throughout session about proper posture and technique with exercises. Improved exercise technique, movement at target joints, use of target muscles after min to mod verbal, visual, tactile cues. HEP Person educated: Patient and significant other Education method: Explanation, Demonstration, Verbal cues, and Handouts Education comprehension: verbalized understanding, returned demonstration, and verbal cues required   HOME EXERCISE PROGRAM: Access Code: [email or web address] URL: [email or web address] Date: 12/04/2022 Prepared by: Roxana Hires  Exercises - Romberg Stance  - 1 x daily - 7 x weekly - 3 reps - 30s hold - Romberg Stance with Head Rotation  - 1 x daily - 7 x weekly - 3 reps - 30s hold - Standing March with Counter Support  - 1 x daily - 7 x weekly - 2 sets - 10 reps - 3s  hold   GOALS: Goals reviewed with patient? Yes  SHORT TERM GOALS: Target date: 11/29/22  Pt will demonstrate ability to transfer supine to sidelying to sitting independently Baseline: CGA/SBA during initial evaluation today, pt lacks confidence too Goal status: INITIAL    LONG TERM GOALS: Target date: 01/24/23  Improve L LE strength 1/2 MMT to facilitate improved ability to ambulate on even and uneven surfaces x 15 minutes consecutively at home and in her yard Baseline: 4/5 Goal status: INITIAL  2.  Improve FOTO to >55 indicating an improved ability to perform her activities at [facility] Baseline: 49 Goal status: INITIAL  3.  Pt will be able to perform 5x STS in <15 seconds indicating reduced fall risk Baseline: 22 sec Goal status: INITIAL  4.  Pt will improve BERG by at least 3 points in order to demonstrate clinically significant improvement in balance.   Baseline: 38/56; Goal status: INITIAL   ASSESSMENT:  CLINICAL IMPRESSION: Pt arrived late for her appointment so time was abbreviated accordingly due to scheduling constraints. Performed BERG with patient today and she scored 38/56 indicating significant balance impairment. Initiated both balance and strength exercises during session and HEP provided. Pt encouraged to follow-up as scheduled. Plan to progress strengthening and balance exercises during future sessions. Pt will benefit from PT services to address deficits in strength, balance, and mobility in order to return to full function at home and decrease her risk for falls.      OBJECTIVE IMPAIRMENTS: Abnormal gait, decreased activity tolerance, decreased balance, difficulty walking, and decreased strength.   ACTIVITY LIMITATIONS: lifting, standing, stairs, transfers, bed mobility, and locomotion level  PARTICIPATION LIMITATIONS: cleaning, interpersonal relationship, driving, community activity, occupation, and yard work  PERSONAL FACTORS: Past/current  experiences, Time  since onset of injury/illness/exacerbation, and 1-2 comorbidities: h/o CVA in 2020  are also affecting patient's functional outcome.   REHAB POTENTIAL: Good  CLINICAL DECISION MAKING: Stable/uncomplicated  EVALUATION COMPLEXITY: Low  PLAN:  PT FREQUENCY: 2x/week  PT DURATION: 8 weeks  PLANNED INTERVENTIONS: Therapeutic exercises, Therapeutic activity, Neuromuscular re-education, Balance training, Gait training, Patient/Family education, Self Care, and Joint mobilization  PLAN FOR NEXT SESSION: Complete ABC with patient, adjust SPC height, progress balance/LE strengthening exercises, review/modify HEP as needed;  Lyndel [place] Haydon PT, DPT, GCS  Emery Dupuy, PT 12/04/2022, 11:19 AM

## 2022-12-04 ENCOUNTER — Ambulatory Visit: Payer: Medicaid Other | Attending: Neurology

## 2022-12-04 DIAGNOSIS — R2689 Other abnormalities of gait and mobility: Secondary | ICD-10-CM | POA: Insufficient documentation

## 2022-12-04 DIAGNOSIS — R269 Unspecified abnormalities of gait and mobility: Secondary | ICD-10-CM | POA: Diagnosis not present

## 2022-12-04 DIAGNOSIS — R29898 Other symptoms and signs involving the musculoskeletal system: Secondary | ICD-10-CM

## 2022-12-07 NOTE — Therapy (Signed)
OUTPATIENT PHYSICAL THERAPY NEURO TREATMENT  Patient Name: Natalie Frey MRN: [id number] DOB:Mar 19, 1956, 67 y.o., female Today's Date: 12/07/2022  END OF SESSION:   Past Medical History:  Diagnosis Date   Cancer ([place])    bladder (10 years ago, per patient)   Dvt femoral (deep venous thrombosis) ([place])    Hyperlipidemia    Paresthesia    Prediabetes    Stroke [facility])    July 2020, per patient   Type 2 diabetes mellitus without complications ([place]) 99991111   Past Surgical History:  Procedure Laterality Date   ABDOMINAL HYSTERECTOMY  08/2021   BLADDER SURGERY     COLONOSCOPY WITH PROPOFOL N/A 02/11/2020   Procedure: COLONOSCOPY WITH PROPOFOL;  Surgeon: Jonathon Bellows, MD;  Location: [facility] ENDOSCOPY;  Service: Gastroenterology;  Laterality: N/A;   DILATION AND EVACUATION     Patient Active Problem List   Diagnosis Date Noted   Paresthesia of left leg 04/25/2021   Skin mole 04/25/2021   History of DVT of lower extremity 02/28/2021   Intermittent claudication ([place]) 02/23/2021   Ganglion cyst of wrist 02/23/2021   Prediabetes 12/08/2019   Health care maintenance 10/05/2019   Urinary leakage 09/02/2019   Elevated blood pressure reading 05/06/2019   Ischemic stroke diagnosed during current admission [facility]) 05/05/2019   Anxiety about health 05/05/2019   Paresthesia of left arm and leg 04/29/2019   Chronic deep vein thrombosis (DVT) of femoral vein of left lower extremity ([place]) 03/26/2019   Leg swelling 03/17/2019   Abnormal laboratory test 03/17/2019   Type 2 diabetes mellitus without complications ([place]) 99991111   Smoking 01/18/2018   DVT (deep venous thrombosis) ([place]) 12/23/2017   PCP: None reported  REFERRING PROVIDER: Dr. Kai Levins, MD  ONSET DATE: 2020/chronic  REFERRING DIAG: L leg weakness, imbalance, gait abnormality  THERAPY DIAG:  Imbalance  Gait abnormality  Rationale for Evaluation and Treatment: Rehabilitation   FROM INITIAL EVALUATION SUBJECTIVE:                                                                                                                                                                                              SUBJECTIVE STATEMENT: Pt states she is having difficulty with her balance; especially while walking, during transfers from sit to stand, and also getting out of bed.  She has significantly limited her activity because of her balance.  She is fearful of falling.  She is having difficulty with navigating uneven surfaces (yard) to get out to her greenhouse area on her property.  She started using a SPC a few months (since November 2023) because of a near fall episode.  She had hysterectomy in Dec 2022- started noticing decrease in activity around then and since becoming more sedentary her confidence in her balance has declined.  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  Navigating to step over a "gap" (ie: getting onto/off a train to visit her siblings) is scary.  She emphasizes her fear and lack of confidence with her balance multiple times during history taking.  F/u with neurology in 6 months. Pt accompanied by: significant other, his name is "Natalie Frey", he drives her to PT  PERTINENT HISTORY: Pt has h/o CVA (R ACA ischemic stroke) 2020;  hysterectomy Dec 2022  PAIN: Are you having pain? No "uncomfortable legs"  PRECAUTIONS: Fall  WEIGHT BEARING RESTRICTIONS: No  FALLS: Has patient fallen in last 6 months? Yes. Number of falls 1 "near fall"  LIVING ENVIRONMENT: Lives with: lives with their family and lives with an adult companion Lives in: House/apartment Stairs: yes,3 to enter home, b/l railings on 1 set and no railing on 1 set Has following equipment at home: Single point cane  PLOF: Independent  PATIENT GOALS: to improve her balance, improve strength, to improve confidence with balance and strength again.  She would like to be able to walk on uneven surfaces in yard/at the  beach, and go to a concert in [place]:   COGNITION:Overall cognitive status: Within functional limits for tasks assessed   SENSATION: Not tested  COORDINATION: Deferred  EDEMA: Deferred  POSTURE: rounded shoulders  LOWER EXTREMITY ROM: Deferred  LOWER EXTREMITY MMT:    MMT Right Eval Left Eval  Hip flexion 5 4  Hip extension    Hip abduction    Hip adduction    Hip internal rotation    Hip external rotation    Knee flexion 5 4  Knee extension 5 4  Ankle dorsiflexion 5 4  Ankle plantarflexion 5 4  Ankle inversion 5 4  Ankle eversion 5 4  (Blank rows = not tested)  BED MOBILITY:  Supine to sit SBA Rolling to Left SBA  TRANSFERS: Assistive device utilized: Single point cane  Sit to stand: Complete Independence Stand to sit: Complete Independence Chair to chair: Modified independence- uses hands for support Floor:  not assessed today- deferred   STAIRS: Deferred to visit #2  GAIT: Gait pattern: decreased step length L and R, decreased knee flexion L, small steps while turning 180 deg Distance walked: flat surfaces in clinic while holding cane and significant other's hand for "confidence" Assistive device utilized: Single point cane (SPC is a little long for her height) Level of assistance: Modified independence holds PT or significant other's hand for "confidence"   FUNCTIONAL TESTS:  OUTCOME MEASURES: TEST Outcome Interpretation  5 times sit<>stand 22 seconds >60 yo, >15 sec indicates increased risk for falls  10 meter walk test .71 m/s (14 sec) <1.0 m/s indicates increased risk for falls; limited community ambulator  Chartered loss adjuster Deferred <36/56 (100% risk for falls), 37-45 (80% risk for falls); 46-51 (>50% risk for falls); 52-55 (lower risk <25% of falls)  FOTO 49; 55 predicted visit 19 Predicted discharge score of %      TREATMENT   SUBJECTIVE: Pt arrives reporting "I hurt all over." She also complains of continued unsteadiness  especially when walking on uneven surfaces. She is very anxious about falling.   PAIN: "I hurt all over"   Ther-ex  NuStep L1 x 5 minutes for warm-up during interval history (3  minutes unbilled);  Sit to stand from regular height chair without UE support x 10; Seated LAQ with 3# ankle weights x 10 BLE; Standing hip strengthening with 3# ankle weights: Hip flexion marches x 10 BLE; HS curls x 20 BLE;   Neuromuscular Re-education  BERG: 38/56; Feet together (FT) static balance x 30s; FT horizontal and vertical head turns x 30s each; Heel/toe raises without UE support x 10 each; HEP issued and reviewed with patient;   PATIENT EDUCATION: Education details: Pt educated throughout session about proper posture and technique with exercises. Improved exercise technique, movement at target joints, use of target muscles after min to mod verbal, visual, tactile cues. HEP Person educated: Patient and significant other Education method: Explanation, Demonstration, Verbal cues, and Handouts Education comprehension: verbalized understanding, returned demonstration, and verbal cues required   HOME EXERCISE PROGRAM: Access Code: '[email or web address]'$ [email or web address] URL: [email or web address] Date: 12/04/2022 Prepared by: Roxana Hires  Exercises - Romberg Stance  - 1 x daily - 7 x weekly - 3 reps - 30s hold - Romberg Stance with Head Rotation  - 1 x daily - 7 x weekly - 3 reps - 30s hold - Standing March with Counter Support  - 1 x daily - 7 x weekly - 2 sets - 10 reps - 3s hold   GOALS: Goals reviewed with patient? Yes  SHORT TERM GOALS: Target date: 11/29/22  Pt will demonstrate ability to transfer supine to sidelying to sitting independently Baseline: CGA/SBA during initial evaluation today, pt lacks confidence too Goal status: INITIAL    LONG TERM GOALS: Target date: 01/24/23  Improve L LE strength 1/2 MMT to facilitate improved ability to ambulate on even and uneven surfaces x 15 minutes  consecutively at home and in her yard Baseline: 4/5 Goal status: INITIAL  2.  Improve FOTO to >55 indicating an improved ability to perform her activities at [facility] Baseline: 49 Goal status: INITIAL  3.  Pt will be able to perform 5x STS in <15 seconds indicating reduced fall risk Baseline: 22 sec Goal status: INITIAL  4.  Pt will improve BERG by at least 3 points in order to demonstrate clinically significant improvement in balance.   Baseline: 38/56; Goal status: INITIAL   ASSESSMENT:  CLINICAL IMPRESSION: Pt arrived late for her appointment so time was abbreviated accordingly due to scheduling constraints. Performed BERG with patient today and she scored 38/56 indicating significant balance impairment. Initiated both balance and strength exercises during session and HEP provided. Pt encouraged to follow-up as scheduled. Plan to progress strengthening and balance exercises during future sessions. Pt will benefit from PT services to address deficits in strength, balance, and mobility in order to return to full function at home and decrease her risk for falls.      OBJECTIVE IMPAIRMENTS: Abnormal gait, decreased activity tolerance, decreased balance, difficulty walking, and decreased strength.   ACTIVITY LIMITATIONS: lifting, standing, stairs, transfers, bed mobility, and locomotion level  PARTICIPATION LIMITATIONS: cleaning, interpersonal relationship, driving, community activity, occupation, and yard work  PERSONAL FACTORS: Past/current experiences, Time since onset of injury/illness/exacerbation, and 1-2 comorbidities: h/o CVA in 2020  are also affecting patient's functional outcome.   REHAB POTENTIAL: Good  CLINICAL DECISION MAKING: Stable/uncomplicated  EVALUATION COMPLEXITY: Low  PLAN:  PT FREQUENCY: 2x/week  PT DURATION: 8 weeks  PLANNED INTERVENTIONS: Therapeutic exercises, Therapeutic activity, Neuromuscular re-education, Balance training, Gait training,  Patient/Family education, Self Care, and Joint mobilization  PLAN FOR NEXT SESSION: Complete ABC with patient, adjust SPC height, progress balance/LE strengthening exercises,  review/modify HEP as needed;  Lyndel Safe Hammond Obeirne PT, DPT, GCS  Karyssa Amaral, PT 12/07/2022, 8:40 AM

## 2022-12-10 ENCOUNTER — Other Ambulatory Visit: Payer: Self-pay

## 2022-12-11 ENCOUNTER — Ambulatory Visit: Payer: Medicaid Other

## 2022-12-11 ENCOUNTER — Other Ambulatory Visit: Payer: Self-pay

## 2022-12-11 DIAGNOSIS — R269 Unspecified abnormalities of gait and mobility: Secondary | ICD-10-CM | POA: Diagnosis not present

## 2022-12-11 DIAGNOSIS — R29898 Other symptoms and signs involving the musculoskeletal system: Secondary | ICD-10-CM | POA: Diagnosis not present

## 2022-12-11 DIAGNOSIS — R2689 Other abnormalities of gait and mobility: Secondary | ICD-10-CM | POA: Diagnosis not present

## 2022-12-13 ENCOUNTER — Ambulatory Visit: Payer: Medicaid Other

## 2022-12-13 DIAGNOSIS — R2689 Other abnormalities of gait and mobility: Secondary | ICD-10-CM

## 2022-12-13 DIAGNOSIS — R29898 Other symptoms and signs involving the musculoskeletal system: Secondary | ICD-10-CM | POA: Diagnosis not present

## 2022-12-13 DIAGNOSIS — R269 Unspecified abnormalities of gait and mobility: Secondary | ICD-10-CM

## 2022-12-13 NOTE — Therapy (Signed)
OUTPATIENT PHYSICAL THERAPY NEURO TREATMENT  Patient Name: Natalie Frey MRN: [id number] DOB:[birth year], 67 y.o., female Today's Date: 12/13/2022   END OF SESSION:  PT End of Session - 12/13/22 0853     Visit Number 4    Number of Visits 17    Date for PT Re-Evaluation 01/24/23    Authorization Type eval: 11/29/22    PT Start Time 0846    PT Stop Time 0930    PT Time Calculation (min) 44 min    Equipment Utilized During Treatment Gait belt    Activity Tolerance Patient tolerated treatment well    Behavior During Therapy [facility] for tasks assessed/performed            Past Medical History:  Diagnosis Date   Cancer ([place])    bladder (10 years ago, per patient)   Dvt femoral (deep venous thrombosis) ([place])    Hyperlipidemia    Paresthesia    Prediabetes    Stroke [facility])    July 2020, per patient   Type 2 diabetes mellitus without complications ([place]) 99991111   Past Surgical History:  Procedure Laterality Date   ABDOMINAL HYSTERECTOMY  08/2021   BLADDER SURGERY     COLONOSCOPY WITH PROPOFOL N/A 02/11/2020   Procedure: COLONOSCOPY WITH PROPOFOL;  Surgeon: Jonathon Bellows, MD;  Location: [facility] END[facility]COPY;  Service: Gastroenterology;  Laterality: N/A;   DILATION AND EVACUATION     Patient Active Problem List   Diagnosis Date Noted   Paresthesia of left leg 04/25/2021   Skin mole 04/25/2021   History of DVT of lower extremity 02/28/2021   Intermittent claudication ([place]) 02/23/2021   Ganglion cyst of wrist 02/23/2021   Prediabetes 12/08/2019   Health care maintenance 10/05/2019   Urinary leakage 09/02/2019   Elevated blood pressure reading 05/06/2019   Ischemic stroke diagnosed during current admission ([place]) 05/05/2019   Anxiety about health 05/05/2019   Paresthesia of left arm and leg 04/29/2019   Chronic deep vein thrombosis (DVT) of femoral vein of left lower extremity ([place]) 03/26/2019   Leg swelling 03/17/2019   Abnormal laboratory test 03/17/2019   Type 2 diabetes  mellitus without complications ([place]) 99991111   Smoking 01/18/2018   DVT (deep venous thrombosis) ([place]) 12/23/2017   PCP: None reported  REFERRING PROVIDER: Dr. Kai Levins, MD  ONSET DATE: 2020/chronic  REFERRING DIAG: L leg weakness, imbalance, gait abnormality  THERAPY DIAG:  Imbalance  Gait abnormality  Rationale for Evaluation and Treatment: Rehabilitation   FROM INITIAL EVALUATION SUBJECTIVE:  SUBJECTIVE STATEMENT: Pt states she is having difficulty with her balance; especially while walking, during transfers from sit to stand, and also getting out of bed.  She has significantly limited her activity because of her balance.  She is fearful of falling.  She is having difficulty with navigating uneven surfaces (yard) to get out to her greenhouse area on her property.  She started using a SPC a few months (since November 2023) because of a near fall episode.  She had hysterectomy in Dec 2022- started noticing decrease in activity around then and since becoming more sedentary her confidence in her balance has declined.  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  Navigating to step over a "gap" (ie: getting onto/off a train to visit her siblings) is scary.  She emphasizes her fear and lack of confidence with her balance multiple times during history taking.  F/u with neurology in 6 months. Pt accompanied by: significant other, his name is "Rich", he drives her to PT  PERTINENT HISTORY: Pt has h/o CVA (R ACA ischemic stroke) 2020;  hysterectomy Dec 2022  PAIN: Are you having pain? No "uncomfortable legs"  PRECAUTIONS: Fall  WEIGHT BEARING RESTRICTIONS: No  FALLS: Has patient fallen in last 6 months? Yes. Number of falls 1 "near fall"  LIVING  ENVIRONMENT: Lives with: lives with their family and lives with an adult companion Lives in: House/apartment Stairs: yes,3 to enter home, b/l railings on 1 set and no railing on 1 set Has following equipment at home: Single point cane  PLOF: Independent  PATIENT GOALS: to improve her balance, improve strength, to improve confidence with balance and strength again.  She would like to be able to walk on uneven surfaces in yard/at the beach, and go to a concert in [place]:   COGNITION:Overall cognitive status: Within functional limits for tasks assessed   SENSATION: Not tested  COORDINATION: Deferred  EDEMA: Deferred  POSTURE: rounded shoulders  LOWER EXTREMITY ROM: Deferred  LOWER EXTREMITY MMT:    MMT Right Eval Left Eval  Hip flexion 5 4  Hip extension    Hip abduction    Hip adduction    Hip internal rotation    Hip external rotation    Knee flexion 5 4  Knee extension 5 4  Ankle dorsiflexion 5 4  Ankle plantarflexion 5 4  Ankle inversion 5 4  Ankle eversion 5 4  (Blank rows = not tested)  BED MOBILITY:  Supine to sit SBA Rolling to Left SBA  TRANSFERS: Assistive device utilized: Single point cane  Sit to stand: Complete Independence Stand to sit: Complete Independence Chair to chair: Modified independence- uses hands for support Floor:  not assessed today- deferred   STAIRS: Deferred to visit #2  GAIT: Gait pattern: decreased step length L and R, decreased knee flexion L, small steps while turning 180 deg Distance walked: flat surfaces in clinic while holding cane and significant other's hand for "confidence" Assistive device utilized: Single point cane (SPC is a little long for her height) Level of assistance: Modified independence holds PT or significant other's hand for "confidence"   FUNCTIONAL TESTS:  OUTCOME MEASURES: TEST Outcome Interpretation  5 times sit<>stand 22 seconds >60 yo, >15 sec indicates increased risk for falls  10  meter walk test .71 m/s (14 sec) <1.0 m/s indicates increased risk for falls; limited community ambulator  Chartered loss adjuster Deferred <36/56 (100% risk for falls), 37-45 (80%  risk for falls); 46-51 (>50% risk for falls); 52-55 (lower risk <25% of falls)  FOTO 49; 55 predicted visit 19 Predicted discharge score of %       TREATMENT   SUBJECTIVE:  Pt reports that she is doing well today. No updates since the last therapy session. No specific questions or concerns today.    PAIN: Generalized chronic widespread pain but no focal pain today.    Ther-ex  NuStep L1-2 x 6 minutes for warm-up during interval history (2 minutes unbilled); 6" alternating forward step-ups x 10 BLE; Stair negotiation 4 steps x 4 laps Sit to stand from regular height chair without UE support 2 x 12; Seated LAQ with 4# ankle weights 2 x 10 BLE;  Standing hip strengthening with 4# ankle weights: Hip flexion marches x 10 BLE; HS curls x 10 BLE; Hip abduction x10 BLE  Standing heel raises with BUE support x 10; Mini squats x 10, cues for proper form/technique;   Neuromuscular Re-education  Feet together (FT) static balance x 30s; FT eye closed x 30s; FT horizontal and vertical head turns x 30s each; Airex FT static balance x 30s; Airex FT horizontal and vertical head turns x 30s each;   Not performed Semi tandem x30 sec each   PATIENT EDUCATION: Education details: Pt educated throughout session about proper posture and technique with exercises. Improved exercise technique, movement at target joints, use of target muscles after min to mod verbal, visual, tactile cues.  Person educated: Patient Education method: Explanation, Demonstration, and Verbal cues Education comprehension: verbalized understanding, returned demonstration, and verbal cues required   HOME EXERCISE PROGRAM: Access Code: '[email or web address]'$ [email or web address] URL: [email or web address] Date: 12/04/2022 Prepared by: Roxana Hires  Exercises - Romberg Stance  - 1 x daily - 7 x weekly - 3 reps - 30s hold - Romberg Stance with Head Rotation  - 1 x daily - 7 x weekly - 3 reps - 30s hold - Standing March with Counter Support  - 1 x daily - 7 x weekly - 2 sets - 10 reps - 3s hold   GOALS: Goals reviewed with patient? Yes  SHORT TERM GOALS: Target date: 11/29/22  Pt will demonstrate ability to transfer supine to sidelying to sitting independently Baseline: CGA/SBA during initial evaluation today, pt lacks confidence too Goal status: INITIAL    LONG TERM GOALS: Target date: 01/24/23  Improve L LE strength 1/2 MMT to facilitate improved ability to ambulate on even and uneven surfaces x 15 minutes consecutively at home and in her yard Baseline: 4/5 Goal status: INITIAL  2.  Improve FOTO to >55 indicating an improved ability to perform her activities at [facility] Baseline: 49 Goal status: INITIAL  3.  Pt will be able to perform 5x STS in <15 seconds indicating reduced fall risk Baseline: 22 sec Goal status: INITIAL  4.  Pt will improve BERG by at least 3 points in order to demonstrate clinically significant improvement in balance.   Baseline: 38/56; Goal status: INITIAL   ASSESSMENT:  CLINICAL IMPRESSION: Pt is agreeable to participate in therapy services. She displays continued fear of falling but no worse today compared to prior sessions. Pt able to perform all therapeutic interventions requiring intermittent rest breaks d/t fatigue. Practiced stairs as well as balance on unstable Airex pad. Pt will benefit from PT services to address deficits in strength, balance, and mobility in order to return to full function at home and decrease her risk for falls.      OBJECTIVE IMPAIRMENTS:  Abnormal gait, decreased activity tolerance, decreased balance, difficulty walking, and decreased strength.   ACTIVITY LIMITATIONS: lifting, standing, stairs, transfers, bed mobility, and locomotion level  PARTICIPATION  LIMITATIONS: cleaning, interpersonal relationship, driving, community activity, occupation, and yard work  PERSONAL FACTORS: Past/current experiences, Time since onset of injury/illness/exacerbation, and 1-2 comorbidities: h/o CVA in 2020  are also affecting patient's functional outcome.   REHAB POTENTIAL: Good  CLINICAL DECISION MAKING: Stable/uncomplicated  EVALUATION COMPLEXITY: Low  PLAN:  PT FREQUENCY: 2x/week  PT DURATION: 8 weeks  PLANNED INTERVENTIONS: Therapeutic exercises, Therapeutic activity, Neuromuscular re-education, Balance training, Gait training, Patient/Family education, Self Care, and Joint mobilization  PLAN FOR NEXT SESSION: Complete ABC with patient, progress balance/LE strengthening exercises, review/modify HEP as needed;  Lyndel Safe Vania Rosero PT, DPT, GCS  Johnathon Olden, PT 12/13/2022, 10:51 AM

## 2022-12-17 NOTE — Therapy (Signed)
OUTPATIENT PHYSICAL THERAPY NEURO TREATMENT  Patient Name: Natalie Frey MRN: [id number] DOB:[birth year], 67 y.o., female Today's Date: 12/18/2022   END OF SESSION:  PT End of Session - 12/18/22 0901     Visit Number 5    Number of Visits 17    Date for PT Re-Evaluation 01/24/23    Authorization Type eval: 11/29/22    PT Start Time 0852    PT Stop Time 0930    PT Time Calculation (min) 38 min    Equipment Utilized During Treatment Gait belt    Activity Tolerance Patient tolerated treatment well    Behavior During Therapy [facility] for tasks assessed/performed            Past Medical History:  Diagnosis Date   Cancer ([place])    bladder (10 years ago, per patient)   Dvt femoral (deep venous thrombosis) ([place])    Hyperlipidemia    Paresthesia    Prediabetes    Stroke [facility])    July 2020, per patient   Type 2 diabetes mellitus without complications ([place]) 99991111   Past Surgical History:  Procedure Laterality Date   ABDOMINAL HYSTERECTOMY  08/2021   BLADDER SURGERY     COLONOSCOPY WITH PROPOFOL N/A 02/11/2020   Procedure: COLONOSCOPY WITH PROPOFOL;  Surgeon: Jonathon Bellows, MD;  Location: [facility] ENDOSCOPY;  Service: Gastroenterology;  Laterality: N/A;   DILATION AND EVACUATION     Patient Active Problem List   Diagnosis Date Noted   Paresthesia of left leg 04/25/2021   Skin mole 04/25/2021   History of DVT of lower extremity 02/28/2021   Intermittent claudication ([place]) 02/23/2021   Ganglion cyst of wrist 02/23/2021   Prediabetes 12/08/2019   Health care maintenance 10/05/2019   Urinary leakage 09/02/2019   Elevated blood pressure reading 05/06/2019   Ischemic stroke diagnosed during current admission ([place]) 05/05/2019   Anxiety about health 05/05/2019   Paresthesia of left arm and leg 04/29/2019   Chronic deep vein thrombosis (DVT) of femoral vein of left lower extremity ([place]) 03/26/2019   Leg swelling 03/17/2019   Abnormal laboratory test 03/17/2019   Type 2 diabetes  mellitus without complications ([place]) 99991111   Smoking 01/18/2018   DVT (deep venous thrombosis) ([place]) 12/23/2017   PCP: None reported  REFERRING PROVIDER: Dr. Kai Levins, MD  ONSET DATE: 2020/chronic  REFERRING DIAG: L leg weakness, imbalance, gait abnormality  THERAPY DIAG:  Imbalance  Gait abnormality  Rationale for Evaluation and Treatment: Rehabilitation   FROM INITIAL EVALUATION SUBJECTIVE:  SUBJECTIVE STATEMENT: Pt states she is having difficulty with her balance; especially while walking, during transfers from sit to stand, and also getting out of bed.  She has significantly limited her activity because of her balance.  She is fearful of falling.  She is having difficulty with navigating uneven surfaces (yard) to get out to her greenhouse area on her property.  She started using a SPC a few months (since November 2023) because of a near fall episode.  She had hysterectomy in Dec 2022- started noticing decrease in activity around then and since becoming more sedentary her confidence in her balance has declined.  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  Navigating to step over a "gap" (ie: getting onto/off a train to visit her siblings) is scary.  She emphasizes her fear and lack of confidence with her balance multiple times during history taking.  F/u with neurology in 6 months. Pt accompanied by: significant other, his name is "Rich", he drives her to PT  PERTINENT HISTORY: Pt has h/o CVA (R ACA ischemic stroke) 2020;  hysterectomy Dec 2022  PAIN: Are you having pain? No "uncomfortable legs"  PRECAUTIONS: Fall  WEIGHT BEARING RESTRICTIONS: No  FALLS: Has patient fallen in last 6 months? Yes. Number of falls 1 "near fall"  LIVING  ENVIRONMENT: Lives with: lives with their family and lives with an adult companion Lives in: House/apartment Stairs: yes,3 to enter home, b/l railings on 1 set and no railing on 1 set Has following equipment at home: Single point cane  PLOF: Independent  PATIENT GOALS: to improve her balance, improve strength, to improve confidence with balance and strength again.  She would like to be able to walk on uneven surfaces in yard/at the beach, and go to a concert in [place]:   COGNITION:Overall cognitive status: Within functional limits for tasks assessed   SENSATION: Not tested  COORDINATION: Deferred  EDEMA: Deferred  POSTURE: rounded shoulders  LOWER EXTREMITY ROM: Deferred  LOWER EXTREMITY MMT:    MMT Right Eval Left Eval  Hip flexion 5 4  Hip extension    Hip abduction    Hip adduction    Hip internal rotation    Hip external rotation    Knee flexion 5 4  Knee extension 5 4  Ankle dorsiflexion 5 4  Ankle plantarflexion 5 4  Ankle inversion 5 4  Ankle eversion 5 4  (Blank rows = not tested)  BED MOBILITY:  Supine to sit SBA Rolling to Left SBA  TRANSFERS: Assistive device utilized: Single point cane  Sit to stand: Complete Independence Stand to sit: Complete Independence Chair to chair: Modified independence- uses hands for support Floor:  not assessed today- deferred   STAIRS: Deferred to visit #2  GAIT: Gait pattern: decreased step length L and R, decreased knee flexion L, small steps while turning 180 deg Distance walked: flat surfaces in clinic while holding cane and significant other's hand for "confidence" Assistive device utilized: Single point cane (SPC is a little long for her height) Level of assistance: Modified independence holds PT or significant other's hand for "confidence"   FUNCTIONAL TESTS:  OUTCOME MEASURES: TEST Outcome Interpretation  5 times sit<>stand 22 seconds >60 yo, >15 sec indicates increased risk for falls  10  meter walk test .71 m/s (14 sec) <1.0 m/s indicates increased risk for falls; limited community ambulator  Chartered loss adjuster Deferred <36/56 (100% risk for falls), 37-45 (80%  risk for falls); 46-51 (>50% risk for falls); 52-55 (lower risk <25% of falls)  FOTO 49; 55 predicted visit 19 Predicted discharge score of %       TREATMENT   SUBJECTIVE:  Pt reports that she is doing alright today. She has noticed some L knee swelling and pain recently. She thinks it may be related to her recent fall off the couch. She has also noticed some increased weakness in her LLE over the last few days. Denies any numbness/tingling or other neurologic symptoms. No specific questions today.    PAIN: L knee pain, generalized chronic widespread pain;   Ther-ex  NuStep L1-2 x 6 minutes for warm-up during interval history (2 minutes unbilled); Sit to stand from chair with Airex pad on seat without UE support 2 x 10; Seated LAQ with 4# ankle weights 2 x 10 BLE; Side stepping with 4# AW x multiple laps;  Standing hip strengthening with 4# ankle weights: Hip flexion marches x 10 BLE; HS curls x 10 BLE; Hip abduction x10 BLE Hip extension x 10 BLE;   Neuromuscular Re-education  Tandem balance alternating forward LE x 30s each; Tandem gait in // bars without UE support x 4 lengths; Obstacle course in // bars with 6" hurdles with faded UE support x 4 lengths;   Not performed Semi tandem x30 sec each 6" alternating forward step-ups x 10 BLE; Stair negotiation 4 steps x 4 laps Mini squats x 10, cues for proper form/technique; Standing heel raises with BUE support x 10; FT eye closed x 30s; FT horizontal and vertical head turns x 30s each; Airex FT static balance x 30s; Airex FT horizontal and vertical head turns x 30s each;   PATIENT EDUCATION: Education details: Pt educated throughout session about proper posture and technique with exercises. Improved exercise technique, movement at target  joints, use of target muscles after min to mod verbal, visual, tactile cues.  Person educated: Patient Education method: Explanation, Demonstration, and Verbal cues Education comprehension: verbalized understanding, returned demonstration, and verbal cues required   HOME EXERCISE PROGRAM: Access Code: [email or web address] URL: [email or web address] Date: 12/04/2022 Prepared by: Roxana Hires  Exercises - Romberg Stance  - 1 x daily - 7 x weekly - 3 reps - 30s hold - Romberg Stance with Head Rotation  - 1 x daily - 7 x weekly - 3 reps - 30s hold - Standing March with Counter Support  - 1 x daily - 7 x weekly - 2 sets - 10 reps - 3s hold   GOALS: Goals reviewed with patient? Yes  SHORT TERM GOALS: Target date: 11/29/22  Pt will demonstrate ability to transfer supine to sidelying to sitting independently Baseline: CGA/SBA during initial evaluation today, pt lacks confidence too Goal status: INITIAL    LONG TERM GOALS: Target date: 01/24/23  Improve L LE strength 1/2 MMT to facilitate improved ability to ambulate on even and uneven surfaces x 15 minutes consecutively at home and in her yard Baseline: 4/5 Goal status: INITIAL  2.  Improve FOTO to >55 indicating an improved ability to perform her activities at [facility] Baseline: 49 Goal status: INITIAL  3.  Pt will be able to perform 5x STS in <15 seconds indicating reduced fall risk Baseline: 22 sec Goal status: INITIAL  4.  Pt will improve BERG by at least 3 points in order to demonstrate clinically significant improvement in balance.   Baseline: 38/56; Goal status: INITIAL   ASSESSMENT:  CLINICAL IMPRESSION: Pt is agreeable to participate in  therapy services. She displays continued fear of falling but no worse today compared to prior sessions. Left knee does appear slightly swollen today. Calf is soft and compressible. Pt denies SOB or DOE. Pt able to perform all therapeutic interventions requiring intermittent rest breaks  d/t fatigue. No HEP updates at this time. Pt will benefit from PT services to address deficits in strength, balance, and mobility in order to return to full function at home and decrease her risk for falls.      OBJECTIVE IMPAIRMENTS: Abnormal gait, decreased activity tolerance, decreased balance, difficulty walking, and decreased strength.   ACTIVITY LIMITATIONS: lifting, standing, stairs, transfers, bed mobility, and locomotion level  PARTICIPATION LIMITATIONS: cleaning, interpersonal relationship, driving, community activity, occupation, and yard work  PERSONAL FACTORS: Past/current experiences, Time since onset of injury/illness/exacerbation, and 1-2 comorbidities: h/o CVA in 2020  are also affecting patient's functional outcome.   REHAB POTENTIAL: Good  CLINICAL DECISION MAKING: Stable/uncomplicated  EVALUATION COMPLEXITY: Low  PLAN:  PT FREQUENCY: 2x/week  PT DURATION: 8 weeks  PLANNED INTERVENTIONS: Therapeutic exercises, Therapeutic activity, Neuromuscular re-education, Balance training, Gait training, Patient/Family education, Self Care, and Joint mobilization  PLAN FOR NEXT SESSION: Complete ABC with patient, progress balance/LE strengthening exercises, review/modify HEP as needed;  Lyndel Safe Sherlon Nied PT, DPT, GCS  Kasidi Shanker, PT 12/18/2022, 12:39 PM

## 2022-12-18 ENCOUNTER — Ambulatory Visit: Payer: Medicaid Other

## 2022-12-18 DIAGNOSIS — R269 Unspecified abnormalities of gait and mobility: Secondary | ICD-10-CM

## 2022-12-18 DIAGNOSIS — R2689 Other abnormalities of gait and mobility: Secondary | ICD-10-CM

## 2022-12-18 DIAGNOSIS — R29898 Other symptoms and signs involving the musculoskeletal system: Secondary | ICD-10-CM | POA: Diagnosis not present

## 2022-12-18 NOTE — Progress Notes (Deleted)
NEUROLOGY FOLLOW UP OFFICE NOTE  Natalie Frey [id number]  Subjective:  Natalie Frey is a 67 y.o. year old right-handed female with a medical history of right ACA ischemic stroke (~2020), DM2, HLD, DVT, vit D deficiency, former smoker who we last saw on 11/15/22.  To briefly review: Patient had acute onset left sided weakness in 2020. She was eventually diagnosed with a right ACA ischemic stroke. She had virtual PT once due to COVID. She was doing okay until her hysterectomy (08/2021). After this surgery, she became much less physically active. She had a near fall in 08/2022 where she lost her balance. She started walking with a cane about 3-4 months ago.   She was having back pain and achiness about 1 year ago, so she was prescribed gabapentin. She was on gabapentin 100 mg qhs, which did not help, so she stopped it. She feels like her left lower leg is heavy. She denies tingling, burning. She has some low back pain with some radiation into left leg.   Patient previously saw neurology at [facility] (07/22/2019, Dr. Roanna Raider) after her stroke. Per clinic note from 07/22/2019: Natalie Frey is a 67 y.o. right handed female with a history of T2DM, tobacco use being evaluated in consultation at the [facility] for follow up of recent R ACA ischemic stroke.   The patient's symptoms started on 04/26/19. She was at work that day and was bending over to pick up things on the floor when she fell over.  Since then, she has been noticing the weakness and numbness in the left upper and lower extremity, more significantly in left lower extremity.  She went to outside hospital on 04/27/19 but eventually left after waiting for 3 hours without being seen.  Her symptoms failed to improve and worsened and so she presented to [facility] for evaluation.  CT head revealed right frontal lobe infarct in the ACA territory. 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   NIHSS on admission was 2. PT and OT were consulted. PT recommended 5x high but patient wanted to go home with home health. ASA 81mg  and atorvastatin 80 mg were started. She was discharged home with a walker and bedside commode as well as home health and tobacco cessation resources and outpatient referral.   Most recent Assessment and Plan (11/15/22): Her neurological examination is pertinent for subtle weakness of the left lower extremity and gait with difficulty clearing left toes when stepping through. Available diagnostic data is significant for CT head showing right frontal lobe infarct and CTA showing complete occlusion of the right ICA at level of carotid bifurcation by report (both studies from 2020, external). Patient's symptoms are most consistent with the residuals of weakness from her left frontal infarct in 2020 in the right ACA territory. A right lumbosacral radiculopathy is possible, but exam is not concerning for this currently. I think her symptoms have worsened over the last year due to deconditioning from physical inactivity since hysterectomy. I would like to start with physical therapy to help with left sided weakness, gait retraining, and help her get confidence in her walking again.   PLAN: -Continue Vit D supplementation -Physical therapy referral -Continue aspirin 81 mg daily -Continue atorvastatin 40 mg daily -Discussed gabapentin, but given that patient has minimal paresthesia that is not bothering her too badly currently, will hold off as this can contribute to imbalance.  Since  their last visit: ***  Patient is doing PT.***  MEDICATIONS:  Outpatient Encounter Medications as of 12/19/2022  Medication Sig Note   acetaminophen (TYLENOL) 325 MG tablet Take 2 tablets (650 mg total) by mouth every 6 (six) hours as needed for mild pain (or Fever >/= 101). 07/13/2020: Patient takes 2-3 tablets a week as needed   aspirin EC 81 MG tablet Take 1 tablet (81  mg total) by mouth once daily.    atorvastatin (LIPITOR) 40 MG tablet TAKE TWO TABLETS BY MOUTH ONCE EVERY DAY AT 6:00PM    glipiZIDE 2.5 MG TABS Take 2.5 mg by mouth daily with supper. 10/25/2022: Patient takes if blood sugar is >130   mirabegron ER (MYRBETRIQ) 50 MG TB24 tablet Take 1 tablet (50 mg total) by mouth once daily. 10/18/2022: Patient takes 2-3 times a week   mirtazapine (REMERON) 15 MG tablet Take 1.5 tablets (22.5 mg total) by mouth at bedtime.    Vitamin D, Ergocalciferol, (DRISDOL) 1.25 MG (50000 UNIT) CAPS capsule Take 1 capsule (50,000 Units total) by mouth every 7 (seven) days.    No facility-administered encounter medications on file as of 12/19/2022.    PAST MEDICAL HISTORY: Past Medical History:  Diagnosis Date   Cancer ([place])    bladder (10 years ago, per patient)   Dvt femoral (deep venous thrombosis) ([place])    Hyperlipidemia    Paresthesia    Prediabetes    Stroke [facility])    July 2020, per patient   Type 2 diabetes mellitus without complications ([place]) 99991111    PAST SURGICAL HISTORY: Past Surgical History:  Procedure Laterality Date   ABDOMINAL HYSTERECTOMY  08/2021   BLADDER SURGERY     COLONOSCOPY WITH PROPOFOL N/A 02/11/2020   Procedure: COLONOSCOPY WITH PROPOFOL;  Surgeon: Jonathon Bellows, MD;  Location: [facility] ENDOSCOPY;  Service: Gastroenterology;  Laterality: N/A;   DILATION AND EVACUATION      ALLERGIES: Allergies  Allergen Reactions   Metformin And Related Diarrhea    Severe diarrhea    FAMILY HISTORY: Family History  Problem Relation Age of Onset   Diabetes Mother    Heart disease Mother    Stroke Mother    Mesothelioma Father    Hypertension Sister    Pulmonary embolism Sister        while in hospital; other sister- on BCPs.    Breast cancer Sister 31   Hypertension Sister    Hyperlipidemia Sister    Cancer Sister    Aneurysm Brother        brain   Stroke Brother    Other Brother        unknown medical history   Deep vein  thrombosis Daughter         when pregnant     SOCIAL HISTORY: Social History   Tobacco Use   Smoking status: Former    Packs/day: 0.25    Years: 47.00    Additional pack years: 0.00    Total pack years: 11.75    Types: Cigarettes    Quit date: 01/2022    Years since quitting: 0.8   Smokeless tobacco: Never   Tobacco comments:    1 pack in 4 days; was able to quit for awhile. Patient stills has an occasional cigarettes, but  uses the Nicoderm patches prn Patient has now quit smoking  Vaping Use   Vaping Use: Never used  Substance Use Topics   Alcohol use: Not Currently    Comment: last use 11/2020  Drug use: Not Currently    Types: Marijuana, Cocaine    Comment: last use 11/14/20 (marijuana)   Social History   Social History Narrative   [place]; smoking; 1ppd/cutting down. Social. Worked for [employer] from [place].       Said she has been doing ok and does not need any assistance          - is currently on food stamps but those have been enough       .[facility] consent form signed    Are you right handed or left handed? RIGHT   Are you currently employed ?    What is your current occupation?RETIRED   Do you live at home alone?   Who lives with you? boyfriend   What type of home do you live in: 1 story or 2 story? one    Caffeine 1-2 cup in AM      Objective:  Vital Signs:  There were no vitals taken for this visit.  General:*** General appearance: Awake and alert. No distress. Cooperative with exam.  Skin: No obvious rash or jaundice. HEENT: Atraumatic. Anicteric. Lungs: Non-labored breathing on room air  Heart: Regular Abdomen: Soft, non tender. Extremities: No edema. No obvious deformity.  Musculoskeletal: No obvious joint swelling. Psych: Affect appropriate.  Neurological: Mental Status: Alert. Speech fluent. No pseudobulbar affect Cranial Nerves: CNII: No RAPD. Visual fields intact. CNIII, IV, VI: PERRL. No nystagmus. EOMI. CN V: Facial  sensation intact bilaterally to fine touch. [place] clench strong. Jaw jerk***. CN VII: Facial muscles symmetric and strong. No ptosis at rest or after sustained upgaze***. CN VIII: Hears finger rub well bilaterally. CN IX: No hypophonia. CN X: Palate elevates symmetrically. CN XI: Full strength shoulder shrug bilaterally. CN XII: Tongue protrusion full and midline. No atrophy or fasciculations. No significant dysarthria*** Motor: Tone is ***. *** fasciculations in *** extremities. *** atrophy. No grip or percussive myotonia.  Individual muscle group testing (MRC grade out of 5):  Movement     Neck flexion ***    Neck extension ***     Right Left   Shoulder abduction *** ***   Shoulder adduction *** ***   Shoulder ext rotation *** ***   Shoulder int rotation *** ***   Elbow flexion *** ***   Elbow extension *** ***   Wrist extension *** ***   Wrist flexion *** ***   Finger abduction - FDI *** ***   Finger abduction - ADM *** ***   Finger extension *** ***   Finger distal flexion - 2/3 *** ***   Finger distal flexion - 4/5 *** ***   Thumb flexion - FPL *** ***   Thumb abduction - APB *** ***    Hip flexion *** ***   Hip extension *** ***   Hip adduction *** ***   Hip abduction *** ***   Knee extension *** ***   Knee flexion *** ***   Dorsiflexion *** ***   Plantarflexion *** ***   Inversion *** ***   Eversion *** ***   Great toe extension *** ***   Great toe flexion *** ***     Reflexes:  Right Left   Bicep *** ***   Tricep *** ***   BrRad *** ***   Knee *** ***   Ankle *** ***    Pathological Reflexes: Babinski: *** response bilaterally*** Hoffman: *** Troemner: *** Pectoral: *** Palmomental: *** Facial: *** Midline tap: *** Sensation: Pinprick: *** Vibration: *** Temperature: ***  Proprioception: *** Coordination: Intact finger-to- nose-finger bilaterally. Romberg negative.*** Gait: Able to rise from chair with arms crossed unassisted. Normal,  narrow-based gait. Able to tandem walk. Able to walk on toes and heels.***  Labs and Imaging review: No new results***  Previously reviewed results: Lab Results  Component Value Date    HGBA1C 6.6 (H) 10/18/2022      Recent Labs       Lab Results  Component Value Date    VITAMINB12 730 10/18/2022      Recent Labs       Lab Results  Component Value Date    TSH 1.840 04/17/2018      Recent Labs[] Expand by Default  No results found for: "ESRSEDRATE", "POCTSEDRATE"     Vit D (10/18/22): 6.1   Component     Latest Ref Rng 10/18/2022  Cholesterol, Total     100 - 199 mg/dL 127   Triglycerides     0 - 149 mg/dL 115   HDL Cholesterol     >39 mg/dL 40   VLDL Cholesterol Cal     5 - 40 mg/dL 21   LDL Chol Calc (NIH)     0 - 99 mg/dL 66   Total CHOL/HDL Ratio     0.0 - 4.4 ratio 3.2     Hypercoag work up for DVT - 04/09/2019: Component     Latest Ref Rng 04/09/2019  Beta-2 Glycoprotein I Ab, IgG     0 - 20 GPI IgG units <9   Beta-2-Glycoprotein I IgM     0 - 32 GPI IgM units <9   Beta-2-Glycoprotein I IgA     0 - 25 GPI IgA units <9   Anticardiolipin Ab,IgG,Qn     0 - 14 GPL U/mL 9   Anticardiolipin Ab,IgM,Qn     0 - 12 MPL U/mL <9   Anticardiolipin Ab,IgA,Qn     0 - 11 APL U/mL <9   Fibrin derivatives D-dimer ([facility])     0.00 - 499.00 ng/mL (FEU) 716.54 (H)   Protein S, Total     60 - 150 % 98   Protein C, Total     60 - 150 % 119   Antithrombin Activity     75 - 120 % 114   Recommendations-F5LEID: Comment   Recommendations-PTGENE: Comment       External labs: Labs:  LDL 100.1  Hemoglobin A1c 6.6 (03/11/19)  CTA Neck (04/29/19): 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 0.5 cm right upper lobe groundglass nodule. Attention on follow-up.    Imaging: CTH wo contrast (external, 04/29/2019): FINDINGS:  There is no midline shift. Right frontal hypodense region, involving anterior superior and middle frontal gyri, is  concerning for acute infarct. No acute intracranial hemorrhage. No fractures are evident. The sinuses are pneumatized.   IMPRESSION:  Right frontal lobe acute infarct, ACA territory.    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 No evidence of dissection or aneurysm in the carotid or vertebral arteries.   The soft tissues are unremarkable without lymphadenopathy, mass or abscess. The visualized osseous structures are unremarkable. The airway is patent and midline. 0.5 cm groundglass nodule within the right upper lobe (6:149).   In this report, if stenosis of the internal carotid artery is reported, it is calculated based on the NASCET method: (1 - (minimal residual diameter/normal  diameter of distal ICA)).   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   -0.5 cm right upper lobe groundglass nodule. Attention on follow-up.   Assessment/Plan:  [name], a 67 y.o. female with: ***   Plan: ***  Return to clinic in ***  Total time spent reviewing records, interview, history/exam, documentation, and coordination of care on day of encounter:  *** min  Kai Levins, MD

## 2022-12-19 ENCOUNTER — Ambulatory Visit: Payer: Medicaid Other | Admitting: Neurology

## 2022-12-20 ENCOUNTER — Ambulatory Visit: Payer: Medicaid Other

## 2022-12-20 DIAGNOSIS — R269 Unspecified abnormalities of gait and mobility: Secondary | ICD-10-CM

## 2022-12-20 DIAGNOSIS — R2689 Other abnormalities of gait and mobility: Secondary | ICD-10-CM | POA: Diagnosis not present

## 2022-12-20 DIAGNOSIS — R29898 Other symptoms and signs involving the musculoskeletal system: Secondary | ICD-10-CM | POA: Diagnosis not present

## 2022-12-20 NOTE — Therapy (Signed)
OUTPATIENT PHYSICAL THERAPY NEURO TREATMENT  Patient Name: Natalie Frey MRN: [id number] DOB:06/15/1956, 67 y.o., female Today's Date: 12/20/2022  END OF SESSION:  PT End of Session - 12/20/22 0850     Visit Number 6    Number of Visits 17    Date for PT Re-Evaluation 01/24/23    Authorization Type eval: 11/29/22    PT Start Time 0843    PT Stop Time 0930    PT Time Calculation (min) 47 min    Equipment Utilized During Treatment Gait belt    Activity Tolerance Patient tolerated treatment well    Behavior During Therapy [facility] for tasks assessed/performed            Past Medical History:  Diagnosis Date   Cancer ([place])    bladder (10 years ago, per patient)   Dvt femoral (deep venous thrombosis) ([place])    Hyperlipidemia    Paresthesia    Prediabetes    Stroke [facility])    July 2020, per patient   Type 2 diabetes mellitus without complications ([place]) 99991111   Past Surgical History:  Procedure Laterality Date   ABDOMINAL HYSTERECTOMY  08/2021   BLADDER SURGERY     COLONOSCOPY WITH PROPOFOL N/A 02/11/2020   Procedure: COLONOSCOPY WITH PROPOFOL;  Surgeon: Jonathon Bellows, MD;  Location: [facility] ENDOSCOPY;  Service: Gastroenterology;  Laterality: N/A;   DILATION AND EVACUATION     Patient Active Problem List   Diagnosis Date Noted   Paresthesia of left leg 04/25/2021   Skin mole 04/25/2021   History of DVT of lower extremity 02/28/2021   Intermittent claudication ([place]) 02/23/2021   Ganglion cyst of wrist 02/23/2021   Prediabetes 12/08/2019   Health care maintenance 10/05/2019   Urinary leakage 09/02/2019   Elevated blood pressure reading 05/06/2019   Ischemic stroke diagnosed during current admission ([place]) 05/05/2019   Anxiety about health 05/05/2019   Paresthesia of left arm and leg 04/29/2019   Chronic deep vein thrombosis (DVT) of femoral vein of left lower extremity ([place]) 03/26/2019   Leg swelling 03/17/2019   Abnormal laboratory test 03/17/2019   Type 2 diabetes mellitus  without complications ([place]) 99991111   Smoking 01/18/2018   DVT (deep venous thrombosis) ([place]) 12/23/2017   PCP: None reported  REFERRING PROVIDER: Dr. Kai Levins, MD  ONSET DATE: 2020/chronic  REFERRING DIAG: L leg weakness, imbalance, gait abnormality  THERAPY DIAG:  Imbalance  Gait abnormality  Rationale for Evaluation and Treatment: Rehabilitation  FROM INITIAL EVALUATION SUBJECTIVE:  SUBJECTIVE STATEMENT: Pt states she is having difficulty with her balance; especially while walking, during transfers from sit to stand, and also getting out of bed.  She has significantly limited her activity because of her balance.  She is fearful of falling.  She is having difficulty with navigating uneven surfaces (yard) to get out to her greenhouse area on her property.  She started using a SPC a few months (since November 2023) because of a near fall episode.  She had hysterectomy in Dec 2022- started noticing decrease in activity around then and since becoming more sedentary her confidence in her balance has declined.  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  Navigating to step over a "gap" (ie: getting onto/off a train to visit her siblings) is scary.  She emphasizes her fear and lack of confidence with her balance multiple times during history taking.  F/u with neurology in 6 months. Pt accompanied by: significant other, his name is "Natalie Frey", he drives her to PT  PERTINENT HISTORY: Pt has h/o CVA (R ACA ischemic stroke) 2020;  hysterectomy Dec 2022  PAIN: Are you having pain? No "uncomfortable legs"  PRECAUTIONS: Fall  WEIGHT BEARING RESTRICTIONS: No  FALLS: Has patient fallen in last 6 months? Yes. Number of falls 1 "near fall"  LIVING ENVIRONMENT: Lives with:  lives with their family and lives with an adult companion Lives in: House/apartment Stairs: yes,3 to enter home, b/l railings on 1 set and no railing on 1 set Has following equipment at home: Single point cane  PLOF: Independent  PATIENT GOALS: to improve her balance, improve strength, to improve confidence with balance and strength again.  She would like to be able to walk on uneven surfaces in yard/at the beach, and go to a concert in [place]:   COGNITION:Overall cognitive status: Within functional limits for tasks assessed   SENSATION: Not tested  COORDINATION: Deferred  EDEMA: Deferred  POSTURE: rounded shoulders  LOWER EXTREMITY ROM: Deferred  LOWER EXTREMITY MMT:    MMT Right Eval Left Eval  Hip flexion 5 4  Hip extension    Hip abduction    Hip adduction    Hip internal rotation    Hip external rotation    Knee flexion 5 4  Knee extension 5 4  Ankle dorsiflexion 5 4  Ankle plantarflexion 5 4  Ankle inversion 5 4  Ankle eversion 5 4  (Blank rows = not tested)  BED MOBILITY:  Supine to sit SBA Rolling to Left SBA  TRANSFERS: Assistive device utilized: Single point cane  Sit to stand: Complete Independence Stand to sit: Complete Independence Chair to chair: Modified independence- uses hands for support Floor:  not assessed today- deferred   STAIRS: Deferred to visit #2  GAIT: Gait pattern: decreased step length L and R, decreased knee flexion L, small steps while turning 180 deg Distance walked: flat surfaces in clinic while holding cane and significant other's hand for "confidence" Assistive device utilized: Single point cane (SPC is a little long for her height) Level of assistance: Modified independence holds PT or significant other's hand for "confidence"   FUNCTIONAL TESTS:  OUTCOME MEASURES: TEST Outcome Interpretation  5 times sit<>stand 22 seconds >60 yo, >15 sec indicates increased risk for falls  10 meter walk test .71 m/s  (14 sec) <1.0 m/s indicates increased risk for falls; limited community ambulator  Chartered loss adjuster Deferred <36/56 (100% risk for falls), 37-45 (80%  risk for falls); 46-51 (>50% risk for falls); 52-55 (lower risk <25% of falls)  FOTO 49; 55 predicted visit 19 Predicted discharge score of %       TREATMENT   SUBJECTIVE:  Pt reports that she is doing alright today. No additional L knee swelling. She was unable to see the neurologist yesterday. No specific questions today.    PAIN: Generalized chronic widespread pain;   Ther-ex  Sit to stand from regular height chair with hands on knees x 10, x 7; Seated LAQ with 4# ankle weights 2 x 15 BLE;  Standing hip strengthening with 4# ankle weights: Hip flexion marches x 15 BLE; HS curls x 15 BLE; Hip abduction x 15 BLE Hip extension x 15 BLE;  NuStep L1-2 x 6 minutes for warm-up during interval history (2 minutes unbilled);   Neuromuscular Re-education  Alternating 6" toe taps x 10 BLE; 6" hurdle obstacle course x multiple lengths; Feet together horizontal and vertical head turns x 30s each;   Not performed Semi tandem x30 sec each 6" alternating forward step-ups x 10 BLE; Stair negotiation 4 steps x 4 laps Mini squats x 10, cues for proper form/technique; Standing heel raises with BUE support x 10; FT eye closed x 30s; FT horizontal and vertical head turns x 30s each; Airex FT static balance x 30s; Airex FT horizontal and vertical head turns x 30s each; Tandem balance alternating forward LE x 30s each; Tandem gait in // bars without UE support x 4 lengths; Obstacle course in // bars with 6" hurdles with faded UE support x 4 lengths;   PATIENT EDUCATION: Education details: Pt educated throughout session about proper posture and technique with exercises. Improved exercise technique, movement at target joints, use of target muscles after min to mod verbal, visual, tactile cues. Balance exercises. Person educated:  Patient Education method: Explanation, Demonstration, and Verbal cues Education comprehension: verbalized understanding, returned demonstration, and verbal cues required   HOME EXERCISE PROGRAM: Access Code: [email or web address] URL: [email or web address] Date: 12/04/2022 Prepared by: Roxana Hires  Exercises - Romberg Stance  - 1 x daily - 7 x weekly - 3 reps - 30s hold - Romberg Stance with Head Rotation  - 1 x daily - 7 x weekly - 3 reps - 30s hold - Standing March with Counter Support  - 1 x daily - 7 x weekly - 2 sets - 10 reps - 3s hold   GOALS: Goals reviewed with patient? Yes  SHORT TERM GOALS: Target date: 11/29/22  Pt will demonstrate ability to transfer supine to sidelying to sitting independently Baseline: CGA/SBA during initial evaluation today, pt lacks confidence too Goal status: INITIAL    LONG TERM GOALS: Target date: 01/24/23  Improve L LE strength 1/2 MMT to facilitate improved ability to ambulate on even and uneven surfaces x 15 minutes consecutively at home and in her yard Baseline: 4/5 Goal status: INITIAL  2.  Improve FOTO to >55 indicating an improved ability to perform her activities at [facility] Baseline: 49 Goal status: INITIAL  3.  Pt will be able to perform 5x STS in <15 seconds indicating reduced fall risk Baseline: 22 sec Goal status: INITIAL  4.  Pt will improve BERG by at least 3 points in order to demonstrate clinically significant improvement in balance.   Baseline: 38/56; Goal status: INITIAL   ASSESSMENT:  CLINICAL IMPRESSION: Pt is agreeable to participate in therapy services. She displays continued fear of falling but no worse today compared to prior sessions. Pt  able to perform all therapeutic interventions requiring intermittent rest breaks d/t fatigue. No HEP updates at this time. Pt will benefit from PT services to address deficits in strength, balance, and mobility in order to return to full function at home and decrease her  risk for falls.      OBJECTIVE IMPAIRMENTS: Abnormal gait, decreased activity tolerance, decreased balance, difficulty walking, and decreased strength.   ACTIVITY LIMITATIONS: lifting, standing, stairs, transfers, bed mobility, and locomotion level  PARTICIPATION LIMITATIONS: cleaning, interpersonal relationship, driving, community activity, occupation, and yard work  PERSONAL FACTORS: Past/current experiences, Time since onset of injury/illness/exacerbation, and 1-2 comorbidities: h/o CVA in 2020  are also affecting patient's functional outcome.   REHAB POTENTIAL: Good  CLINICAL DECISION MAKING: Stable/uncomplicated  EVALUATION COMPLEXITY: Low  PLAN:  PT FREQUENCY: 2x/week  PT DURATION: 8 weeks  PLANNED INTERVENTIONS: Therapeutic exercises, Therapeutic activity, Neuromuscular re-education, Balance training, Gait training, Patient/Family education, Self Care, and Joint mobilization  PLAN FOR NEXT SESSION: Complete ABC with patient, progress balance/LE strengthening exercises, review/modify HEP as needed;  Lyndel Safe Armoni Kludt PT, DPT, GCS  Channah Godeaux, PT 12/20/2022, 10:49 AM

## 2022-12-24 NOTE — Therapy (Signed)
OUTPATIENT PHYSICAL THERAPY NEURO TREATMENT  Patient Name: Natalie Frey MRN: [id number] DOB:[birth year], 67 y.o., female Today's Date: 12/25/2022  END OF SESSION:  PT End of Session - 12/25/22 1333     Visit Number 7    Number of Visits 17    Date for PT Re-Evaluation 01/24/23    Authorization Type eval: 11/29/22    PT Start Time 0845    PT Stop Time 0930    PT Time Calculation (min) 45 min    Equipment Utilized During Treatment Gait belt    Activity Tolerance Patient tolerated treatment well    Behavior During Therapy [facility] for tasks assessed/performed            Past Medical History:  Diagnosis Date   Cancer ([place])    bladder (10 years ago, per patient)   Dvt femoral (deep venous thrombosis) ([place])    Hyperlipidemia    Paresthesia    Prediabetes    Stroke [facility])    July 2020, per patient   Type 2 diabetes mellitus without complications ([place]) 99991111   Past Surgical History:  Procedure Laterality Date   ABDOMINAL HYSTERECTOMY  08/2021   BLADDER SURGERY     COLONOSCOPY WITH PROPOFOL N/A 02/11/2020   Procedure: COLONOSCOPY WITH PROPOFOL;  Surgeon: Jonathon Bellows, MD;  Location: [facility] ENDOSCOPY;  Service: Gastroenterology;  Laterality: N/A;   DILATION AND EVACUATION     Patient Active Problem List   Diagnosis Date Noted   Paresthesia of left leg 04/25/2021   Skin mole 04/25/2021   History of DVT of lower extremity 02/28/2021   Intermittent claudication ([place]) 02/23/2021   Ganglion cyst of wrist 02/23/2021   Prediabetes 12/08/2019   Health care maintenance 10/05/2019   Urinary leakage 09/02/2019   Elevated blood pressure reading 05/06/2019   Ischemic stroke diagnosed during current admission ([place]) 05/05/2019   Anxiety about health 05/05/2019   Paresthesia of left arm and leg 04/29/2019   Chronic deep vein thrombosis (DVT) of femoral vein of left lower extremity ([place]) 03/26/2019   Leg swelling 03/17/2019   Abnormal laboratory test 03/17/2019   Type 2 diabetes mellitus  without complications ([place]) 99991111   Smoking 01/18/2018   DVT (deep venous thrombosis) ([place]) 12/23/2017   PCP: None reported  REFERRING PROVIDER: Dr. Kai Levins, MD  ONSET DATE: 2020/chronic  REFERRING DIAG: L leg weakness, imbalance, gait abnormality  THERAPY DIAG:  Imbalance  Gait abnormality  Rationale for Evaluation and Treatment: Rehabilitation  FROM INITIAL EVALUATION SUBJECTIVE:  SUBJECTIVE STATEMENT: Pt states she is having difficulty with her balance; especially while walking, during transfers from sit to stand, and also getting out of bed.  She has significantly limited her activity because of her balance.  She is fearful of falling.  She is having difficulty with navigating uneven surfaces (yard) to get out to her greenhouse area on her property.  She started using a SPC a few months (since November 2023) because of a near fall episode.  She had hysterectomy in Dec 2022- started noticing decrease in activity around then and since becoming more sedentary her confidence in her balance has declined.  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  Navigating to step over a "gap" (ie: getting onto/off a train to visit her siblings) is scary.  She emphasizes her fear and lack of confidence with her balance multiple times during history taking.  F/u with neurology in 6 months. Pt accompanied by: significant other, his name is "Rich", he drives her to PT  PERTINENT HISTORY: Pt has h/o CVA (R ACA ischemic stroke) 2020;  hysterectomy Dec 2022  PAIN: Are you having pain? No "uncomfortable legs"  PRECAUTIONS: Fall  WEIGHT BEARING RESTRICTIONS: No  FALLS: Has patient fallen in last 6 months? Yes. Number of falls 1 "near fall"  LIVING ENVIRONMENT: Lives with:  lives with their family and lives with an adult companion Lives in: House/apartment Stairs: yes,3 to enter home, b/l railings on 1 set and no railing on 1 set Has following equipment at home: Single point cane  PLOF: Independent  PATIENT GOALS: to improve her balance, improve strength, to improve confidence with balance and strength again.  She would like to be able to walk on uneven surfaces in yard/at the beach, and go to a concert in [place]:   COGNITION:Overall cognitive status: Within functional limits for tasks assessed   SENSATION: Not tested  COORDINATION: Deferred  EDEMA: Deferred  POSTURE: rounded shoulders  LOWER EXTREMITY ROM: Deferred  LOWER EXTREMITY MMT:    MMT Right Eval Left Eval  Hip flexion 5 4  Hip extension    Hip abduction    Hip adduction    Hip internal rotation    Hip external rotation    Knee flexion 5 4  Knee extension 5 4  Ankle dorsiflexion 5 4  Ankle plantarflexion 5 4  Ankle inversion 5 4  Ankle eversion 5 4  (Blank rows = not tested)  BED MOBILITY:  Supine to sit SBA Rolling to Left SBA  TRANSFERS: Assistive device utilized: Single point cane  Sit to stand: Complete Independence Stand to sit: Complete Independence Chair to chair: Modified independence- uses hands for support Floor:  not assessed today- deferred   STAIRS: Deferred to visit #2  GAIT: Gait pattern: decreased step length L and R, decreased knee flexion L, small steps while turning 180 deg Distance walked: flat surfaces in clinic while holding cane and significant other's hand for "confidence" Assistive device utilized: Single point cane (SPC is a little long for her height) Level of assistance: Modified independence holds PT or significant other's hand for "confidence"   FUNCTIONAL TESTS:  OUTCOME MEASURES: TEST Outcome Interpretation  5 times sit<>stand 22 seconds >60 yo, >15 sec indicates increased risk for falls  10 meter walk test .71 m/s  (14 sec) <1.0 m/s indicates increased risk for falls; limited community ambulator  Chartered loss adjuster Deferred <36/56 (100% risk for falls), 37-45 (80%  risk for falls); 46-51 (>50% risk for falls); 52-55 (lower risk <25% of falls)  FOTO 49; 55 predicted visit 19 Predicted discharge score of %       TREATMENT   SUBJECTIVE:  Pt reports that she is doing alright today. She states that she did high marches the day after her last therapy session and was extremely sore afterward which has continued into today. No specific questions today.    PAIN: Generalized chronic widespread pain;   Ther-ex  NuStep L1-2 x 6.5 minutes for warm-up, LE strengthening, during interval history (2 minutes unbilled); Sit to stand from regular height chair with Airex pad on chair 2 x 10; Seated LAQ with 4# ankle weights x 10 BLE;  Standing hip strengthening with 4# ankle weights: HS curls x 10 BLE; Hip abduction x 10 BLE Hip extension x 10 BLE;  Standing heel raises with BUE support x 10; Side stepping with 4# AW x multiple lengths; Heel raises x 10;   Neuromuscular Re-education  Feet together (FT) eye open/closed x 30s each; FT horizontal and vertical head turns x 30s each; Obstacle course in // bars with 6" hurdles with faded UE support x 4 lengths; Airex feet apart (FA) static balance x 30s; Airex FA horizontal and vertical head turns x 30s each;   Not performed Semi tandem x30 sec each 6" alternating forward step-ups x 10 BLE; Stair negotiation 4 steps x 4 laps Mini squats x 10, cues for proper form/technique; Tandem balance alternating forward LE x 30s each; Tandem gait in // bars without UE support x 4 lengths; Alternating 6" toe taps x 10 BLE;   PATIENT EDUCATION: Education details: Pt educated throughout session about proper posture and technique with exercises. Improved exercise technique, movement at target joints, use of target muscles after min to mod verbal, visual, tactile  cues. Balance exercises. Person educated: Patient Education method: Explanation, Demonstration, and Verbal cues Education comprehension: verbalized understanding, returned demonstration, and verbal cues required   HOME EXERCISE PROGRAM: Access Code: [email or web address] URL: [email or web address] Date: 12/04/2022 Prepared by: Roxana Hires  Exercises - Romberg Stance  - 1 x daily - 7 x weekly - 3 reps - 30s hold - Romberg Stance with Head Rotation  - 1 x daily - 7 x weekly - 3 reps - 30s hold - Standing March with Counter Support  - 1 x daily - 7 x weekly - 2 sets - 10 reps - 3s hold   GOALS: Goals reviewed with patient? Yes  SHORT TERM GOALS: Target date: 11/29/22  Pt will demonstrate ability to transfer supine to sidelying to sitting independently Baseline: CGA/SBA during initial evaluation today, pt lacks confidence too Goal status: INITIAL    LONG TERM GOALS: Target date: 01/24/23  Improve L LE strength 1/2 MMT to facilitate improved ability to ambulate on even and uneven surfaces x 15 minutes consecutively at home and in her yard Baseline: 4/5 Goal status: INITIAL  2.  Improve FOTO to >55 indicating an improved ability to perform her activities at [facility] Baseline: 49 Goal status: INITIAL  3.  Pt will be able to perform 5x STS in <15 seconds indicating reduced fall risk Baseline: 22 sec Goal status: INITIAL  4.  Pt will improve BERG by at least 3 points in order to demonstrate clinically significant improvement in balance.   Baseline: 38/56; Goal status: INITIAL   ASSESSMENT:  CLINICAL IMPRESSION: Pt is agreeable to participate in therapy services. Progressed both balance and strength exercises during session. Pt  able to perform all therapeutic interventions requiring intermittent rest breaks d/t fatigue. No HEP updates at this time. Pt will benefit from PT services to address deficits in strength, balance, and mobility in order to return to full function at home  and decrease her risk for falls.      OBJECTIVE IMPAIRMENTS: Abnormal gait, decreased activity tolerance, decreased balance, difficulty walking, and decreased strength.   ACTIVITY LIMITATIONS: lifting, standing, stairs, transfers, bed mobility, and locomotion level  PARTICIPATION LIMITATIONS: cleaning, interpersonal relationship, driving, community activity, occupation, and yard work  PERSONAL FACTORS: Past/current experiences, Time since onset of injury/illness/exacerbation, and 1-2 comorbidities: h/o CVA in 2020  are also affecting patient's functional outcome.   REHAB POTENTIAL: Good  CLINICAL DECISION MAKING: Stable/uncomplicated  EVALUATION COMPLEXITY: Low  PLAN:  PT FREQUENCY: 2x/week  PT DURATION: 8 weeks  PLANNED INTERVENTIONS: Therapeutic exercises, Therapeutic activity, Neuromuscular re-education, Balance training, Gait training, Patient/Family education, Self Care, and Joint mobilization  PLAN FOR NEXT SESSION: Complete ABC with patient, progress balance/LE strengthening exercises, review/modify HEP as needed;  Lyndel Safe Dakiyah Heinke PT, DPT, GCS  Oda Lansdowne, PT 12/25/2022, 1:36 PM

## 2022-12-25 ENCOUNTER — Telehealth: Payer: Self-pay

## 2022-12-25 ENCOUNTER — Ambulatory Visit: Payer: Medicaid Other

## 2022-12-25 DIAGNOSIS — R269 Unspecified abnormalities of gait and mobility: Secondary | ICD-10-CM | POA: Diagnosis not present

## 2022-12-25 DIAGNOSIS — R2689 Other abnormalities of gait and mobility: Secondary | ICD-10-CM | POA: Diagnosis not present

## 2022-12-25 DIAGNOSIS — R29898 Other symptoms and signs involving the musculoskeletal system: Secondary | ICD-10-CM | POA: Diagnosis not present

## 2022-12-25 NOTE — Telephone Encounter (Signed)
End of Therapy letter for [facility] sent. Will follow up 01/08/2023.

## 2022-12-31 ENCOUNTER — Ambulatory Visit: Payer: Medicaid Other | Admitting: Licensed Clinical Social Worker

## 2023-01-01 ENCOUNTER — Ambulatory Visit: Payer: Medicaid Other | Admitting: Licensed Clinical Social Worker

## 2023-01-01 ENCOUNTER — Ambulatory Visit: Payer: Self-pay | Admitting: Licensed Clinical Social Worker

## 2023-01-01 DIAGNOSIS — F4323 Adjustment disorder with mixed anxiety and depressed mood: Secondary | ICD-10-CM

## 2023-01-01 NOTE — BH Specialist Note (Signed)
Integrated Behavioral Health via Telemedicine Visit  01/01/2023 Natalie Frey II:[id number]  Number of [place] Clinician visits: No data recorded Session Start time: No data recorded  Session End time: No data recorded Total time in minutes: No data recorded  Referring Provider: Carlyon Shadow, NP  Patient/Family location: The Patient's Home Address [facility] Provider location: [facility] [place] All persons participating in visit: Natalie Frey and Natalie Cairo, LCSW-A Types of Service: Telephone visit  I connected with [name] via  Telephone or Video Enabled Telemedicine Application  (Video is Caregility application) and verified that I am speaking with the correct person using two identifiers. Discussed confidentiality: Yes   I discussed the limitations of telemedicine and the availability of in person appointments.  Discussed there is a possibility of technology failure and discussed alternative modes of communication if that failure occurs.  Patient and/or legal guardian expressed understanding and consented to Telemedicine visit: Yes   Presenting Concerns: Patient and/or family reports the following symptoms/concerns: The patient reports that she has been doing well since her last follow up appointment.  Krissandra noted that she continues to take 22.5 MG of mirtazapine at bedtime; and feels it improves her overall mood and sleep. She explained that she only has 12 doses left and is unable to see her new primary care provider until April 22. The patient discussed situational stressors impacting her life currently.  She wished the therapist well, and stated she would call if she needed further assistance establishing care with a new mental health provider. Kassydi denied any current sleep disturbance, suicidal or homicidal thoughts. Duration of problem: Years; Severity of problem: mild  Patient and/or Family's Strengths/Protective Factors: Social connections,  Concrete supports in place (healthy food, safe environments, etc.), Sense of purpose, and Physical Health (exercise, healthy diet, medication compliance, etc.)  Goals Addressed: Patient will:  Reduce symptoms of: agitation, anxiety, depression, insomnia, and stress   Increase knowledge and/or ability of: coping skills, healthy habits, self-management skills, and stress reduction   Demonstrate ability to: Increase healthy adjustment to current life circumstances  Progress towards Goals: Discontinued  Interventions: Interventions utilized:  Supportive Counseling and Link to [employer] was utilized by [employer] during today's follow up appointment.The clinician met with the patient to conclude therapy and transition the patient to another provider, monitor for signs and symptoms of anxiety and depression, and assess safety. The clinician asked the patient how they had been doing since the last follow-up session. The clinician processed with the patient their progress in therapy provided a safe, judgmental-free space for the patient to express their feelings regarding ending therapy, and addressed any of the patient's questions and concerns. The clinician provided the patient with a referral to [facility] and instructions on establishing care. Clinician provided the patient with crisis resources should the patient need further support during this transition. The clinician monitored the patient's use and satisfaction with her prescribed psychotropic medication and determined the patient reported taking her medication exactly as prescribed. The clinician explained that the patient may be able to access a telehealth provider through my chart to help bridge her care until she can see her new primary care provider.   Patient and/or Family Response: Patient expressed understanding and agreed with the plan.  Assessment: Patient currently experiencing see above.   Patient may benefit from see  above.  Plan: Follow up with behavioral health clinician on :  Behavioral recommendations:  Referral(s): [place] (In Clinic)  referred to [place]  I discussed the assessment and treatment plan with the patient and/or parent/guardian. They were provided an opportunity to ask questions and all were answered. They agreed with the plan and demonstrated an understanding of the instructions.   They were advised to call back or seek an in-person evaluation if the symptoms worsen or if the condition fails to improve as anticipated.  Lesli Albee, LCSWA

## 2023-01-02 NOTE — Therapy (Signed)
OUTPATIENT PHYSICAL THERAPY NEURO TREATMENT  Patient Name: Natalie Frey MRN: [id number] DOB:October 14, 1955, 67 y.o., female Today's Date: 01/04/2023  END OF SESSION:  PT End of Session - 01/03/23 0855     Visit Number 8    Number of Visits 17    Date for PT Re-Evaluation 01/24/23    Authorization Type eval: 11/29/22    PT Start Time 0849    PT Stop Time 0930    PT Time Calculation (min) 41 min    Equipment Utilized During Treatment Gait belt    Activity Tolerance Patient tolerated treatment well    Behavior During Therapy [facility] for tasks assessed/performed            Past Medical History:  Diagnosis Date   Cancer    bladder (10 years ago, per patient)   Dvt femoral (deep venous thrombosis)    Hyperlipidemia    Paresthesia    Prediabetes    Stroke    July 2020, per patient   Type 2 diabetes mellitus without complications 03/17/2019   Past Surgical History:  Procedure Laterality Date   ABDOMINAL HYSTERECTOMY  08/2021   BLADDER SURGERY     COLONOSCOPY WITH PROPOFOL N/A 02/11/2020   Procedure: COLONOSCOPY WITH PROPOFOL;  Surgeon: Wyline Mood, MD;  Location: [facility] ENDOSCOPY;  Service: Gastroenterology;  Laterality: N/A;   DILATION AND EVACUATION     Patient Active Problem List   Diagnosis Date Noted   Paresthesia of left leg 04/25/2021   Skin mole 04/25/2021   History of DVT of lower extremity 02/28/2021   Intermittent claudication 02/23/2021   Ganglion cyst of wrist 02/23/2021   Prediabetes 12/08/2019   Health care maintenance 10/05/2019   Urinary leakage 09/02/2019   Elevated blood pressure reading 05/06/2019   Ischemic stroke diagnosed during current admission 05/05/2019   Anxiety about health 05/05/2019   Paresthesia of left arm and leg 04/29/2019   Chronic deep vein thrombosis (DVT) of femoral vein of left lower extremity 03/26/2019   Leg swelling 03/17/2019   Abnormal laboratory test 03/17/2019   Type 2 diabetes mellitus without complications 03/17/2019   Smoking  01/18/2018   DVT (deep venous thrombosis) 12/23/2017   PCP: None reported  REFERRING PROVIDER: Dr. Jacquelyne Balint, MD  ONSET DATE: 2020/chronic  REFERRING DIAG: L leg weakness, imbalance, gait abnormality  THERAPY DIAG:  Unsteadiness on feet  Muscle weakness (generalized)  Rationale for Evaluation and Treatment: Rehabilitation  FROM INITIAL EVALUATION SUBJECTIVE:  SUBJECTIVE STATEMENT: Pt states she is having difficulty with her balance; especially while walking, during transfers from sit to stand, and also getting out of bed.  She has significantly limited her activity because of her balance.  She is fearful of falling.  She is having difficulty with navigating uneven surfaces (yard) to get out to her greenhouse area on her property.  She started using a SPC a few months (since November 2023) because of a near fall episode.  She had hysterectomy in Dec 2022- started noticing decrease in activity around then and since becoming more sedentary her confidence in her balance has declined.  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  Navigating to step over a "gap" (ie: getting onto/off a train to visit her siblings) is scary.  She emphasizes her fear and lack of confidence with her balance multiple times during history taking.  F/u with neurology in 6 months. Pt accompanied by: significant other, his name is "Rich", he drives her to PT  PERTINENT HISTORY: Pt has h/o CVA (R ACA ischemic stroke) 2020;  hysterectomy Dec 2022  PAIN: Are you having pain? No "uncomfortable legs"  PRECAUTIONS: Fall  WEIGHT BEARING RESTRICTIONS: No  FALLS: Has patient fallen in last 6 months? Yes. Number of falls 1 "near fall"  LIVING ENVIRONMENT: Lives with: lives with their family and lives  with an adult companion Lives in: House/apartment Stairs: yes,3 to enter home, b/l railings on 1 set and no railing on 1 set Has following equipment at home: Single point cane  PLOF: Independent  PATIENT GOALS: to improve her balance, improve strength, to improve confidence with balance and strength again.  She would like to be able to walk on uneven surfaces in yard/at the beach, and go to a concert in [place]:   COGNITION:Overall cognitive status: Within functional limits for tasks assessed   SENSATION: Not tested  COORDINATION: Deferred  EDEMA: Deferred  POSTURE: rounded shoulders  LOWER EXTREMITY ROM: Deferred  LOWER EXTREMITY MMT:    MMT Right Eval Left Eval  Hip flexion 5 4  Hip extension    Hip abduction    Hip adduction    Hip internal rotation    Hip external rotation    Knee flexion 5 4  Knee extension 5 4  Ankle dorsiflexion 5 4  Ankle plantarflexion 5 4  Ankle inversion 5 4  Ankle eversion 5 4  (Blank rows = not tested)  BED MOBILITY:  Supine to sit SBA Rolling to Left SBA  TRANSFERS: Assistive device utilized: Single point cane  Sit to stand: Complete Independence Stand to sit: Complete Independence Chair to chair: Modified independence- uses hands for support Floor:  not assessed today- deferred   STAIRS: Deferred to visit #2  GAIT: Gait pattern: decreased step length L and R, decreased knee flexion L, small steps while turning 180 deg Distance walked: flat surfaces in clinic while holding cane and significant other's hand for "confidence" Assistive device utilized: Single point cane (SPC is a little long for her height) Level of assistance: Modified independence holds PT or significant other's hand for "confidence"   FUNCTIONAL TESTS:  OUTCOME MEASURES: TEST Outcome Interpretation  5 times sit<>stand 22 seconds >60 yo, >15 sec indicates increased risk for falls  10 meter walk test .71 m/s (14 sec) <1.0 m/s indicates  increased risk for falls; limited community ambulator  Chartered loss adjuster Deferred <36/56 (100% risk for falls), 37-45 (80%  risk for falls); 46-51 (>50% risk for falls); 52-55 (lower risk <25% of falls)  FOTO 49; 55 predicted visit 19 Predicted discharge score of %       TREATMENT   SUBJECTIVE:  Pt reports that she is doing alright today however feels like her confidence in her balance has decreased recently. She denies any falls since the last therapy session. No specific questions today.    PAIN: Generalized chronic widespread pain;   Ther-ex  NuStep L1-2 x 6 minutes for warm-up, LE strengthening, during interval history (2 minutes unbilled); Mini squats x 10; Standing heel raises with BUE support x 10; Seated LAQ with 4# ankle weights x 10 BLE;  Standing hip strengthening with 4# ankle weights: Hip flexion marches x 10 BLE; HS curls x 10 BLE; Hip abduction x 10 BLE Hip extension x 10 BLE;   Neuromuscular Re-education  Feet together (FT) eye open/closed x 30s each; FT horizontal and vertical head turns x 30s each; Semitandem balance alternating forward LE x 30s each; Alternating 6" toe taps x 10 BLE; ABC: 45.6%   Not performed 6" alternating forward step-ups x 10 BLE; Stair negotiation 4 steps x 4 laps Tandem balance alternating forward LE x 30s each; Tandem gait in // bars without UE support x 4 lengths; Side stepping with 4# AW x multiple lengths; Obstacle course in // bars with 6" hurdles with faded UE support x 4 lengths; Airex feet apart (FA) static balance x 30s; Airex FA horizontal and vertical head turns x 30s each;    PATIENT EDUCATION: Education details: Pt educated throughout session about proper posture and technique with exercises. Improved exercise technique, movement at target joints, use of target muscles after min to mod verbal, visual, tactile cues. Balance exercises. Person educated: Patient Education method: Explanation, Demonstration, and  Verbal cues Education comprehension: verbalized understanding, returned demonstration, and verbal cues required   HOME EXERCISE PROGRAM: Access Code: [email or web address] URL: [email or web address] Date: 12/04/2022 Prepared by: Ria Comment  Exercises - Romberg Stance  - 1 x daily - 7 x weekly - 3 reps - 30s hold - Romberg Stance with Head Rotation  - 1 x daily - 7 x weekly - 3 reps - 30s hold - Standing March with Counter Support  - 1 x daily - 7 x weekly - 2 sets - 10 reps - 3s hold   GOALS: Goals reviewed with patient? Yes  SHORT TERM GOALS: Target date: 11/29/22  Pt will demonstrate ability to transfer supine to sidelying to sitting independently Baseline: CGA/SBA during initial evaluation today, pt lacks confidence too Goal status: INITIAL    LONG TERM GOALS: Target date: 01/24/23  Improve L LE strength 1/2 MMT to facilitate improved ability to ambulate on even and uneven surfaces x 15 minutes consecutively at home and in her yard Baseline: 4/5 Goal status: INITIAL  2.  Improve FOTO to >55 indicating an improved ability to perform her activities at [facility] Baseline: 49 Goal status: INITIAL  3.  Pt will be able to perform 5x STS in <15 seconds indicating reduced fall risk Baseline: 22 sec Goal status: INITIAL  4.  Pt will improve BERG by at least 3 points in order to demonstrate clinically significant improvement in balance.   Baseline: 38/56; Goal status: INITIAL  4.  Pt will improve ABC by at least 13% in order to demonstrate clinically significant improvement in balance confidence.    Baseline: 01/03/23: 45.6% Goal status: INITIAL    ASSESSMENT:  CLINICAL IMPRESSION: Pt is  agreeable to participate in therapy services. Progressed both balance and strength exercises during session. Pt able to perform all therapeutic interventions requiring intermittent rest breaks d/t fatigue. Completed ABC with pt who scored 45.6% indicating low balance confidence. No HEP  updates at this time. Pt will benefit from PT services to address deficits in strength, balance, and mobility in order to return to full function at home and decrease her risk for falls.      OBJECTIVE IMPAIRMENTS: Abnormal gait, decreased activity tolerance, decreased balance, difficulty walking, and decreased strength.   ACTIVITY LIMITATIONS: lifting, standing, stairs, transfers, bed mobility, and locomotion level  PARTICIPATION LIMITATIONS: cleaning, interpersonal relationship, driving, community activity, occupation, and yard work  PERSONAL FACTORS: Past/current experiences, Time since onset of injury/illness/exacerbation, and 1-2 comorbidities: h/o CVA in 2020  are also affecting patient's functional outcome.   REHAB POTENTIAL: Good  CLINICAL DECISION MAKING: Stable/uncomplicated  EVALUATION COMPLEXITY: Low  PLAN:  PT FREQUENCY: 2x/week  PT DURATION: 8 weeks  PLANNED INTERVENTIONS: Therapeutic exercises, Therapeutic activity, Neuromuscular re-education, Balance training, Gait training, Patient/Family education, Self Care, and Joint mobilization  PLAN FOR NEXT SESSION: progress balance/LE strengthening exercises, review/modify HEP as needed;  Sharalyn Ink Daelen [place]ere PT, DPT, GCS  Qadir Folks, PT 01/04/2023, 1:10 PM

## 2023-01-02 NOTE — Progress Notes (Deleted)
NEUROLOGY FOLLOW UP OFFICE NOTE  Natalie Frey:[id number]  Subjective:  Natalie Frey is a 67 y.o. year old right-handed female with a medical history of right ACA ischemic stroke (~2020), DM2, HLD, DVT, vit D deficiency, former smoker who we last saw on 11/15/22.  To briefly review: Patient had acute onset left sided weakness in 2020. She was eventually diagnosed with a right ACA ischemic stroke. She had virtual PT once due to COVID. She was doing okay until her hysterectomy (08/2021). After this surgery, she became much less physically active. She had a near fall in 08/2022 where she lost her balance. She started walking with a cane about 3-4 months ago.   She was having back pain and achiness about 1 year ago, so she was prescribed gabapentin. She was on gabapentin 100 mg qhs, which did not help, so she stopped it. She feels like her left lower leg is heavy. She denies tingling, burning. She has some low back pain with some radiation into left leg.   Patient previously saw neurology at [facility] (07/22/2019, Dr. Roanna Raider) after her stroke. Per clinic note from 07/22/2019: Natalie Frey is a 67 y.o. right handed female with a history of T2DM, tobacco use being evaluated in consultation at the [facility] for follow up of recent R ACA ischemic stroke.   The patient's symptoms started on 04/26/19. She was at work that day and was bending over to pick up things on the floor when she fell over.  Since then, she has been noticing the weakness and numbness in the left upper and lower extremity, more significantly in left lower extremity.  She went to outside hospital on 04/27/19 but eventually left after waiting for 3 hours without being seen.  Her symptoms failed to improve and worsened and so she presented to [facility] for evaluation.  CT head revealed right frontal lobe infarct in the ACA territory. 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   NIHSS on admission was 2. PT and OT were consulted. PT recommended 5x high but patient wanted to go home with home health. ASA 81mg  and atorvastatin 80 mg were started. She was discharged home with a walker and bedside commode as well as home health and tobacco cessation resources and outpatient referral.   Most recent Assessment and Plan (11/15/22):  Her neurological examination is pertinent for subtle weakness of the left lower extremity and gait with difficulty clearing left toes when stepping through. Available diagnostic data is significant for CT head showing right frontal lobe infarct and CTA showing complete occlusion of the right ICA at level of carotid bifurcation by report (both studies from 2020, external). Patient's symptoms are most consistent with the residuals of weakness from her left frontal infarct in 2020 in the right ACA territory. A right lumbosacral radiculopathy is possible, but exam is not concerning for this currently. I think her symptoms have worsened over the last year due to deconditioning from physical inactivity since hysterectomy. I would like to start with physical therapy to help with left sided weakness, gait retraining, and help her get confidence in her walking again.   PLAN: -Continue Vit D supplementation -Physical therapy referral -Continue aspirin 81 mg daily -Continue atorvastatin 40 mg daily -Discussed gabapentin, but given that patient has minimal paresthesia that is not bothering her too badly currently, will hold off as this can contribute to imbalance.  Since their last visit: ***  Patient is doing PT. ***  MEDICATIONS:  Outpatient Encounter Medications as of 01/09/2023  Medication Sig Note   acetaminophen (TYLENOL) 325 MG tablet Take 2 tablets (650 mg total) by mouth every 6 (six) hours as needed for mild pain (or Fever >/= 101). 07/13/2020: Patient takes 2-3 tablets a week as needed   aspirin EC 81 MG tablet Take 1 tablet  (81 mg total) by mouth once daily.    atorvastatin (LIPITOR) 40 MG tablet TAKE TWO TABLETS BY MOUTH ONCE EVERY DAY AT 6:00PM    glipiZIDE 2.5 MG TABS Take 2.5 mg by mouth daily with supper. 10/25/2022: Patient takes if blood sugar is >130   mirabegron ER (MYRBETRIQ) 50 MG TB24 tablet Take 1 tablet (50 mg total) by mouth once daily. 10/18/2022: Patient takes 2-3 times a week   mirtazapine (REMERON) 15 MG tablet Take 1.5 tablets (22.5 mg total) by mouth at bedtime.    Vitamin D, Ergocalciferol, (DRISDOL) 1.25 MG (50000 UNIT) CAPS capsule Take 1 capsule (50,000 Units total) by mouth every 7 (seven) days.    No facility-administered encounter medications on file as of 01/09/2023.    PAST MEDICAL HISTORY: Past Medical History:  Diagnosis Date   Cancer ([place])    bladder (10 years ago, per patient)   Dvt femoral (deep venous thrombosis) ([place])    Hyperlipidemia    Paresthesia    Prediabetes    Stroke [facility])    July 2020, per patient   Type 2 diabetes mellitus without complications ([place]) 99991111    PAST SURGICAL HISTORY: Past Surgical History:  Procedure Laterality Date   ABDOMINAL HYSTERECTOMY  08/2021   BLADDER SURGERY     COLONOSCOPY WITH PROPOFOL N/A 02/11/2020   Procedure: COLONOSCOPY WITH PROPOFOL;  Surgeon: Jonathon Bellows, MD;  Location: [facility] ENDOSCOPY;  Service: Gastroenterology;  Laterality: N/A;   DILATION AND EVACUATION      ALLERGIES: Allergies  Allergen Reactions   Metformin And Related Diarrhea    Severe diarrhea    FAMILY HISTORY: Family History  Problem Relation Age of Onset   Diabetes Mother    Heart disease Mother    Stroke Mother    Mesothelioma Father    Hypertension Sister    Pulmonary embolism Sister        while in hospital; other sister- on BCPs.    Breast cancer Sister 42   Hypertension Sister    Hyperlipidemia Sister    Cancer Sister    Aneurysm Brother        brain   Stroke Brother    Other Brother        unknown medical history   Deep vein  thrombosis Daughter         when pregnant     SOCIAL HISTORY: Social History   Tobacco Use   Smoking status: Former    Packs/day: 0.25    Years: 47.00    Additional pack years: 0.00    Total pack years: 11.75    Types: Cigarettes    Quit date: 01/2022    Years since quitting: 0.9   Smokeless tobacco: Never   Tobacco comments:    1 pack in 4 days; was able to quit for awhile. Patient stills has an occasional cigarettes, but  uses the Nicoderm patches prn Patient has now quit smoking  Vaping Use   Vaping Use: Never used  Substance Use Topics   Alcohol use: Not Currently    Comment: last use 11/2020  Drug use: Not Currently    Types: Marijuana, Cocaine    Comment: last use 11/14/20 (marijuana)   Social History   Social History Narrative   [place]; smoking; 1ppd/cutting down. Social. Worked for [employer] from [place].       Said she has been doing ok and does not need any assistance          - is currently on food stamps but those have been enough       .[facility] consent form signed    Are you right handed or left handed? RIGHT   Are you currently employed ?    What is your current occupation?RETIRED   Do you live at home alone?   Who lives with you? boyfriend   What type of home do you live in: 1 story or 2 story? one    Caffeine 1-2 cup in AM      Objective:  Vital Signs:  There were no vitals taken for this visit.  ***  Labs and Imaging review: No new results***  Previously reviewed results: Lab Results  Component Value Date    HGBA1C 6.6 (H) 10/18/2022      Recent Labs       Lab Results  Component Value Date    VITAMINB12 730 10/18/2022      Recent Labs       Lab Results  Component Value Date    TSH 1.840 04/17/2018      Recent Labs[] Expand by Default  No results found for: "ESRSEDRATE", "POCTSEDRATE"     Vit D (10/18/22): 6.1   Component     Latest Ref Rng 10/18/2022  Cholesterol, Total     100 - 199 mg/dL 127   Triglycerides      0 - 149 mg/dL 115   HDL Cholesterol     >39 mg/dL 40   VLDL Cholesterol Cal     5 - 40 mg/dL 21   LDL Chol Calc (NIH)     0 - 99 mg/dL 66   Total CHOL/HDL Ratio     0.0 - 4.4 ratio 3.2     Hypercoag work up for DVT - 04/09/2019: Component     Latest Ref Rng 04/09/2019  Beta-2 Glycoprotein I Ab, IgG     0 - 20 GPI IgG units <9   Beta-2-Glycoprotein I IgM     0 - 32 GPI IgM units <9   Beta-2-Glycoprotein I IgA     0 - 25 GPI IgA units <9   Anticardiolipin Ab,IgG,Qn     0 - 14 GPL U/mL 9   Anticardiolipin Ab,IgM,Qn     0 - 12 MPL U/mL <9   Anticardiolipin Ab,IgA,Qn     0 - 11 APL U/mL <9   Fibrin derivatives D-dimer ([facility])     0.00 - 499.00 ng/mL (FEU) 716.54 (H)   Protein S, Total     60 - 150 % 98   Protein C, Total     60 - 150 % 119   Antithrombin Activity     75 - 120 % 114   Recommendations-F5LEID: Comment   Recommendations-PTGENE: Comment       External labs: Labs:  LDL 100.1  Hemoglobin A1c 6.6 (03/11/19)  CTA Neck (04/29/19): 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 0.5 cm right upper lobe groundglass nodule. Attention on follow-up.    Imaging: CTH wo contrast (external, 04/29/2019): FINDINGS:  There is no midline shift.  Right frontal hypodense region, involving anterior superior and middle frontal gyri, is concerning for acute infarct. No acute intracranial hemorrhage. No fractures are evident. The sinuses are pneumatized.   IMPRESSION:  Right frontal lobe acute infarct, ACA territory.    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 No evidence of dissection or aneurysm in the carotid or vertebral arteries.   The soft tissues are unremarkable without lymphadenopathy, mass or abscess. The visualized osseous structures are unremarkable. The airway is patent and midline. 0.5 cm groundglass nodule within the right upper lobe  (6:149).   In this report, if stenosis of the internal carotid artery is reported, it is calculated based on the NASCET method: (1 - (minimal residual diameter/normal diameter of distal ICA)).   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   -0.5 cm right upper lobe groundglass nodule. Attention on follow-up.  Assessment/Plan:  [name], a 67 y.o. female with: ***   Plan: ***  Return to clinic in ***  Total time spent reviewing records, interview, history/exam, documentation, and coordination of care on day of encounter:  *** min  Kai Levins, MD

## 2023-01-03 ENCOUNTER — Ambulatory Visit: Payer: Medicaid Other | Attending: Neurology

## 2023-01-03 DIAGNOSIS — M6281 Muscle weakness (generalized): Secondary | ICD-10-CM | POA: Insufficient documentation

## 2023-01-03 DIAGNOSIS — R2681 Unsteadiness on feet: Secondary | ICD-10-CM | POA: Diagnosis not present

## 2023-01-07 NOTE — Therapy (Signed)
OUTPATIENT PHYSICAL THERAPY NEURO TREATMENT  Patient Name: Natalie Frey MRN: [id number] DOB:1956-08-17, 67 y.o., female Today's Date: 01/08/2023  END OF SESSION:  PT End of Session - 01/08/23 0855     Visit Number 9    Number of Visits 17    Date for PT Re-Evaluation 01/24/23    Authorization Type eval: 11/29/22    PT Start Time 0849    PT Stop Time 0930    PT Time Calculation (min) 41 min    Equipment Utilized During Treatment Gait belt    Activity Tolerance Patient tolerated treatment well    Behavior During Therapy [facility] for tasks assessed/performed            Past Medical History:  Diagnosis Date   Cancer    bladder (10 years ago, per patient)   Dvt femoral (deep venous thrombosis)    Hyperlipidemia    Paresthesia    Prediabetes    Stroke    July 2020, per patient   Type 2 diabetes mellitus without complications 03/17/2019   Past Surgical History:  Procedure Laterality Date   ABDOMINAL HYSTERECTOMY  08/2021   BLADDER SURGERY     COLONOSCOPY WITH PROPOFOL N/A 02/11/2020   Procedure: COLONOSCOPY WITH PROPOFOL;  Surgeon: Wyline Mood, MD;  Location: [facility] ENDOSCOPY;  Service: Gastroenterology;  Laterality: N/A;   DILATION AND EVACUATION     Patient Active Problem List   Diagnosis Date Noted   Paresthesia of left leg 04/25/2021   Skin mole 04/25/2021   History of DVT of lower extremity 02/28/2021   Intermittent claudication 02/23/2021   Ganglion cyst of wrist 02/23/2021   Prediabetes 12/08/2019   Health care maintenance 10/05/2019   Urinary leakage 09/02/2019   Elevated blood pressure reading 05/06/2019   Ischemic stroke diagnosed during current admission 05/05/2019   Anxiety about health 05/05/2019   Paresthesia of left arm and leg 04/29/2019   Chronic deep vein thrombosis (DVT) of femoral vein of left lower extremity 03/26/2019   Leg swelling 03/17/2019   Abnormal laboratory test 03/17/2019   Type 2 diabetes mellitus without complications 03/17/2019   Smoking  01/18/2018   DVT (deep venous thrombosis) 12/23/2017   PCP: None reported  REFERRING PROVIDER: Dr. Jacquelyne Balint, MD  ONSET DATE: 2020/chronic  REFERRING DIAG: L leg weakness, imbalance, gait abnormality  THERAPY DIAG:  Unsteadiness on feet  Muscle weakness (generalized)  Rationale for Evaluation and Treatment: Rehabilitation  FROM INITIAL EVALUATION SUBJECTIVE:  SUBJECTIVE STATEMENT: Pt states she is having difficulty with her balance; especially while walking, during transfers from sit to stand, and also getting out of bed.  She has significantly limited her activity because of her balance.  She is fearful of falling.  She is having difficulty with navigating uneven surfaces (yard) to get out to her greenhouse area on her property.  She started using a SPC a few months (since November 2023) because of a near fall episode.  She had hysterectomy in Dec 2022- started noticing decrease in activity around then and since becoming more sedentary her confidence in her balance has declined.  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  Navigating to step over a "gap" (ie: getting onto/off a train to visit her siblings) is scary.  She emphasizes her fear and lack of confidence with her balance multiple times during history taking.  F/u with neurology in 6 months. Pt accompanied by: significant other, his name is "Rich", he drives her to PT  PERTINENT HISTORY: Pt has h/o CVA (R ACA ischemic stroke) 2020;  hysterectomy Dec 2022  PAIN: Are you having pain? No "uncomfortable legs"  PRECAUTIONS: Fall  WEIGHT BEARING RESTRICTIONS: No  FALLS: Has patient fallen in last 6 months? Yes. Number of falls 1 "near fall"  LIVING ENVIRONMENT: Lives with: lives with their family and lives  with an adult companion Lives in: House/apartment Stairs: yes,3 to enter home, b/l railings on 1 set and no railing on 1 set Has following equipment at home: Single point cane  PLOF: Independent  PATIENT GOALS: to improve her balance, improve strength, to improve confidence with balance and strength again.  She would like to be able to walk on uneven surfaces in yard/at the beach, and go to a concert in [place]  OBJECTIVE:   COGNITION:Overall cognitive status: Within functional limits for tasks assessed   SENSATION: Not tested  COORDINATION: Deferred  EDEMA: Deferred  POSTURE: rounded shoulders  LOWER EXTREMITY ROM: Deferred  LOWER EXTREMITY MMT:    MMT Right Eval Left Eval  Hip flexion 5 4  Hip extension    Hip abduction    Hip adduction    Hip internal rotation    Hip external rotation    Knee flexion 5 4  Knee extension 5 4  Ankle dorsiflexion 5 4  Ankle plantarflexion 5 4  Ankle inversion 5 4  Ankle eversion 5 4  (Blank rows = not tested)  BED MOBILITY:  Supine to sit SBA Rolling to Left SBA  TRANSFERS: Assistive device utilized: Single point cane  Sit to stand: Complete Independence Stand to sit: Complete Independence Chair to chair: Modified independence- uses hands for support Floor:  not assessed today- deferred   STAIRS: Deferred to visit #2  GAIT: Gait pattern: decreased step length L and R, decreased knee flexion L, small steps while turning 180 deg Distance walked: flat surfaces in clinic while holding cane and significant other's hand for "confidence" Assistive device utilized: Single point cane (SPC is a little long for her height) Level of assistance: Modified independence holds PT or significant other's hand for "confidence"   FUNCTIONAL TESTS:  OUTCOME MEASURES: TEST Outcome Interpretation  5 times sit<>stand 22 seconds >60 yo, >15 sec indicates increased risk for falls  10 meter walk test .71 m/s (14 sec) <1.0 m/s indicates  increased risk for falls; limited community ambulator  Nurse, learning disability Deferred <36/56 (100% risk for falls), 37-45 (80%  risk for falls); 46-51 (>50% risk for falls); 52-55 (lower risk <25% of falls)  FOTO 49; 55 predicted visit 19 Predicted discharge score of %       TREATMENT   SUBJECTIVE:  Pt reports that she is very tired today and didn't sleep well last night. She continues to experience BLE weakness and instability. She denies any falls since the last therapy session. No specific questions today.    PAIN: Generalized chronic widespread pain;   Ther-ex  NuStep L1 x 4 minutes for warm-up, LE strengthening, during interval history (2 minutes unbilled); Mini squats x 10; Standing heel raises with BUE support x 10; Seated LAQ with 5# ankle weights x 10 BLE;  Standing hip strengthening with 5# ankle weights: Hip flexion marches x 10 BLE; HS curls x 10 BLE; Hip abduction x 10 BLE Hip extension x 10 BLE;  6" alternating forward step-ups x 10 BLE; Side stepping with 5# AW x multiple lengths;   Neuromuscular Re-education  Alternating 6" toe taps x 10 BLE; Staggered stance balance with front foot on 6" step alternating x 30s each; Feet together (FT) eye open/closed x 30s each; FT horizontal and vertical head turns x 30s each;   Not performed Stair negotiation 4 steps x 4 laps Tandem balance alternating forward LE x 30s each; Tandem gait in // bars without UE support x 4 lengths; Obstacle course in // bars with 6" hurdles with faded UE support x 4 lengths; Airex feet apart (FA) static balance x 30s; Airex FA horizontal and vertical head turns x 30s each; Semitandem balance alternating forward LE x 30s each;   PATIENT EDUCATION: Education details: Pt educated throughout session about proper posture and technique with exercises. Improved exercise technique, movement at target joints, use of target muscles after min to mod verbal, visual, tactile cues. Balance  exercises. Person educated: Patient Education method: Explanation, Demonstration, and Verbal cues Education comprehension: verbalized understanding, returned demonstration, and verbal cues required   HOME EXERCISE PROGRAM: Access Code: [email or web address] URL: [email or web address] Date: 12/04/2022 Prepared by: Ria Comment  Exercises - Romberg Stance  - 1 x daily - 7 x weekly - 3 reps - 30s hold - Romberg Stance with Head Rotation  - 1 x daily - 7 x weekly - 3 reps - 30s hold - Standing March with Counter Support  - 1 x daily - 7 x weekly - 2 sets - 10 reps - 3s hold   GOALS: Goals reviewed with patient? Yes  SHORT TERM GOALS: Target date: 11/29/22  Pt will demonstrate ability to transfer supine to sidelying to sitting independently Baseline: CGA/SBA during initial evaluation today, pt lacks confidence too Goal status: INITIAL    LONG TERM GOALS: Target date: 01/24/23  Improve L LE strength 1/2 MMT to facilitate improved ability to ambulate on even and uneven surfaces x 15 minutes consecutively at home and in her yard Baseline: 4/5 Goal status: INITIAL  2.  Improve FOTO to >55 indicating an improved ability to perform her activities at [facility] Baseline: 49 Goal status: INITIAL  3.  Pt will be able to perform 5x STS in <15 seconds indicating reduced fall risk Baseline: 22 sec Goal status: INITIAL  4.  Pt will improve BERG by at least 3 points in order to demonstrate clinically significant improvement in balance.   Baseline: 38/56; Goal status: INITIAL  4.  Pt will improve ABC by at least 13% in order to demonstrate clinically significant improvement in balance confidence.    Baseline:  01/03/23: 45.6% Goal status: INITIAL   ASSESSMENT:  CLINICAL IMPRESSION: Pt is agreeable to participate in therapy services. She is slightly more fatigued today however with encouragement is able to complete an entire therapy session. Progressed both balance and strength exercises  during session. No HEP updates at this time. Plan to progress both balance and strength exercises during future sessions.  Pt will benefit from PT services to address deficits in strength, balance, and mobility in order to return to full function at home and decrease her risk for falls.      OBJECTIVE IMPAIRMENTS: Abnormal gait, decreased activity tolerance, decreased balance, difficulty walking, and decreased strength.   ACTIVITY LIMITATIONS: lifting, standing, stairs, transfers, bed mobility, and locomotion level  PARTICIPATION LIMITATIONS: cleaning, interpersonal relationship, driving, community activity, occupation, and yard work  PERSONAL FACTORS: Past/current experiences, Time since onset of injury/illness/exacerbation, and 1-2 comorbidities: h/o CVA in 2020  are also affecting patient's functional outcome.   REHAB POTENTIAL: Good  CLINICAL DECISION MAKING: Stable/uncomplicated  EVALUATION COMPLEXITY: Low  PLAN:  PT FREQUENCY: 2x/week  PT DURATION: 8 weeks  PLANNED INTERVENTIONS: Therapeutic exercises, Therapeutic activity, Neuromuscular re-education, Balance training, Gait training, Patient/Family education, Self Care, and Joint mobilization  PLAN FOR NEXT SESSION: progress balance/LE strengthening exercises, review/modify HEP as needed;  Sharalyn Ink Vanna Shavers PT, DPT, GCS  Dannon Perlow, PT 01/08/2023, 12:40 PM

## 2023-01-08 ENCOUNTER — Ambulatory Visit: Payer: Medicaid Other

## 2023-01-08 DIAGNOSIS — R2681 Unsteadiness on feet: Secondary | ICD-10-CM

## 2023-01-08 DIAGNOSIS — M6281 Muscle weakness (generalized): Secondary | ICD-10-CM

## 2023-01-09 ENCOUNTER — Ambulatory Visit (INDEPENDENT_AMBULATORY_CARE_PROVIDER_SITE_OTHER): Payer: Medicaid Other | Admitting: Neurology

## 2023-01-09 ENCOUNTER — Ambulatory Visit: Payer: Medicaid Other | Admitting: Neurology

## 2023-01-09 ENCOUNTER — Encounter: Payer: Self-pay | Admitting: Neurology

## 2023-01-09 VITALS — BP 160/82 | HR 74 | Resp 20 | Ht 64.0 in | Wt 206.0 lb

## 2023-01-09 DIAGNOSIS — R2689 Other abnormalities of gait and mobility: Secondary | ICD-10-CM | POA: Diagnosis not present

## 2023-01-09 DIAGNOSIS — R202 Paresthesia of skin: Secondary | ICD-10-CM

## 2023-01-09 DIAGNOSIS — R29898 Other symptoms and signs involving the musculoskeletal system: Secondary | ICD-10-CM | POA: Diagnosis not present

## 2023-01-09 DIAGNOSIS — R258 Other abnormal involuntary movements: Secondary | ICD-10-CM | POA: Diagnosis not present

## 2023-01-09 DIAGNOSIS — R269 Unspecified abnormalities of gait and mobility: Secondary | ICD-10-CM

## 2023-01-09 DIAGNOSIS — Z8673 Personal history of transient ischemic attack (TIA), and cerebral infarction without residual deficits: Secondary | ICD-10-CM | POA: Diagnosis not present

## 2023-01-09 NOTE — Patient Instructions (Addendum)
- Recommend the following measures that may provide some symptomatic benefit for muscle twitching and/or cramps: - Adequate oral clear fluid intake to maintain optimal hydration (about 2.5 liters, or around 8-10 glasses per day) Avoidance of caffeine Trial of DIET tonic water: About 1 glass, up to 6 times daily Magnesium oxide up to 400 mg by mouth twice daily, as needed (over the counter) Gentle muscle stretching routine, especially before bedtime   Continue physical therapy, including home exercises.   Continue aspirin 81 mg daily and atorvastatin 40 mg daily to help prevent strokes.  Continue vitamin D supplementation.  Return to clinic as planned in May 16, 2023.  The physicians and staff at [facility] are committed to providing excellent care. You may receive a survey requesting feedback about your experience at our office. We strive to receive "very good" responses to the survey questions. If you feel that your experience would prevent you from giving the office a "very good " response, please contact our office to try to remedy the situation. We may be reached at [phone]. Thank you for taking the time out of your busy day to complete the survey.  Jacquelyne Balint, MD [facility]  Preventing Falls at [facility] are common, often dreaded events in the lives of older people. Aside from the obvious injuries and even death that may result, fall can cause wide-ranging consequences including loss of independence, mental decline, decreased activity and mobility. Younger people are also at risk of falling, especially those with chronic illnesses and fatigue.  Ways to reduce risk for falling Examine diet and medications. Warm foods and alcohol dilate blood vessels, which can lead to dizziness when standing. Sleep aids, antidepressants and pain medications can also increase the likelihood of a fall.  Get a vision exam. Poor vision, cataracts and glaucoma increase the chances  of falling.  Check foot gear. Shoes should fit snugly and have a sturdy, nonskid sole and a broad, low heel  Participate in a physician-approved exercise program to build and maintain muscle strength and improve balance and coordination. Programs that use ankle weights or stretch bands are excellent for muscle-strengthening. Water aerobics programs and low-impact Tai Chi programs have also been shown to improve balance and coordination.  Increase vitamin D intake. Vitamin D improves muscle strength and increases the amount of calcium the body is able to absorb and deposit in bones.  How to prevent falls from common hazards Floors - Remove all loose wires, cords, and throw rugs. Minimize clutter. Make sure rugs are anchored and smooth. Keep furniture in its usual place.  Chairs -- Use chairs with straight backs, armrests and firm seats. Add firm cushions to existing pieces to add height.  Bathroom - Install grab bars and non-skid tape in the tub or shower. Use a bathtub transfer bench or a shower chair with a back support Use an elevated toilet seat and/or safety rails to assist standing from a low surface. Do not use towel racks or bathroom tissue holders to help you stand.  Lighting - Make sure halls, stairways, and entrances are well-lit. Install a night light in your bathroom or hallway. Make sure there is a light switch at the top and bottom of the staircase. Turn lights on if you get up in the middle of the night. Make sure lamps or light switches are within reach of the bed if you have to get up during the night.  Kitchen - Install non-skid rubber mats near the sink and  stove. Clean spills immediately. Store frequently used utensils, pots, pans between waist and eye level. This helps prevent reaching and bending. Sit when getting things out of lower cupboards.  Living room/ Bedrooms - Place furniture with wide spaces in between, giving enough room to move around. Establish a route through  the living room that gives you something to hold onto as you walk.  Stairs - Make sure treads, rails, and rugs are secure. Install a rail on both sides of the stairs. If stairs are a threat, it might be helpful to arrange most of your activities on the lower level to reduce the number of times you must climb the stairs.  Entrances and doorways - Install metal handles on the walls adjacent to the doorknobs of all doors to make it more secure as you travel through the doorway.  Tips for maintaining balance Keep at least one hand free at all times. Try using a backpack or fanny pack to hold things rather than carrying them in your hands. Never carry objects in both hands when walking as this interferes with keeping your balance.  Attempt to swing both arms from front to back while walking. This might require a conscious effort if Parkinson's disease has diminished your movement. It will, however, help you to maintain balance and posture, and reduce fatigue.  Consciously lift your feet off of the ground when walking. Shuffling and dragging of the feet is a common culprit in losing your balance.  When trying to navigate turns, use a "U" technique of facing forward and making a wide turn, rather than pivoting sharply.  Try to stand with your feet shoulder-length apart. When your feet are close together for any length of time, you increase your risk of losing your balance and falling.  Do one thing at a time. Don't try to walk and accomplish another task, such as reading or looking around. The decrease in your automatic reflexes complicates motor function, so the less distraction, the better.  Do not wear rubber or gripping soled shoes, they might "catch" on the floor and cause tripping.  Move slowly when changing positions. Use deliberate, concentrated movements and, if needed, use a grab bar or walking aid. Count 15 seconds between each movement. For example, when rising from a seated position, wait  15 seconds after standing to begin walking.  If balance is a continuous problem, you might want to consider a walking aid such as a cane, walking stick, or walker. Once you've mastered walking with help, you might be ready to try it on your own again.

## 2023-01-09 NOTE — Progress Notes (Signed)
NEUROLOGY FOLLOW UP OFFICE NOTE  Natalie Frey [id number]  Subjective:  Natalie Frey is a 67 y.o. year old right-handed female with a medical history of right ACA ischemic stroke (~2020), DM2, HLD, DVT, vit D deficiency, former smoker who we last saw on 11/15/22.   To briefly review:  Patient had acute onset left sided weakness in 2020. She was eventually diagnosed with a right ACA ischemic stroke. She had virtual PT once due to COVID. She was doing okay until her hysterectomy (08/2021). After this surgery, she became much less physically active. She had a near fall in 08/2022 where she lost her balance. She started walking with a cane about 3-4 months ago.   She was having back pain and achiness about 1 year ago, so she was prescribed gabapentin. She was on gabapentin 100 mg qhs, which did not help, so she stopped it. She feels like her left lower leg is heavy. She denies tingling, burning. She has some low back pain with some radiation into left leg.   [facility] (07/[facility]/2019, Dr. Nelva BushSuarez) after her stroke. Per clinic note from 07/22/2019: Natalie Frey is a 67 y.o. right handed female with a history of T2DM, tobacco use being [facility] for follow up of recent R ACA ischemic stroke.   The patient's symptoms started on 04/26/19. She was at work that day and was bending over to pick up things on the floor when she fell over.  Since then, she has been noticing the weakness and numbness in the left upper and lower extremity, more significantly in left lower extremity.  She went to outside hospital on 04/27/19 but eventually left after waiting for 3 hours without being seen.  Her symptoms failed to improve and worsened and so [facility].  CT head revealed right frontal lobe infarct in the ACA territory. 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   NIHSS on admission was 2. PT and OT were consulted. PT recommended 5x high but patient wanted to go home with home health. ASA 81mg  and atorvastatin 80 mg were started. She was discharged home with a walker and bedside commode as well as home health and tobacco cessation resources and outpatient referral.    Most recent Assessment and Plan (11/15/22):  Her neurological examination is pertinent for subtle weakness of the left lower extremity and gait with difficulty clearing left toes when stepping through. Available diagnostic data is significant for CT head showing right frontal lobe infarct and CTA showing complete occlusion of the right ICA at level of carotid bifurcation by report (both studies from 2020, external). Patient's symptoms are most consistent with the residuals of weakness from her left frontal infarct in 2020 in the right ACA territory. A right lumbosacral radiculopathy is possible, but exam is not concerning for this currently. I think her symptoms have worsened over the last year due to deconditioning from physical inactivity since hysterectomy. I would like to start with physical therapy to help with left sided weakness, gait retraining, and help her get confidence in her walking again.   PLAN: -Continue Vit D supplementation -Physical therapy referral -Continue aspirin 81 mg daily -Continue atorvastatin 40 mg daily -Discussed gabapentin, but given that patient has minimal paresthesia that is not bothering her too badly currently, will hold off as this can contribute  to imbalance.   Since their last visit: Patient is doing PT. PT mentions widespread pain in their notes. Patient feels like this may be helping.  She mentions that she can sometimes feel like she is going to fall out of bed even when she is not near the edge.  Patient and her significant other feels like she is doing less and less and issues may be psychological.  Patient does endorse slow  movement and getting stuck at times. She denies significant tremors or acting out dreams.  MEDICATIONS:  Outpatient Encounter Medications as of 01/09/2023  Medication Sig Note   acetaminophen (TYLENOL) 325 MG tablet Take 2 tablets (650 mg total) by mouth every 6 (six) hours as needed for mild pain (or Fever >/= 101). 07/13/2020: Patient takes 2-3 tablets a week as needed   aspirin EC 81 MG tablet Take 1 tablet (81 mg total) by mouth once daily.    atorvastatin (LIPITOR) 40 MG tablet TAKE TWO TABLETS BY MOUTH ONCE EVERY DAY AT 6:00PM    glipiZIDE 2.5 MG TABS Take 2.5 mg by mouth daily with supper. 10/25/2022: Patient takes if blood sugar is >130   mirabegron ER (MYRBETRIQ) 50 MG TB24 tablet Take 1 tablet (50 mg total) by mouth once daily. 10/18/2022: Patient takes 2-3 times a week   mirtazapine (REMERON) 15 MG tablet Take 1.5 tablets (22.5 mg total) by mouth at bedtime.    Vitamin D, Ergocalciferol, (DRISDOL) 1.25 MG (50000 UNIT) CAPS capsule Take 1 capsule (50,000 Units total) by mouth every 7 (seven) days.    No facility-administered encounter medications on file as of 01/09/2023.    PAST MEDICAL HISTORY: Past Medical History:  Diagnosis Date   Cancer    bladder (10 years ago, per patient)   Dvt femoral (deep venous thrombosis)    Hyperlipidemia    Paresthesia    Prediabetes    Stroke    July 2020, per patient   Type 2 diabetes mellitus without complications 03/17/2019    PAST SURGICAL HISTORY: Past Surgical History:  Procedure Laterality Date   ABDOMINAL HYSTERECTOMY  08/2021   BLADDER SURGERY     COLONOSCOPY WITH PROPOFOL N/A 02/11/2020   Procedure: COLONOSCOPY WITH PROPOFOL;  Surgeon: Wyline Mood, MD;  Location: [facility] ENDOSCOPY;  Service: Gastroenterology;  Laterality: N/A;   DILATION AND EVACUATION      ALLERGIES: Allergies  Allergen Reactions   Metformin And Related Diarrhea    Severe diarrhea    FAMILY HISTORY: Family History  Problem Relation Age of Onset    Diabetes Mother    Heart disease Mother    Stroke Mother    Mesothelioma Father    Hypertension Sister    Pulmonary embolism Sister        while in hospital; other sister- on BCPs.    Breast cancer Sister 88   Hypertension Sister    Hyperlipidemia Sister    Cancer Sister    Aneurysm Brother        brain   Stroke Brother    Other Brother        unknown medical history   Deep vein thrombosis Daughter         when pregnant     SOCIAL HISTORY: Social History   Tobacco Use   Smoking status: Former    Packs/day: 0.25    Years: 47.00    Additional pack years: 0.00    Total pack years: 11.75    Types: Cigarettes    Quit date: 01/2022  Years since quitting: 0.9   Smokeless tobacco: Never   Tobacco comments:    1 pack in 4 days; was able to quit for awhile. Patient stills has an occasional cigarettes, but  uses the Nicoderm patches prn Patient has now quit smoking  Vaping Use   Vaping Use: Never used  Substance Use Topics   Alcohol use: Not Currently    Comment: last use 11/2020   Drug use: Not Currently    Types: Marijuana, Cocaine    Comment: last use 11/14/20 (marijuana)   Social History   Social History Narrative   [place]; smoking; 1ppd/cutting down. Social. Worked for [employer] from [place].       Said she has been doing ok and does not need any assistance          - is currently on food stamps but those have been enough       .[facility] consent form signed    Are you right handed or left handed? RIGHT   Are you currently employed ?    What is your current occupation?RETIRED   Do you live at home alone?   Who lives with you? boyfriend   What type of home do you live in: 1 story or 2 story? one    Caffeine 1-2 cup in AM      Objective:  Vital Signs:  BP (!) 160/82   Pulse 74   Resp 20   Ht 5\' 4"  (1.626 m)   Wt 206 lb (93.4 kg)   SpO2 98%   BMI 35.36 kg/m   General: General appearance: Awake and alert. No distress. Cooperative with exam.   Skin: No obvious rash or jaundice. HEENT: Atraumatic. Anicteric. Lungs: Non-labored breathing on room air  Heart: Regular Extremities: No edema. Psych: Affect appropriate.  Neurological: Mental Status: Alert. Speech fluent. No pseudobulbar affect Cranial Nerves: CNII: No RAPD. Visual fields intact. CNIII, IV, VI: PERRL. No nystagmus. EOMI. CN V: Facial sensation intact bilaterally to fine touch CN VII: Facial muscles symmetric and strong. No ptosis at rest. CN VIII: Hears finger rub well bilaterally. CN IX: No hypophonia. CN X: Palate elevates symmetrically. CN XI: Full strength shoulder shrug bilaterally. CN XII: Tongue protrusion full and midline. No atrophy or fasciculations. No significant dysarthria Motor: Tone is mildly increased in left upper and lower extremities.  Individual muscle group testing (MRC grade out of 5):  Movement     Neck flexion 5    Neck extension 5     Right Left   Shoulder abduction 5 5   Elbow flexion 5 5   Elbow extension 5 5   Finger abduction - FDI 5 5   Finger abduction - ADM 5 5   Finger extension 5 5   Finger distal flexion - 2/3 5 5    Finger distal flexion - 4/5 5 5    Thumb flexion - FPL 5 5   Thumb abduction - APB 5 5    Hip flexion 5 4+   Hip extension 5 5   Hip adduction 5 5   Hip abduction 5 5   Knee extension 5 5   Knee flexion 5 5   Dorsiflexion 5 5-   Plantarflexion 5 5    Reflexes:  Right Left   Bicep 2+ 2+   Tricep 2+ 2+   BrRad 2+ 2+   Knee 2+ 2+   Ankle 2+ 2+    Sensation: Pinprick: Intact in all extremities Coordination: Intact finger-to- nose-finger  bilaterally. Difficulty with RAM in LUE and LLE.  Romberg negative. Gait: Narrow based gait with cane. Slow, cautious steps. No clear freezing. En bloc turns   Labs and Imaging review: No new results   Previously reviewed results:      Lab Results  Component Value Date    HGBA1C 6.6 (H) 10/18/2022      Recent Labs           Lab Results  Component  Value Date    VITAMINB12 730 10/18/2022      Recent Labs           Lab Results  Component Value Date    TSH 1.840 04/17/2018      Recent Labs[] Expand by Default  No results found for: "ESRSEDRATE", "POCTSEDRATE"      Vit D (10/18/22): 6.1   Component     Latest Ref Rng 10/18/2022  Cholesterol, Total     100 - 199 mg/dL 400   Triglycerides     0 - 149 mg/dL 867   HDL Cholesterol     >39 mg/dL 40   VLDL Cholesterol Cal     5 - 40 mg/dL 21   LDL Chol Calc (NIH)     0 - 99 mg/dL 66   Total CHOL/HDL Ratio     0.0 - 4.4 ratio 3.2     Hypercoag work up for DVT - 04/09/2019: Component     Latest Ref Rng 04/09/2019  Beta-2 Glycoprotein I Ab, IgG     0 - 20 GPI IgG units <9   Beta-2-Glycoprotein I IgM     0 - 32 GPI IgM units <9   Beta-2-Glycoprotein I IgA     0 - 25 GPI IgA units <9   Anticardiolipin Ab,IgG,Qn     0 - 14 GPL U/mL 9   Anticardiolipin Ab,IgM,Qn     0 - 12 MPL U/mL <9   Anticardiolipin Ab,IgA,Qn     0 - 11 APL U/mL <9   Fibrin derivatives D-dimer ([facility])     0.00 - 499.00 ng/mL (FEU) 716.54 (H)   Protein S, Total     60 - 150 % 98   Protein C, Total     60 - 150 % 119   Antithrombin Activity     75 - 120 % 114   Recommendations-F5LEID: Comment   Recommendations-PTGENE: Comment       External labs: Labs:  LDL 100.1  Hemoglobin A1c 6.6 (03/11/19)  CTA Neck (04/29/19): 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 0.5 cm right upper lobe groundglass nodule. Attention on follow-up.    Imaging: CTH wo contrast (external, 04/29/2019): FINDINGS:  There is no midline shift. Right frontal hypodense region, involving anterior superior and middle frontal gyri, is concerning for acute infarct. No acute intracranial hemorrhage. No fractures are evident. The sinuses are pneumatized.   IMPRESSION:  Right frontal lobe acute infarct, ACA territory.    CTA head and neck (external, 04/29/2019): FINDINGS:  There is complete occlusion  of the right ICA at the level of the carotid bifurcation  (6:83). No evidence of dissection or aneurysm in the carotid or vertebral arteries.   The soft tissues are unremarkable without lymphadenopathy, mass or abscess. The visualized osseous structures are unremarkable. The airway is patent and midline. 0.5 cm groundglass nodule within the right upper lobe (6:149).   In this report, if stenosis of the internal carotid artery is reported, it is calculated based  on the NASCET method: (1 - (minimal residual diameter/normal diameter of distal ICA)).   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   -0.5 cm right upper lobe groundglass nodule. Attention on follow-up.  Assessment/Plan:  [name], a 67 y.o. female with left leg weakness after right ACA territory in 2020. She has some increased tone and abnormal rapid alternating movements on the left that could be due to stroke, but with reported freezing and possible bradykinesia, parkinsonism is another consideration. I will see how patient does with PT and re-evaluate.  Plan: -Continue PT, including home exercises -Continue aspirin 81 mg daily -Continue atorvastatin 40 mg daily -Continue vit D supplementation -OTC recommendations for cramps given -May consider sinemet trial if PT does not make significant difference  Return to clinic as planned in 05/2023  Total time spent reviewing records, interview, history/exam, documentation, and coordination of care on day of encounter:  40 min  Jacquelyne Balint, MD

## 2023-01-10 ENCOUNTER — Ambulatory Visit: Payer: Medicaid Other

## 2023-01-10 ENCOUNTER — Telehealth: Payer: Self-pay

## 2023-01-10 ENCOUNTER — Other Ambulatory Visit: Payer: Self-pay

## 2023-01-10 DIAGNOSIS — R2689 Other abnormalities of gait and mobility: Secondary | ICD-10-CM

## 2023-01-10 DIAGNOSIS — R269 Unspecified abnormalities of gait and mobility: Secondary | ICD-10-CM

## 2023-01-10 DIAGNOSIS — R29898 Other symptoms and signs involving the musculoskeletal system: Secondary | ICD-10-CM

## 2023-01-10 DIAGNOSIS — R202 Paresthesia of skin: Secondary | ICD-10-CM

## 2023-01-10 NOTE — Telephone Encounter (Signed)
Patient called in asking for another referral to continue PT, states she has benefited from it and the PT recommended a few more weeks. Patient is currently going, [facility].

## 2023-01-11 ENCOUNTER — Other Ambulatory Visit: Payer: Self-pay | Admitting: Gerontology

## 2023-01-11 ENCOUNTER — Other Ambulatory Visit: Payer: Self-pay

## 2023-01-11 DIAGNOSIS — R4589 Other symptoms and signs involving emotional state: Secondary | ICD-10-CM

## 2023-01-15 ENCOUNTER — Other Ambulatory Visit: Payer: Self-pay

## 2023-01-15 MED ORDER — MIRTAZAPINE 15 MG PO TABS
22.5000 mg | ORAL_TABLET | Freq: Every evening | ORAL | 0 refills | Status: AC
Start: 2023-01-15 — End: ?
  Filled 2023-01-15: qty 45, 30d supply, fill #0

## 2023-01-15 NOTE — Telephone Encounter (Signed)
No longer a patient at [facility] (active insurance). Patient needs to request from new PCP.

## 2023-01-15 NOTE — Telephone Encounter (Signed)
Patient called today requesting refill on Mirtazapine . Spoke with Lanora Manis, she ok'd 1 month supply only. Patient has active health insurance and is no longer a patient at [facility]. Patient has OV with new PCP on 01/21/23. Advised patient this will be the last refill that we will authorize. Patient will need to address this with her new PCP. Patient agreed and voiced understanding.

## 2023-01-17 NOTE — Therapy (Signed)
OUTPATIENT PHYSICAL THERAPY NEURO TREATMENT  Patient Name: Natalie Frey MRN: [id number] DOB:[birth year], 67 y.o., female Today's Date: 01/18/2023  END OF SESSION:  PT End of Session - 01/18/23 0917     PT Start Time 0900    PT Stop Time 0915    PT Time Calculation (min) 15 min         Past Medical History:  Diagnosis Date   Cancer    bladder (10 years ago, per patient)   Dvt femoral (deep venous thrombosis)    Hyperlipidemia    Paresthesia    Prediabetes    Stroke    July 2020, per patient   Type 2 diabetes mellitus without complications 03/17/2019   Past Surgical History:  Procedure Laterality Date   ABDOMINAL HYSTERECTOMY  08/2021   BLADDER SURGERY     COLONOSCOPY WITH PROPOFOL N/A 02/11/2020   Procedure: COLONOSCOPY WITH PROPOFOL;  Surgeon: Wyline Mood, MD;  Location: [facility] ENDOSCOPY;  Service: Gastroenterology;  Laterality: N/A;   DILATION AND EVACUATION     Patient Active Problem List   Diagnosis Date Noted   Paresthesia of left leg 04/25/2021   Skin mole 04/25/2021   History of DVT of lower extremity 02/28/2021   Intermittent claudication 02/23/2021   Ganglion cyst of wrist 02/23/2021   Prediabetes 12/08/2019   Health care maintenance 10/05/2019   Urinary leakage 09/02/2019   Elevated blood pressure reading 05/06/2019   Ischemic stroke diagnosed during current admission 05/05/2019   Anxiety about health 05/05/2019   Paresthesia of left arm and leg 04/29/2019   Chronic deep vein thrombosis (DVT) of femoral vein of left lower extremity 03/26/2019   Leg swelling 03/17/2019   Abnormal laboratory test 03/17/2019   Type 2 diabetes mellitus without complications 03/17/2019   Smoking 01/18/2018   DVT (deep venous thrombosis) 12/23/2017   PCP: None reported  REFERRING PROVIDER: Dr. Jacquelyne Balint, MD  ONSET DATE: 2020/chronic  REFERRING DIAG: L leg weakness, imbalance, gait abnormality  THERAPY DIAG:  Unsteadiness on feet  Muscle weakness  (generalized)  Rationale for Evaluation and Treatment: Rehabilitation  FROM INITIAL EVALUATION SUBJECTIVE:                                                                                                                                                                                             SUBJECTIVE STATEMENT: Pt states she is having difficulty with her balance; especially while walking, during transfers from sit to stand, and also getting out of bed.  She has significantly limited her activity because of her balance.  She is fearful of falling.  She is having difficulty with navigating  uneven surfaces (yard) to get out to her greenhouse area on her property.  She started using a SPC a few months (since November 2023) because of a near fall episode.  She had hysterectomy in Dec 2022- started noticing decrease in activity around then and since becoming more sedentary her confidence in her balance has declined.  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  Navigating to step over a "gap" (ie: getting onto/off a train to visit her siblings) is scary.  She emphasizes her fear and lack of confidence with her balance multiple times during history taking.  F/u with neurology in 6 months. Pt accompanied by: significant other, his name is "Rich", he drives her to PT  PERTINENT HISTORY: Pt has h/o CVA (R ACA ischemic stroke) 2020;  hysterectomy Dec 2022  PAIN: Are you having pain? No "uncomfortable legs"  PRECAUTIONS: Fall  WEIGHT BEARING RESTRICTIONS: No  FALLS: Has patient fallen in last 6 months? Yes. Number of falls 1 "near fall"  LIVING ENVIRONMENT: Lives with: lives with their family and lives with an adult companion Lives in: House/apartment Stairs: yes,3 to enter home, b/l railings on 1 set and no railing on 1 set Has following equipment at home: Single point cane  PLOF: Independent  PATIENT GOALS: to improve her balance, improve strength, to  improve confidence with balance and strength again.  She would like to be able to walk on uneven surfaces in yard/at the beach, and go to a concert in [place]  OBJECTIVE:   COGNITION:Overall cognitive status: Within functional limits for tasks assessed   SENSATION: Not tested  COORDINATION: Deferred  EDEMA: Deferred  POSTURE: rounded shoulders  LOWER EXTREMITY ROM: Deferred  LOWER EXTREMITY MMT:    MMT Right Eval Left Eval  Hip flexion 5 4  Hip extension    Hip abduction    Hip adduction    Hip internal rotation    Hip external rotation    Knee flexion 5 4  Knee extension 5 4  Ankle dorsiflexion 5 4  Ankle plantarflexion 5 4  Ankle inversion 5 4  Ankle eversion 5 4  (Blank rows = not tested)  BED MOBILITY:  Supine to sit SBA Rolling to Left SBA  TRANSFERS: Assistive device utilized: Single point cane  Sit to stand: Complete Independence Stand to sit: Complete Independence Chair to chair: Modified independence- uses hands for support Floor:  not assessed today- deferred   STAIRS: Deferred to visit #2  GAIT: Gait pattern: decreased step length L and R, decreased knee flexion L, small steps while turning 180 deg Distance walked: flat surfaces in clinic while holding cane and significant other's hand for "confidence" Assistive device utilized: Single point cane (SPC is a little long for her height) Level of assistance: Modified independence holds PT or significant other's hand for "confidence"   FUNCTIONAL TESTS:  OUTCOME MEASURES: TEST Outcome Interpretation  5 times sit<>stand 22 seconds >60 yo, >15 sec indicates increased risk for falls  10 meter walk test .71 m/s (14 sec) <1.0 m/s indicates increased risk for falls; limited community ambulator  Nurse, learning disability Deferred <36/56 (100% risk for falls), 37-45 (80% risk for falls); 46-51 (>50% risk for falls); 52-55 (lower risk <25% of falls)  FOTO 49; 55 predicted visit 19 Predicted discharge score of  %       TREATMENT   SUBJECTIVE:  Pt reports that she has been having a lot of L knee pain  and swelling since yesterday. She denies any falls or trauma to L knee. Denies prior history of LLE swelling. Pt experiences generalized chronic widespread pain;   Wells' Criteria for DVT:  Score Active cancer (patient either receiving treatment for cancer within the previous 6 months or currently receiving palliative treatment): 0  Paralysis, paresis, or recent cast immobilization of the lower extremities: 0  Recently bedridden for???3 days, or major surgery within the previous 12 weeks requiring general or regional anesthesia: 0  Localized tenderness along the distribution of the deep venous system: 1  Entire leg swelling: 1  Calf swelling at least 3 cm larger than that on the asymptomatic side (measured 10 cm below tibial tuberosity): 0, LLE: 36cm, RLE: 34.5cm  Pitting edema confined to the symptomatic leg: 0  Collateral superficial veins (non-varicose): 0  Previously documented deep vein thrombosis: 1 (4 years ago);  Alternative diagnosis at least as likely as deep vein thrombosis: 0  Total: 3 points (High risk group for DVT)  Unable to palpation dorsalis pedis or posterior tibialis pulse bilaterally;   ASSESSMENT:  CLINICAL IMPRESSION: Pt arrives reporting she has been having a lot of L knee pain and swelling since yesterday. She denies any falls or trauma to L knee. Denies prior history of LLE swelling. Pt has a history of LLE DVT 4 years ago per subjective report. History of CVA. Entire LLE is mildly swollen, red, and warm. Calf is tight and painful to compression. Pt takes a baby ASA daily but otherwise is not on a blood thinner. She denies any DOE or SOB today but gait is painful.  Denies any focal weakness or N/T today. Wells' Criteria for DVT score is 3 today placing pt at high risk for possible DVT. Pt advised to present to the ER in order to rule out DVT as possible source.  Failure to seek immediate medical attention could result in severe injury and/or death. Pt declines to go to the ER either via personal transport or EMS. Pt leaves clinic with her boyfriend.      OBJECTIVE IMPAIRMENTS: Abnormal gait, decreased activity tolerance, decreased balance, difficulty walking, and decreased strength.   ACTIVITY LIMITATIONS: lifting, standing, stairs, transfers, bed mobility, and locomotion level  PARTICIPATION LIMITATIONS: cleaning, interpersonal relationship, driving, community activity, occupation, and yard work  PERSONAL FACTORS: Past/current experiences, Time since onset of injury/illness/exacerbation, and 1-2 comorbidities: h/o CVA in 2020  are also affecting patient's functional outcome.   REHAB POTENTIAL: Good  CLINICAL DECISION MAKING: Stable/uncomplicated  EVALUATION COMPLEXITY: Low  PLAN:  PT FREQUENCY: 2x/week  PT DURATION: 8 weeks  PLANNED INTERVENTIONS: Therapeutic exercises, Therapeutic activity, Neuromuscular re-education, Balance training, Gait training, Patient/Family education, Self Care, and Joint mobilization  PLAN FOR NEXT SESSION: Update outcome measures/goals, progress balance/LE strengthening exercises, review/modify HEP as needed;  Sharalyn Ink Jnae Thomaston PT, DPT, GCS  Natalie Frey, PT 01/18/2023, 9:23 AM

## 2023-01-18 ENCOUNTER — Ambulatory Visit: Payer: Medicaid Other

## 2023-01-18 DIAGNOSIS — R2681 Unsteadiness on feet: Secondary | ICD-10-CM

## 2023-01-18 DIAGNOSIS — M6281 Muscle weakness (generalized): Secondary | ICD-10-CM

## 2023-01-24 ENCOUNTER — Ambulatory Visit: Payer: Medicaid Other

## 2023-01-30 ENCOUNTER — Other Ambulatory Visit: Payer: Self-pay

## 2023-01-31 ENCOUNTER — Ambulatory Visit: Payer: Medicaid Other

## 2023-02-01 ENCOUNTER — Other Ambulatory Visit: Payer: Self-pay

## 2023-02-01 DIAGNOSIS — F419 Anxiety disorder, unspecified: Secondary | ICD-10-CM | POA: Diagnosis not present

## 2023-02-01 DIAGNOSIS — Z1329 Encounter for screening for other suspected endocrine disorder: Secondary | ICD-10-CM | POA: Diagnosis not present

## 2023-02-01 DIAGNOSIS — Z1321 Encounter for screening for nutritional disorder: Secondary | ICD-10-CM | POA: Diagnosis not present

## 2023-02-01 DIAGNOSIS — N393 Stress incontinence (female) (male): Secondary | ICD-10-CM | POA: Diagnosis not present

## 2023-02-01 DIAGNOSIS — Z87448 Personal history of other diseases of urinary system: Secondary | ICD-10-CM | POA: Diagnosis not present

## 2023-02-01 DIAGNOSIS — Z1159 Encounter for screening for other viral diseases: Secondary | ICD-10-CM | POA: Diagnosis not present

## 2023-02-01 DIAGNOSIS — E119 Type 2 diabetes mellitus without complications: Secondary | ICD-10-CM | POA: Diagnosis not present

## 2023-02-01 DIAGNOSIS — Z8673 Personal history of transient ischemic attack (TIA), and cerebral infarction without residual deficits: Secondary | ICD-10-CM | POA: Diagnosis not present

## 2023-02-01 DIAGNOSIS — Z130 Encounter for screening for diseases of the blood and blood-forming organs and certain disorders involving the immune mechanism: Secondary | ICD-10-CM | POA: Diagnosis not present

## 2023-02-01 DIAGNOSIS — E785 Hyperlipidemia, unspecified: Secondary | ICD-10-CM | POA: Diagnosis not present

## 2023-02-01 DIAGNOSIS — G4700 Insomnia, unspecified: Secondary | ICD-10-CM | POA: Diagnosis not present

## 2023-02-01 DIAGNOSIS — E559 Vitamin D deficiency, unspecified: Secondary | ICD-10-CM | POA: Diagnosis not present

## 2023-02-01 DIAGNOSIS — N3281 Overactive bladder: Secondary | ICD-10-CM | POA: Diagnosis not present

## 2023-02-01 DIAGNOSIS — R451 Restlessness and agitation: Secondary | ICD-10-CM | POA: Diagnosis not present

## 2023-02-01 DIAGNOSIS — Z13220 Encounter for screening for lipoid disorders: Secondary | ICD-10-CM | POA: Diagnosis not present

## 2023-02-01 DIAGNOSIS — N8110 Cystocele, unspecified: Secondary | ICD-10-CM | POA: Diagnosis not present

## 2023-02-01 DIAGNOSIS — Z0000 Encounter for general adult medical examination without abnormal findings: Secondary | ICD-10-CM | POA: Diagnosis not present

## 2023-02-06 ENCOUNTER — Ambulatory Visit: Payer: Medicaid Other | Attending: Neurology

## 2023-02-06 DIAGNOSIS — M6281 Muscle weakness (generalized): Secondary | ICD-10-CM | POA: Insufficient documentation

## 2023-02-06 DIAGNOSIS — R2681 Unsteadiness on feet: Secondary | ICD-10-CM

## 2023-02-06 NOTE — Therapy (Unsigned)
OUTPATIENT PHYSICAL THERAPY NEURO TREATMENT/RECERTIFICATION   Patient Name: Natalie Frey MRN: [id number] DOB:09-17-1956, 67 y.o., female Today's Date: 02/07/2023  END OF SESSION:  PT End of Session - 02/06/23 0812     Visit Number 10    Number of Visits 33    Date for PT Re-Evaluation 04/03/23    Authorization Type eval: 11/29/22, recertification 02/06/23;    PT Start Time 0809    PT Stop Time 0847    PT Time Calculation (min) 38 min    Equipment Utilized During Treatment Gait belt    Activity Tolerance Patient tolerated treatment well    Behavior During Therapy [facility] for tasks assessed/performed            Past Medical History:  Diagnosis Date   Cancer (HCC)    bladder (10 years ago, per patient)   Dvt femoral (deep venous thrombosis) (HCC)    Hyperlipidemia    Paresthesia    Prediabetes    Stroke [facility])    July 2020, per patient   Type 2 diabetes mellitus without complications (HCC) 03/17/2019   Past Surgical History:  Procedure Laterality Date   ABDOMINAL HYSTERECTOMY  08/2021   BLADDER SURGERY     COLONOSCOPY WITH PROPOFOL N/A 02/11/2020   Procedure: COLONOSCOPY WITH PROPOFOL;  Surgeon: Wyline Mood, MD;  Location: [facility] ENDOSCOPY;  Service: Gastroenterology;  Laterality: N/A;   DILATION AND EVACUATION     Patient Active Problem List   Diagnosis Date Noted   Paresthesia of left leg 04/25/2021   Skin mole 04/25/2021   History of DVT of lower extremity 02/28/2021   Intermittent claudication (HCC) 02/23/2021   Ganglion cyst of wrist 02/23/2021   Prediabetes 12/08/2019   Health care maintenance 10/05/2019   Urinary leakage 09/02/2019   Elevated blood pressure reading 05/06/2019   Ischemic stroke diagnosed during current admission (HCC) 05/05/2019   Anxiety about health 05/05/2019   Paresthesia of left arm and leg 04/29/2019   Chronic deep vein thrombosis (DVT) of femoral vein of left lower extremity (HCC) 03/26/2019   Leg swelling 03/17/2019   Abnormal laboratory  test 03/17/2019   Type 2 diabetes mellitus without complications (HCC) 03/17/2019   Smoking 01/18/2018   DVT (deep venous thrombosis) (HCC) 12/23/2017   PCP: None reported  REFERRING PROVIDER: Dr. Jacquelyne Balint, MD  ONSET DATE: 2020/chronic  REFERRING DIAG: L leg weakness, imbalance, gait abnormality  THERAPY DIAG:  Unsteadiness on feet  Rationale for Evaluation and Treatment: Rehabilitation  FROM INITIAL EVALUATION SUBJECTIVE:  SUBJECTIVE STATEMENT: Pt states she is having difficulty with her balance; especially while walking, during transfers from sit to stand, and also getting out of bed.  She has significantly limited her activity because of her balance.  She is fearful of falling.  She is having difficulty with navigating uneven surfaces (yard) to get out to her greenhouse area on her property.  She started using a SPC a few months (since November 2023) because of a near fall episode.  She had hysterectomy in Dec 2022- started noticing decrease in activity around then and since becoming more sedentary her confidence in her balance has declined.  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  Navigating to step over a "gap" (ie: getting onto/off a train to visit her siblings) is scary.  She emphasizes her fear and lack of confidence with her balance multiple times during history taking.  F/u with neurology in 6 months. Pt accompanied by: significant other, his name is "Rich", he drives her to PT  PERTINENT HISTORY: Pt has h/o CVA (R ACA ischemic stroke) 2020;  hysterectomy Dec 2022  PAIN: Are you having pain? No "uncomfortable legs"  PRECAUTIONS: Fall  WEIGHT BEARING RESTRICTIONS: No  FALLS: Has patient fallen in last 6 months? Yes. Number of falls 1 "near  fall"  LIVING ENVIRONMENT: Lives with: lives with their family and lives with an adult companion Lives in: House/apartment Stairs: yes,3 to enter home, b/l railings on 1 set and no railing on 1 set Has following equipment at home: Single point cane  PLOF: Independent  PATIENT GOALS: to improve her balance, improve strength, to improve confidence with balance and strength again.  She would like to be able to walk on uneven surfaces in yard/at the beach, and go to a concert in [place]  OBJECTIVE:   COGNITION:Overall cognitive status: Within functional limits for tasks assessed   SENSATION: Not tested  COORDINATION: Deferred  EDEMA: Deferred  POSTURE: rounded shoulders  LOWER EXTREMITY ROM: Deferred  LOWER EXTREMITY MMT:    MMT Right Eval Left Eval  Hip flexion 5 4  Hip extension    Hip abduction    Hip adduction    Hip internal rotation    Hip external rotation    Knee flexion 5 4  Knee extension 5 4  Ankle dorsiflexion 5 4  Ankle plantarflexion 5 4  Ankle inversion 5 4  Ankle eversion 5 4  (Blank rows = not tested)  BED MOBILITY:  Supine to sit SBA Rolling to Left SBA  TRANSFERS: Assistive device utilized: Single point cane  Sit to stand: Complete Independence Stand to sit: Complete Independence Chair to chair: Modified independence- uses hands for support Floor:  not assessed today- deferred   STAIRS: Deferred to visit #2  GAIT: Gait pattern: decreased step length L and R, decreased knee flexion L, small steps while turning 180 deg Distance walked: flat surfaces in clinic while holding cane and significant other's hand for "confidence" Assistive device utilized: Single point cane (SPC is a little long for her height) Level of assistance: Modified independence holds PT or significant other's hand for "confidence"   FUNCTIONAL TESTS:  OUTCOME MEASURES: TEST Outcome Interpretation  5 times sit<>stand 22 seconds >60 yo, >15 sec indicates increased risk  for falls  10 meter walk test .71 m/s (14 sec) <1.0 m/s indicates increased risk for falls; limited community ambulator  Nurse, learning disability Deferred <36/56 (100% risk for falls), 37-45 (80%  risk for falls); 46-51 (>50% risk for falls); 52-55 (lower risk <25% of falls)  FOTO 49; 55 predicted visit 19 Predicted discharge score of %       TREATMENT   SUBJECTIVE:  Pt reports that she is very tired today and feels unsteady. The other day she fell off her bed and was trapped between the bed and the wall for an hour before someone found her and was able to help her get up. No specific questions today.    PAIN: Generalized chronic widespread pain;   Ther-ex  NuStep L1-2 x 6 minutes for warm-up, LE strengthening, during interval history (2 minutes unbilled);  Updated outcome measures with patient (see goal section):  LOWER EXTREMITY MMT:    MMT Right Eval Left Eval  Hip flexion 4+ 4  Hip extension    Hip abduction (seated) 4+ 4  Hip adduction (seated) 4+ 4  Hip internal rotation 4+ 4  Hip external rotation 4+ 4  Knee flexion 4+ 4  Knee extension 5 4  Ankle dorsiflexion 4+ 4  Ankle plantarflexion    Ankle inversion    Ankle eversion    (Blank rows = not tested)    PATIENT EDUCATION: Education details: Outcome measures and goals; Person educated: Patient Education method: Programmer, multimedia, Demonstration, and Verbal cues Education comprehension: verbalized understanding, returned demonstration, and verbal cues required   HOME EXERCISE PROGRAM: Access Code: [email or web address] URL: [email or web address] Date: 12/04/2022 Prepared by: Ria Comment  Exercises - Romberg Stance  - 1 x daily - 7 x weekly - 3 reps - 30s hold - Romberg Stance with Head Rotation  - 1 x daily - 7 x weekly - 3 reps - 30s hold - Standing March with Counter Support  - 1 x daily - 7 x weekly - 2 sets - 10 reps - 3s hold   GOALS: Goals reviewed with patient? Yes  SHORT TERM GOALS: Target date:  03/06/2023   Pt will demonstrate ability to transfer supine to sidelying to sitting independently Baseline: CGA/SBA during initial evaluation today, pt lacks confidence too Goal status: IN PROGRESS    LONG TERM GOALS: Target date: 04/03/2023   Improve L LE strength 1/2 MMT to facilitate improved ability to ambulate on even and uneven surfaces x 15 minutes consecutively at home and in her yard Baseline: 4/5; 02/06/23: 4/5 throughout LLE Goal status: IN PROGRESS  2.  Improve FOTO to >55 indicating an improved ability to perform her activities at [facility] Baseline: 49; 02/06/23: 53 Goal status: IN PROGRESS  3.  Pt will be able to perform 5x STS in <15 seconds indicating reduced fall risk Baseline: 22 sec; 02/06/23: 16.6s Goal status: IN PROGRESS  4.  Pt will improve BERG by at least 3 points in order to demonstrate clinically significant improvement in balance.   Baseline: 38/56; 02/06/23: 36/56 Goal status: IN PROGRESS  4.  Pt will improve ABC by at least 13% in order to demonstrate clinically significant improvement in balance confidence.    Baseline: 01/03/23: 45.6%; 02/06/23: 46.9% Goal status: IN PROGRESS   ASSESSMENT:  CLINICAL IMPRESSION: Updated outcome measures with patient during visit today. She demonstrates an improvement in her 5TSTS and FOTO scores. Her LLE strength remains grossly 4/5 throughout. Her BERG and ABC are essentially unchanged. Physical therapy remains limited due to fatigue. No HEP updates at this time. Plan to progress both balance and strength exercises during future sessions.  Pt will benefit from PT services to address deficits in strength, balance, and mobility in  order to return to full function at home and decrease her risk for falls.      OBJECTIVE IMPAIRMENTS: Abnormal gait, decreased activity tolerance, decreased balance, difficulty walking, and decreased strength.   ACTIVITY LIMITATIONS: lifting, standing, stairs, transfers, bed mobility, and locomotion  level  PARTICIPATION LIMITATIONS: cleaning, interpersonal relationship, driving, community activity, occupation, and yard work  PERSONAL FACTORS: Past/current experiences, Time since onset of injury/illness/exacerbation, and 1-2 comorbidities: h/o CVA in 2020  are also affecting patient's functional outcome.   REHAB POTENTIAL: Good  CLINICAL DECISION MAKING: Stable/uncomplicated  EVALUATION COMPLEXITY: Low  PLAN:  PT FREQUENCY: 2x/week  PT DURATION: 8 weeks  PLANNED INTERVENTIONS: Therapeutic exercises, Therapeutic activity, Neuromuscular re-education, Balance training, Gait training, Patient/Family education, Self Care, and Joint mobilization  PLAN FOR NEXT SESSION: progress balance/LE strengthening exercises, review/modify HEP as needed;  Sharalyn Ink Tahmid Stonehocker PT, DPT, GCS  Eyoel Throgmorton, PT 02/07/2023, 1:10 PM

## 2023-02-14 ENCOUNTER — Ambulatory Visit: Payer: Medicaid Other

## 2023-02-21 ENCOUNTER — Ambulatory Visit: Payer: Medicaid Other

## 2023-02-21 DIAGNOSIS — M6281 Muscle weakness (generalized): Secondary | ICD-10-CM

## 2023-02-21 DIAGNOSIS — R2681 Unsteadiness on feet: Secondary | ICD-10-CM

## 2023-03-01 DIAGNOSIS — Z789 Other specified health status: Secondary | ICD-10-CM | POA: Diagnosis not present

## 2023-03-01 DIAGNOSIS — E876 Hypokalemia: Secondary | ICD-10-CM | POA: Diagnosis not present

## 2023-03-01 DIAGNOSIS — Z8673 Personal history of transient ischemic attack (TIA), and cerebral infarction without residual deficits: Secondary | ICD-10-CM | POA: Diagnosis not present

## 2023-03-01 DIAGNOSIS — E1169 Type 2 diabetes mellitus with other specified complication: Secondary | ICD-10-CM | POA: Diagnosis not present

## 2023-03-01 DIAGNOSIS — F419 Anxiety disorder, unspecified: Secondary | ICD-10-CM | POA: Diagnosis not present

## 2023-03-01 DIAGNOSIS — G4700 Insomnia, unspecified: Secondary | ICD-10-CM | POA: Diagnosis not present

## 2023-03-01 DIAGNOSIS — Z1231 Encounter for screening mammogram for malignant neoplasm of breast: Secondary | ICD-10-CM | POA: Diagnosis not present

## 2023-03-01 DIAGNOSIS — R531 Weakness: Secondary | ICD-10-CM | POA: Diagnosis not present

## 2023-03-01 DIAGNOSIS — R269 Unspecified abnormalities of gait and mobility: Secondary | ICD-10-CM | POA: Diagnosis not present

## 2023-03-07 ENCOUNTER — Ambulatory Visit: Payer: Medicaid Other | Attending: Neurology

## 2023-03-07 DIAGNOSIS — R2681 Unsteadiness on feet: Secondary | ICD-10-CM | POA: Insufficient documentation

## 2023-03-07 DIAGNOSIS — M6281 Muscle weakness (generalized): Secondary | ICD-10-CM | POA: Insufficient documentation

## 2023-03-14 ENCOUNTER — Ambulatory Visit: Payer: Medicaid Other | Attending: Neurology

## 2023-03-18 DIAGNOSIS — N3941 Urge incontinence: Secondary | ICD-10-CM | POA: Diagnosis not present

## 2023-04-09 ENCOUNTER — Ambulatory Visit: Payer: Medicaid Other | Admitting: Internal Medicine

## 2023-04-11 DIAGNOSIS — R202 Paresthesia of skin: Secondary | ICD-10-CM | POA: Diagnosis not present

## 2023-05-08 NOTE — Progress Notes (Signed)
NEUROLOGY FOLLOW UP OFFICE NOTE  Natalie Frey [id number]  Subjective:  Natalie Frey is a 67 y.o. year old right handed female with a history of right ACA ischemic stroke (~2020), DM2, HLD, DVT, vit D deficiency, former smoker who we last saw on 01/09/23.  To briefly review: Initial consultation (11/15/22): Patient had acute onset left sided weakness in 2020. She was eventually diagnosed with a right ACA ischemic stroke. She had virtual PT once due to COVID. She was doing okay until her hysterectomy (08/2021). After this surgery, she became much less physically active. She had a near fall in 08/2022 where she lost her balance. She started walking with a cane about 3-4 months ago.   She was having back pain and achiness about 1 year ago, so she was prescribed gabapentin. She was on gabapentin 100 mg qhs, which did not help, so she stopped it. She feels like her left lower leg is heavy. She denies tingling, burning. She has some low back pain with some radiation into left leg.   Patient previously saw neurology at [facility] (07/22/2019, Dr. Nelva Bush) after her stroke. Per clinic note from 07/22/2019: Natalie Frey is a 67 y.o. right handed female with a history of T2DM, tobacco use being evaluated in consultation at the [facility] for follow up of recent R ACA ischemic stroke.   The patient's symptoms started on 04/26/19. She was at work that day and was bending over to pick up things on the floor when she fell over.  Since then, she has been noticing the weakness and numbness in the left upper and lower extremity, more significantly in left lower extremity.  She went to outside hospital on 04/27/19 but eventually left after waiting for 3 hours without being seen.  Her symptoms failed to improve and worsened and so she presented to [facility] for evaluation.  CT head revealed right frontal lobe infarct in the ACA territory. CTA neck reveals complete occlusion of the right  ICA at the level of the carotid bifurcation.   NIHSS on admission was 2. PT and OT were consulted. PT recommended 5x high but patient wanted to go home with home health. ASA 81mg  and atorvastatin 80 mg were started. She was discharged home with a walker and bedside commode as well as home health and tobacco cessation resources and outpatient referral.   01/09/23: Patient is doing PT. PT mentions widespread pain in their notes. Patient feels like this may be helping.   She mentions that she can sometimes feel like she is going to fall out of bed even when she is not near the edge.   Patient and her significant other feels like she is doing less and less and issues may be psychological.   Patient does endorse slow movement and getting stuck at times. She denies significant tremors or acting out dreams.  Most recent Assessment and Plan (01/09/23): [name], a 67 y.o. female with left leg weakness after right ACA territory in 2020. She has some increased tone and abnormal rapid alternating movements on the left that could be due to stroke, but with reported freezing and possible bradykinesia, parkinsonism is another consideration. I will see how patient does with PT and re-evaluate.   Plan: -Continue PT, including home exercises -Continue aspirin 81 mg daily -Continue atorvastatin 40 mg daily -Continue vit D supplementation -OTC recommendations for cramps given -May consider sinemet trial if PT does not make  significant difference  Since their last visit: Patient thinks PT did not help much but this is because she was not able to schedule the appointments. She has not been doing much at home and not doing the exercises. Per patient, she is lazy and in the building. 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 She does not trust herself to move. She can still freeze, but if her significant other reminds her to step by saying "right, left, right, left" she has no issues.  She has not  had any falls while walking. She has slid out of a chair to the ground when she has on socks and slides on the floor about 3 times since last visit.  MEDICATIONS:  Outpatient Encounter Medications as of 05/16/2023  Medication Sig Note   acetaminophen (TYLENOL) 325 MG tablet Take 2 tablets (650 mg total) by mouth every 6 (six) hours as needed for mild pain (or Fever >/= 101). 07/13/2020: Patient takes 2-3 tablets a week as needed   aspirin EC 81 MG tablet Take 1 tablet (81 mg total) by mouth once daily.    atorvastatin (LIPITOR) 40 MG tablet TAKE TWO TABLETS BY MOUTH ONCE EVERY DAY AT 6:00PM (Patient taking differently: Take 40 mg by mouth daily.)    glipiZIDE 2.5 MG TABS Take 2.5 mg by mouth daily with supper. 10/25/2022: Patient takes if blood sugar is >130   mirabegron ER (MYRBETRIQ) 50 MG TB24 tablet Take 1 tablet (50 mg total) by mouth once daily. 10/18/2022: Patient takes 2-3 times a week   mirtazapine (REMERON) 15 MG tablet Take 1.5 tablets (22.5 mg total) by mouth at bedtime.    Vitamin D, Ergocalciferol, (DRISDOL) 1.25 MG (50000 UNIT) CAPS capsule Take 1 capsule (50,000 Units total) by mouth every 7 (seven) days.    No facility-administered encounter medications on file as of 05/16/2023.    PAST MEDICAL HISTORY: Past Medical History:  Diagnosis Date   Cancer (HCC)    bladder (10 years ago, per patient)   Dvt femoral (deep venous thrombosis) (HCC)    Hyperlipidemia    Paresthesia    Prediabetes    Stroke [facility])    July 2020, per patient   Type 2 diabetes mellitus without complications (HCC) 03/17/2019    PAST SURGICAL HISTORY: Past Surgical History:  Procedure Laterality Date   ABDOMINAL HYSTERECTOMY  08/2021   BLADDER SURGERY     COLONOSCOPY WITH PROPOFOL N/A 02/11/2020   Procedure: COLONOSCOPY WITH PROPOFOL;  Surgeon: Wyline Mood, MD;  Location: [facility] ENDOSCOPY;  Service: Gastroenterology;  Laterality: N/A;   DILATION AND EVACUATION      ALLERGIES: Allergies  Allergen  Reactions   Metformin And Related Diarrhea    Severe diarrhea    FAMILY HISTORY: Family History  Problem Relation Age of Onset   Diabetes Mother    Heart disease Mother    Stroke Mother    Mesothelioma Father    Hypertension Sister    Pulmonary embolism Sister        while in hospital; other sister- on BCPs.    Breast cancer Sister 29   Hypertension Sister    Hyperlipidemia Sister    Cancer Sister    Aneurysm Brother        brain   Stroke Brother    Other Brother        unknown medical history   Deep vein thrombosis Daughter         when pregnant     SOCIAL HISTORY:  Social History   Tobacco Use   Smoking status: Former    Current packs/day: 0.00    Average packs/day: 0.3 packs/day for 47.0 years (11.8 ttl pk-yrs)    Types: Cigarettes    Start date: 01/1975    Quit date: 01/2022    Years since quitting: 1.2   Smokeless tobacco: Never   Tobacco comments:    1 pack in 4 days; was able to quit for awhile. Patient stills has an occasional cigarettes, but  uses the Nicoderm patches prn Patient has now quit smoking  Vaping Use   Vaping status: Never Used  Substance Use Topics   Alcohol use: Not Currently    Comment: last use 11/2020   Drug use: Not Currently    Types: Marijuana, Cocaine    Comment: last use 11/14/20 (marijuana)   Social History   Social History Narrative   [place]; smoking; 1ppd/cutting down. Social. Worked for [employer] from [place].       Said she has been doing ok and does not need any assistance          - is currently on food stamps but those have been enough       .[facility] consent form signed    Are you right handed or left handed? RIGHT   Are you currently employed ?    What is your current occupation?RETIRED   Do you live at home alone?   Who lives with you? boyfriend   What type of home do you live in: 1 story or 2 story? one    Caffeine 1-2 cup in AM      Objective:  Vital Signs:  Pulse 87   Ht 5\' 4"  (1.626 m)   Wt 204  lb (92.5 kg)   SpO2 94%   BMI 35.02 kg/m   Patient would not allow BP testing due to pain.  General: No acute distress.  Patient appears well-groomed.   Head:  Normocephalic/atraumatic Neck: supple, no paraspinal tenderness, full range of motion Back: No paraspinal tenderness Heart: regular rate and rhythm Lungs: Clear to auscultation bilaterally. Vascular: No carotid bruits.  Neurological Exam: Mental status: alert and oriented, speech fluent and not dysarthric, language intact.  Cranial nerves: CN I: not tested CN II: pupils equal, round and reactive to light, visual fields intact CN III, IV, VI:  full range of motion, no nystagmus, no ptosis CN V: facial sensation intact. CN VII: upper and lower face symmetric CN VIII: hearing intact CN IX, X: uvula midline CN XI: sternocleidomastoid and trapezius muscles intact CN XII: tongue midline  Bulk & Tone: normal, no fasciculations. Motor:  muscle strength 5/5 throughout, except mild weakness in LLE - 4+ in hip flexion, 5- in knee extension, knee flexion, and dorsiflexion Deep Tendon Reflexes:  2+ throughout   Sensation:  Pinprick sensation intact. Finger to nose testing:  Without dysmetria.   Coordination: Difficulty with finger tapping in LUE. Gait:  Walks with a cane. Normal station. Slow, cautious steps. No clear freezing.  Labs and Imaging review: New results: External labs from 02/01/23: Vit D wnl CMP significant for K of 3.2 CBC unremarkable HbA1c 6.8 Hep C non-reactive TSH wnl Lipid panel: Total cholesterol 121, LDL 62, TG 93  Previously reviewed results: Lab Results  Component Value Date    HGBA1C 6.6 (H) 10/18/2022      Recent Labs           Lab Results  Component Value Date    VITAMINB12  730 10/18/2022      Recent Labs           Lab Results  Component Value Date    TSH 1.840 04/17/2018      Recent Labs[] Expand by Default  No results found for: "ESRSEDRATE", "POCTSEDRATE"      Vit D (10/18/22):  6.1   Component     Latest Ref Rng 10/18/2022  Cholesterol, Total     100 - 199 mg/dL 401   Triglycerides     0 - 149 mg/dL 027   HDL Cholesterol     >39 mg/dL 40   VLDL Cholesterol Cal     5 - 40 mg/dL 21   LDL Chol Calc (NIH)     0 - 99 mg/dL 66   Total CHOL/HDL Ratio     0.0 - 4.4 ratio 3.2     Hypercoag work up for DVT - 04/09/2019: Component     Latest Ref Rng 04/09/2019  Beta-2 Glycoprotein I Ab, IgG     0 - 20 GPI IgG units <9   Beta-2-Glycoprotein I IgM     0 - 32 GPI IgM units <9   Beta-2-Glycoprotein I IgA     0 - 25 GPI IgA units <9   Anticardiolipin Ab,IgG,Qn     0 - 14 GPL U/mL 9   Anticardiolipin Ab,IgM,Qn     0 - 12 MPL U/mL <9   Anticardiolipin Ab,IgA,Qn     0 - 11 APL U/mL <9   Fibrin derivatives D-dimer ([facility])     0.00 - 499.00 ng/mL (FEU) 716.54 (H)   Protein S, Total     60 - 150 % 98   Protein C, Total     60 - 150 % 119   Antithrombin Activity     75 - 120 % 114   Recommendations-F5LEID: Comment   Recommendations-PTGENE: Comment       External labs: Labs:  LDL 100.1  Hemoglobin A1c 6.6 (03/11/19)  CTA Neck (04/29/19): 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 0.5 cm right upper lobe groundglass nodule. Attention on follow-up.    Imaging: CTH wo contrast (external, 04/29/2019): FINDINGS:  There is no midline shift. Right frontal hypodense region, involving anterior superior and middle frontal gyri, is concerning for acute infarct. No acute intracranial hemorrhage. No fractures are evident. The sinuses are pneumatized.   IMPRESSION:  Right frontal lobe acute infarct, ACA territory.    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 No evidence of dissection or aneurysm in the carotid or vertebral arteries.   The soft tissues are unremarkable without lymphadenopathy, mass or abscess. The visualized osseous  structures are unremarkable. The airway is patent and midline. 0.5 cm groundglass nodule within the right upper lobe (6:149).   In this report, if stenosis of the internal carotid artery is reported, it is calculated based on the NASCET method: (1 - (minimal residual diameter/normal diameter of distal ICA)).   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   -0.5 cm right upper lobe groundglass nodule. Attention on follow-up.  Assessment/Plan:  [name], a 67 y.o. female with left leg weakness after right ACA territory in 2020. She has some increased tone and abnormal rapid alternating movements on the left that could be due to stroke. I also  considered parkinsonism, but there is no clear signs of this on exam today and symptoms are more likely secondary to stroke. I will continue to monitor though. She admits to not following through well with PT and exercises, so I explained she may not get much more improvement in function. To avoid further weakness and deconditioning, we discussed importance of staying active.   Plan: -Continue aspirin 81 mg daily -Continue atorvastatin 40 mg daily -Continue vit D supplementation -Recommended patient stay active and doing home exercises  Return to clinic in 6 months  Total time spent reviewing records, interview, history/exam, documentation, and coordination of care on day of encounter:  30 min  Jacquelyne Balint, MD

## 2023-05-16 ENCOUNTER — Other Ambulatory Visit: Payer: Self-pay

## 2023-05-16 ENCOUNTER — Encounter: Payer: Self-pay | Admitting: Neurology

## 2023-05-16 ENCOUNTER — Ambulatory Visit (INDEPENDENT_AMBULATORY_CARE_PROVIDER_SITE_OTHER): Payer: Medicaid Other | Admitting: Neurology

## 2023-05-16 VITALS — HR 87 | Ht 64.0 in | Wt 204.0 lb

## 2023-05-16 DIAGNOSIS — R29898 Other symptoms and signs involving the musculoskeletal system: Secondary | ICD-10-CM

## 2023-05-16 DIAGNOSIS — R202 Paresthesia of skin: Secondary | ICD-10-CM

## 2023-05-16 DIAGNOSIS — Z8673 Personal history of transient ischemic attack (TIA), and cerebral infarction without residual deficits: Secondary | ICD-10-CM | POA: Diagnosis not present

## 2023-05-16 DIAGNOSIS — R2689 Other abnormalities of gait and mobility: Secondary | ICD-10-CM | POA: Diagnosis not present

## 2023-05-16 DIAGNOSIS — R269 Unspecified abnormalities of gait and mobility: Secondary | ICD-10-CM | POA: Diagnosis not present

## 2023-05-16 NOTE — Patient Instructions (Addendum)
-  Continue aspirin 81 mg daily -Continue atorvastatin 40 mg daily -Continue vit D supplementation  Please stay active and do home exercises to keep improving your strength and mobility.  - Recommend the following measures that may provide some symptomatic benefit for muscle twitching and/or cramps: - Adequate oral clear fluid intake to maintain optimal hydration (about 2.5 liters, or around 8-10 glasses per day) Avoidance of caffeine Trial of DIET tonic water: About 1 glass, up to 6 times daily Magnesium oxide up to 400 mg by mouth twice daily, as needed (over the counter) Gentle muscle stretching routine, especially before bedtime

## 2023-05-27 DIAGNOSIS — N3941 Urge incontinence: Secondary | ICD-10-CM | POA: Diagnosis not present

## 2023-06-11 DIAGNOSIS — E785 Hyperlipidemia, unspecified: Secondary | ICD-10-CM | POA: Diagnosis not present

## 2023-06-11 DIAGNOSIS — E876 Hypokalemia: Secondary | ICD-10-CM | POA: Diagnosis not present

## 2023-06-11 DIAGNOSIS — Z8673 Personal history of transient ischemic attack (TIA), and cerebral infarction without residual deficits: Secondary | ICD-10-CM | POA: Diagnosis not present

## 2023-06-11 DIAGNOSIS — E1169 Type 2 diabetes mellitus with other specified complication: Secondary | ICD-10-CM | POA: Diagnosis not present

## 2023-06-11 DIAGNOSIS — G4700 Insomnia, unspecified: Secondary | ICD-10-CM | POA: Diagnosis not present

## 2023-06-11 DIAGNOSIS — Z23 Encounter for immunization: Secondary | ICD-10-CM | POA: Diagnosis not present

## 2023-06-11 DIAGNOSIS — F419 Anxiety disorder, unspecified: Secondary | ICD-10-CM | POA: Diagnosis not present

## 2023-09-11 DIAGNOSIS — Z8673 Personal history of transient ischemic attack (TIA), and cerebral infarction without residual deficits: Secondary | ICD-10-CM | POA: Diagnosis not present

## 2023-09-11 DIAGNOSIS — E1169 Type 2 diabetes mellitus with other specified complication: Secondary | ICD-10-CM | POA: Diagnosis not present

## 2023-09-11 DIAGNOSIS — E785 Hyperlipidemia, unspecified: Secondary | ICD-10-CM | POA: Diagnosis not present

## 2023-09-11 DIAGNOSIS — N3281 Overactive bladder: Secondary | ICD-10-CM | POA: Diagnosis not present

## 2023-09-11 DIAGNOSIS — G4700 Insomnia, unspecified: Secondary | ICD-10-CM | POA: Diagnosis not present

## 2023-09-11 DIAGNOSIS — F419 Anxiety disorder, unspecified: Secondary | ICD-10-CM | POA: Diagnosis not present

## 2023-11-12 NOTE — Progress Notes (Deleted)
 NEUROLOGY FOLLOW UP OFFICE NOTE  Natalie Frey [id number]  Subjective:  Natalie Frey is a 68 y.o. year old right handed female with a history of right ACA ischemic stroke (~2020), DM2, HLD, DVT, vit D deficiency, former smoker who we last saw on 05/16/23 for ***.  To briefly review: Initial consultation (11/15/22): Patient had acute onset left sided weakness in 2020. She was eventually diagnosed with a right ACA ischemic stroke. She had virtual PT once due to COVID. She was doing okay until her hysterectomy (08/2021). After this surgery, she became much less physically active. She had a near fall in 08/2022 where she lost her balance. She started walking with a cane about 3-4 months ago.   She was having back pain and achiness about 1 year ago, so she was prescribed gabapentin. She was on gabapentin 100 mg qhs, which did not help, so she stopped it. She feels like her left lower leg is heavy. She denies tingling, burning. She has some low back pain with some radiation into left leg.   Patient previously saw neurology at [facility] (07/22/2019, Dr. Nelva Bush) after her stroke. Per clinic note from 07/22/2019: Natalie Frey is a 68 y.o. right handed female with a history of T2DM, tobacco use being evaluated in consultation at the [facility] for follow up of recent R ACA ischemic stroke.   The patient's symptoms started on 04/26/19. She was at work that day and was bending over to pick up things on the floor when she fell over.  Since then, she has been noticing the weakness and numbness in the left upper and lower extremity, more significantly in left lower extremity.  She went to outside hospital on 04/27/19 but eventually left after waiting for 3 hours without being seen.  Her symptoms failed to improve and worsened and so she presented to [facility] for evaluation.  CT head revealed right frontal lobe infarct in the ACA territory. CTA neck reveals complete occlusion of the  right ICA at the level of the carotid bifurcation.   NIHSS on admission was 2. PT and OT were consulted. PT recommended 5x high but patient wanted to go home with home health. ASA 81mg  and atorvastatin 80 mg were started. She was discharged home with a walker and bedside commode as well as home health and tobacco cessation resources and outpatient referral.    01/09/23: Patient is doing PT. PT mentions widespread pain in their notes. Patient feels like this may be helping.   She mentions that she can sometimes feel like she is going to fall out of bed even when she is not near the edge.   Patient and her significant other feels like she is doing less and less and issues may be psychological.   Patient does endorse slow movement and getting stuck at times. She denies significant tremors or acting out dreams.  05/16/23: Patient thinks PT did not help much but this is because she was not able to schedule the appointments. She has not been doing much at home and not doing the exercises. Per patient, she is lazy and in the building. 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 She does not trust herself to move. She can still freeze, but if her significant other reminds her to step by saying "right, left, right, left" she has no issues.   She has not had any falls while walking. She has slid out of a chair  to the ground when she has on socks and slides on the floor about 3 times since last visit.  Most recent Assessment and Plan (05/16/23): [name], a 68 y.o. female with left leg weakness after right ACA territory in 2020. She has some increased tone and abnormal rapid alternating movements on the left that could be due to stroke. I also considered parkinsonism, but there is no clear signs of this on exam today and symptoms are more likely secondary to stroke. I will continue to monitor though. She admits to not following through well with PT and exercises, so I explained she may not get  much more improvement in function. To avoid further weakness and deconditioning, we discussed importance of staying active.    Plan: -Continue aspirin 81 mg daily -Continue atorvastatin 40 mg daily -Continue vit D supplementation -Recommended patient stay active and doing home exercises  Since their last visit: ***  Current secondary stroke prevention medications: -Asa 81 mg daily -Lipitor 40 mg daily  Falls? Freezing?***  MEDICATIONS:  Outpatient Encounter Medications as of 11/20/2023  Medication Sig Note   acetaminophen (TYLENOL) 325 MG tablet Take 2 tablets (650 mg total) by mouth every 6 (six) hours as needed for mild pain (or Fever >/= 101). 07/13/2020: Patient takes 2-3 tablets a week as needed   aspirin EC 81 MG tablet Take 1 tablet (81 mg total) by mouth once daily.    atorvastatin (LIPITOR) 40 MG tablet TAKE TWO TABLETS BY MOUTH ONCE EVERY DAY AT 6:00PM (Patient taking differently: Take 40 mg by mouth daily.)    glipiZIDE 2.5 MG TABS Take 2.5 mg by mouth daily with supper. 10/25/2022: Patient takes if blood sugar is >130   mirabegron ER (MYRBETRIQ) 50 MG TB24 tablet Take 1 tablet (50 mg total) by mouth once daily. 10/18/2022: Patient takes 2-3 times a week   mirtazapine (REMERON) 15 MG tablet Take 1.5 tablets (22.5 mg total) by mouth at bedtime.    Vitamin D, Ergocalciferol, (DRISDOL) 1.25 MG (50000 UNIT) CAPS capsule Take 1 capsule (50,000 Units total) by mouth every 7 (seven) days.    No facility-administered encounter medications on file as of 11/20/2023.    PAST MEDICAL HISTORY: Past Medical History:  Diagnosis Date   Cancer (HCC)    bladder (10 years ago, per patient)   Dvt femoral (deep venous thrombosis) (HCC)    Hyperlipidemia    Paresthesia    Prediabetes    Stroke [facility])    July 2020, per patient   Type 2 diabetes mellitus without complications (HCC) 03/17/2019    PAST SURGICAL HISTORY: Past Surgical History:  Procedure Laterality Date   ABDOMINAL  HYSTERECTOMY  08/2021   BLADDER SURGERY     COLONOSCOPY WITH PROPOFOL N/A 02/11/2020   Procedure: COLONOSCOPY WITH PROPOFOL;  Surgeon: Wyline Mood, MD;  Location: [facility] ENDOSCOPY;  Service: Gastroenterology;  Laterality: N/A;   DILATION AND EVACUATION      ALLERGIES: Allergies  Allergen Reactions   Metformin And Related Diarrhea    Severe diarrhea    FAMILY HISTORY: Family History  Problem Relation Age of Onset   Diabetes Mother    Heart disease Mother    Stroke Mother    Mesothelioma Father    Hypertension Sister    Pulmonary embolism Sister        while in hospital; other sister- on BCPs.    Breast cancer Sister 60   Hypertension Sister    Hyperlipidemia Sister    Cancer  Sister    Aneurysm Brother        brain   Stroke Brother    Other Brother        unknown medical history   Deep vein thrombosis Daughter         when pregnant     SOCIAL HISTORY: Social History   Tobacco Use   Smoking status: Former    Current packs/day: 0.00    Average packs/day: 0.3 packs/day for 47.0 years (11.8 ttl pk-yrs)    Types: Cigarettes    Start date: 01/1975    Quit date: 01/2022    Years since quitting: 1.7   Smokeless tobacco: Never   Tobacco comments:    1 pack in 4 days; was able to quit for awhile. Patient stills has an occasional cigarettes, but  uses the Nicoderm patches prn Patient has now quit smoking  Vaping Use   Vaping status: Never Used  Substance Use Topics   Alcohol use: Not Currently    Comment: last use 11/2020   Drug use: Not Currently    Types: Marijuana, Cocaine    Comment: last use 11/14/20 (marijuana)   Social History   Social History Narrative   [place]; smoking; 1ppd/cutting down. Social. Worked for [employer] from [place].       Said she has been doing ok and does not need any assistance          - is currently on food stamps but those have been enough       .[facility] consent form signed    Are you right handed or left handed? RIGHT   Are  you currently employed ?    What is your current occupation?RETIRED   Do you live at home alone?   Who lives with you? boyfriend   What type of home do you live in: 1 story or 2 story? one    Caffeine 1-2 cup in AM      Objective:  Vital Signs:  There were no vitals taken for this visit.  ***  Labs and Imaging review: New results: HbA1c (external - 09/11/23): 6.0 ***  Previously reviewed results: External labs from 02/01/23: Vit D wnl CMP significant for K of 3.2 CBC unremarkable HbA1c 6.8 Hep C non-reactive TSH wnl Lipid panel: Total cholesterol 121, LDL 62, TG 93        Lab Results  Component Value Date    HGBA1C 6.6 (H) 10/18/2022      Recent Labs           Lab Results  Component Value Date    VITAMINB12 730 10/18/2022      Recent Labs           Lab Results  Component Value Date    TSH 1.840 04/17/2018      Recent Labs[] Expand by Default  No results found for: "ESRSEDRATE", "POCTSEDRATE"      Vit D (10/18/22): 6.1   Component     Latest Ref Rng 10/18/2022  Cholesterol, Total     100 - 199 mg/dL 161   Triglycerides     0 - 149 mg/dL 096   HDL Cholesterol     >39 mg/dL 40   VLDL Cholesterol Cal     5 - 40 mg/dL 21   LDL Chol Calc (NIH)     0 - 99 mg/dL 66   Total CHOL/HDL Ratio     0.0 - 4.4 ratio 3.2     Hypercoag work up  for DVT - 04/09/2019: Component     Latest Ref Rng 04/09/2019  Beta-2 Glycoprotein I Ab, IgG     0 - 20 GPI IgG units <9   Beta-2-Glycoprotein I IgM     0 - 32 GPI IgM units <9   Beta-2-Glycoprotein I IgA     0 - 25 GPI IgA units <9   Anticardiolipin Ab,IgG,Qn     0 - 14 GPL U/mL 9   Anticardiolipin Ab,IgM,Qn     0 - 12 MPL U/mL <9   Anticardiolipin Ab,IgA,Qn     0 - 11 APL U/mL <9   Fibrin derivatives D-dimer ([facility])     0.00 - 499.00 ng/mL (FEU) 716.54 (H)   Protein S, Total     60 - 150 % 98   Protein C, Total     60 - 150 % 119   Antithrombin Activity     75 - 120 % 114   Recommendations-F5LEID: Comment    Recommendations-PTGENE: Comment       External labs: Labs:  LDL 100.1  Hemoglobin A1c 6.6 (03/11/19)  CTA Neck (04/29/19): 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 0.5 cm right upper lobe groundglass nodule. Attention on follow-up.    CTH wo contrast (external, 04/29/2019): FINDINGS:  There is no midline shift. Right frontal hypodense region, involving anterior superior and middle frontal gyri, is concerning for acute infarct. No acute intracranial hemorrhage. No fractures are evident. The sinuses are pneumatized.   IMPRESSION:  Right frontal lobe acute infarct, ACA territory.    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 No evidence of dissection or aneurysm in the carotid or vertebral arteries.   The soft tissues are unremarkable without lymphadenopathy, mass or abscess. The visualized osseous structures are unremarkable. The airway is patent and midline. 0.5 cm groundglass nodule within the right upper lobe (6:149).   In this report, if stenosis of the internal carotid artery is reported, it is calculated based on the NASCET method: (1 - (minimal residual diameter/normal diameter of distal ICA)).   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   -0.5 cm right upper lobe groundglass nodule. Attention on follow-up.  Assessment/Plan:  [name], a 68 y.o. female with: ***   Plan: ***  Return to clinic in ***  Total time spent reviewing records, interview, history/exam, documentation, and coordination of care on day of encounter:  *** min  Jacquelyne Balint, MD

## 2023-11-20 ENCOUNTER — Ambulatory Visit: Payer: Self-pay | Admitting: Neurology

## 2024-03-09 NOTE — Progress Notes (Signed)
 NEUROLOGY FOLLOW UP OFFICE NOTE  Natalie Frey [id number]  Subjective:  Natalie Frey is a 68 y.o. year old right handed female with a history of right ACA ischemic stroke (~2020), DM2, HLD, DVT, vit D deficiency, former smoker who we last saw on 05/16/23 for left leg weakness/stiffness.  To briefly review: Initial consultation (11/15/22): Patient had acute onset left sided weakness in 2020. She was eventually diagnosed with a right ACA ischemic stroke. She had virtual PT once due to COVID. She was doing okay until her hysterectomy (08/2021). After this surgery, she became much less physically active. She had a near fall in 08/2022 where she lost her balance. She started walking with a cane about 3-4 months ago.   She was having back pain and achiness about 1 year ago, so she was prescribed gabapentin . She was on gabapentin  100 mg qhs, which did not help, so she stopped it. She feels like her left lower leg is heavy. She denies tingling, burning. She has some low back pain with some radiation into left leg.   Patient previously saw neurology at [facility] (07/22/2019, Dr. Virgil Frey) after her stroke. Per clinic note from 07/22/2019: [name] is a 68 y.o. right handed female with a history of T2DM, tobacco use being evaluated in consultation at the [facility] for follow up of recent R ACA ischemic stroke.   The patient's symptoms started on 04/26/19. She was at work that day and was bending over to pick up things on the floor when she fell over.  Since then, she has been noticing the weakness and numbness in the left upper and lower extremity, more significantly in left lower extremity.  She went to outside hospital on 04/27/19 but eventually left after waiting for 3 hours without being seen.  Her symptoms failed to improve and worsened and so she presented to [facility] for evaluation.  CT head revealed right frontal lobe infarct in the ACA territory. 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   NIHSS on admission was 2. PT and OT were consulted. PT recommended 5x high but patient wanted to go home with home health. ASA 81mg  and atorvastatin  80 mg were started. She was discharged home with a walker and bedside commode as well as home health and tobacco cessation resources and outpatient referral.    01/09/23: Patient is doing PT. PT mentions widespread pain in their notes. Patient feels like this may be helping.   She mentions that she can sometimes feel like she is going to fall out of bed even when she is not near the edge.   Patient and her significant other feels like she is doing less and less and issues may be psychological.   Patient does endorse slow movement and getting stuck at times. She denies significant tremors or acting out dreams.   05/16/23: Patient thinks PT did not help much but this is because she was not able to schedule the appointments. She has not been doing much at home and not doing the exercises. Per patient, she is lazy and in the building. 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 She does not trust herself to move. She can still freeze, but if her significant other reminds her to step by saying right, left, right, left she has no issues.   She has not had any falls while walking. She has slid out  of a chair to the ground when she has on socks and slides on the floor about 3 times since last visit.  Most recent Assessment and Plan (05/16/23): [name], a 68 y.o. female with left leg weakness after right ACA territory in 2020. She has some increased tone and abnormal rapid alternating movements on the left that could be due to stroke. I also considered parkinsonism, but there is no clear signs of this on exam today and symptoms are more likely secondary to stroke. I will continue to monitor though. She admits to not following through well with PT and exercises, so I  explained she may not get much more improvement in function. To avoid further weakness and deconditioning, we discussed importance of staying active.    Plan: -Continue aspirin  81 mg daily -Continue atorvastatin  40 mg daily -Continue vit D supplementation -Recommended patient stay active and doing home exercises  Since their last visit: Patient still has bad days walking. Some days it is better. She is still not very active. Her left leg is still weak. She had one fall in her yard which patient states was her fault for being out in a bad part of the yard and chasing the dogs. She denies any freezing.  Current secondary stroke prevention medications: -Asa 81 mg daily -Lipitor 40 mg daily   She denies new symptoms. She denies pain.  She has started smoking again, about 1 pack per day.   MEDICATIONS:  Outpatient Encounter Medications as of 03/18/2024  Medication Sig Note   acetaminophen  (TYLENOL ) 325 MG tablet Take 2 tablets (650 mg total) by mouth every 6 (six) hours as needed for mild pain (or Fever >/= 101). 07/13/2020: Patient takes 2-3 tablets a week as needed   aspirin  EC 81 MG tablet Take 1 tablet (81 mg total) by mouth once daily.    atorvastatin  (LIPITOR) 40 MG tablet TAKE TWO TABLETS BY MOUTH ONCE EVERY DAY AT 6:00PM    glipiZIDE  2.5 MG TABS Take 2.5 mg by mouth daily with supper. 10/25/2022: Patient takes if blood sugar is >130   mirabegron  ER (MYRBETRIQ ) 50 MG TB24 tablet Take 1 tablet (50 mg total) by mouth once daily. 10/18/2022: Patient takes 2-3 times a week   mirtazapine  (REMERON ) 15 MG tablet Take 1.5 tablets (22.5 mg total) by mouth at bedtime.    Vitamin D , Ergocalciferol , (DRISDOL ) 1.25 MG (50000 UNIT) CAPS capsule Take 1 capsule (50,000 Units total) by mouth every 7 (seven) days.    No facility-administered encounter medications on file as of 03/18/2024.    PAST MEDICAL HISTORY: Past Medical History:  Diagnosis Date   Cancer (HCC)    bladder (10 years ago, per  patient)   Dvt femoral (deep venous thrombosis) (HCC)    Hyperlipidemia    Paresthesia    Prediabetes    Stroke [facility])    July 2020, per patient   Type 2 diabetes mellitus without complications (HCC) 03/17/2019    PAST SURGICAL HISTORY: Past Surgical History:  Procedure Laterality Date   ABDOMINAL HYSTERECTOMY  08/2021   BLADDER SURGERY     COLONOSCOPY WITH PROPOFOL  N/A 02/11/2020   Procedure: COLONOSCOPY WITH PROPOFOL ;  Surgeon: Luke Salaam, MD;  Location[facility] END[facility]COPY;  Service: Gastroenterology;  Laterality: N/A;   DILATION AND EVACUATION      ALLERGIES: Allergies  Allergen Reactions   Metformin  And Related Diarrhea    Severe diarrhea    FAMILY HISTORY: Family History  Problem Relation Age of Onset  Diabetes Mother    Heart disease Mother    Stroke Mother    Mesothelioma Father    Hypertension Sister    Pulmonary embolism Sister        while in hospital; other sister- on BCPs.    Breast cancer Sister 14   Hypertension Sister    Hyperlipidemia Sister    Cancer Sister    Aneurysm Brother        brain   Stroke Brother    Other Brother        unknown medical history   Deep vein thrombosis Daughter         when pregnant     SOCIAL HISTORY: Social History   Tobacco Use   Smoking status: Former    Current packs/day: 0.00    Average packs/day: 0.3 packs/day for 47.0 years (11.8 ttl pk-yrs)    Types: Cigarettes    Start date: 01/1975    Quit date: 01/2022    Years since quitting: 2.1   Smokeless tobacco: Never   Tobacco comments:    1 pack in 4 days; was able to quit for awhile. Patient stills has an occasional cigarettes, but  uses the Nicoderm patches prn Patient has now quit smoking  Vaping Use   Vaping status: Never Used  Substance Use Topics   Alcohol use: Not Currently    Comment: last use 11/2020   Drug use: Not Currently    Types: Marijuana, Cocaine    Comment: last use 11/14/20 (marijuana)   Social History   Social History Narrative   [place] smoking; 1ppd/cutting down. Social. Worked for [employer] from [place] .       Arnetta Lank she has been doing ok and does not need any assistance          - is currently on food stamps but those have been enough       .[facility] consent form signed    Are you right handed or left handed? RIGHT   Are you currently employed ?    What is your current occupation?RETIRED   Do you live at home alone?   Who lives with you? boyfriend   What type of home do you live in: 1 story or 2 story? one    Caffeine 1-2 cup in AM      Objective:  Vital Signs:  BP 120/66   Pulse 86   Ht 5' 4 (1.626 m)   Wt 191 lb (86.6 kg)   SpO2 96%   BMI 32.79 kg/m   General: No acute distress.  Patient appears well-groomed.   Head:  Normocephalic/atraumatic Neck: supple Heart: regular rate and rhythm Lungs: Clear to auscultation bilaterally. Vascular: No carotid bruits.  Neurological Exam: Mental status: alert and oriented, speech fluent and not dysarthric, language intact.  Cranial nerves: CN I: not tested CN II: pupils equal, round and reactive to light, visual fields intact CN III, IV, VI:  full range of motion, no nystagmus, no ptosis CN V: facial sensation intact. CN VII: upper and lower face symmetric CN VIII: hearing intact CN IX, X: uvula midline CN XI: sternocleidomastoid and trapezius muscles intact CN XII: tongue midline  Bulk & Tone: increased in LLE Motor:  muscle strength 5/5 throughout Deep Tendon Reflexes:  2+ throughout.   Sensation:  Pinprick sensation intact. Finger to nose testing:  Without dysmetria.    Gait:  Normal station and stride. Walks with cane.   Labs and Imaging review: New results: HbA1c (external -  01/21/24): 6.0   Previously reviewed results: External labs from 02/01/23: Vit D wnl CMP significant for K of 3.2 CBC unremarkable HbA1c 6.8 Hep C non-reactive TSH wnl Lipid panel: Total cholesterol 121, LDL 62, TG 93            Lab Results  Component  Value Date    HGBA1C 6.6 (H) 10/18/2022      Recent Labs           Lab Results  Component Value Date    VITAMINB12 730 10/18/2022      Recent Labs           Lab Results  Component Value Date    TSH 1.840 04/17/2018      Recent Labs[] Expand by Default  No results found for: ESRSEDRATE, POCTSEDRATE      Vit D (10/18/22): 6.1   Component     Latest Ref Rng 10/18/2022  Cholesterol, Total     100 - 199 mg/dL 161   Triglycerides     0 - 149 mg/dL 096   HDL Cholesterol     >39 mg/dL 40   VLDL Cholesterol Cal     5 - 40 mg/dL 21   LDL Chol Calc (NIH)     0 - 99 mg/dL 66   Total CHOL/HDL Ratio     0.0 - 4.4 ratio 3.2     Hypercoag work up for DVT - 04/09/2019: Component     Latest Ref Rng 04/09/2019  Beta-2  Glycoprotein I Ab, IgG     0 - 20 GPI IgG units <9   Beta-2 -Glycoprotein I IgM     0 - 32 GPI IgM units <9   Beta-2 -Glycoprotein I IgA     0 - 25 GPI IgA units <9   Anticardiolipin Ab,IgG,Qn     0 - 14 GPL U/mL 9   Anticardiolipin Ab,IgM,Qn     0 - 12 MPL U/mL <9   Anticardiolipin Ab,IgA,Qn     0 - 11 APL U/mL <9   Fibrin derivatives D-dimer ([facility])     0.00 - 499.00 ng/mL (FEU) 716.54 (H)   Protein S, Total     60 - 150 % 98   Protein C, Total     60 - 150 % 119   Antithrombin Activity     75 - 120 % 114   Recommendations-F5LEID: Comment   Recommendations-PTGENE: Comment       External labs: Labs:  LDL 100.1  Hemoglobin A1c 6.6 (03/11/19)  CTA Neck (04/29/19): 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 0.5 cm right upper lobe groundglass nodule. Attention on follow-up.    CTH wo contrast (external, 04/29/2019): FINDINGS:  There is no midline shift. Right frontal hypodense region, involving anterior superior and middle frontal gyri, is concerning for acute infarct. No acute intracranial hemorrhage. No fractures are evident. The sinuses are pneumatized.   IMPRESSION:  Right frontal lobe acute infarct, ACA territory.     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 No evidence of dissection or aneurysm in the carotid or vertebral arteries.   The soft tissues are unremarkable without lymphadenopathy, mass or abscess. The visualized osseous structures are unremarkable. The airway is patent and midline. 0.5 cm groundglass nodule within the right upper lobe (6:149).   In this report, if stenosis of the internal carotid artery is reported, it  is calculated based on the NASCET method: (1 - (minimal residual diameter/normal diameter of distal ICA)).   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   -0.5 cm right upper lobe groundglass nodule. Attention on follow-up.   Assessment/Plan:  [name], a 68 y.o. female with left leg weakness after right ACA territory in 2020. There was some consideration of parkinsonism due to slowness of movements, but symptoms are better explained by prior stroke and very stable. Patient is inactive, which likely contributes to post stroke stiffness in the left leg. She also recently restarted smoking, which adds to her stroke risk.   Plan: -Continue aspirin  81 mg daily -Continue lipitor 40 mg daily -Stroke warning signs discussed -Discussed importance of staying active -Discussed importance of tobacco cessation  Return to clinic as needed  Total time spent reviewing records, interview, history/exam, documentation, and coordination of care on day of encounter:  35 min  Rommie Coats, MD

## 2024-03-18 ENCOUNTER — Encounter: Payer: Self-pay | Admitting: Neurology

## 2024-03-18 ENCOUNTER — Ambulatory Visit (INDEPENDENT_AMBULATORY_CARE_PROVIDER_SITE_OTHER): Payer: Medicare Other | Admitting: Neurology

## 2024-03-18 VITALS — BP 120/66 | HR 86 | Ht 64.0 in | Wt 191.0 lb

## 2024-03-18 DIAGNOSIS — R202 Paresthesia of skin: Secondary | ICD-10-CM

## 2024-03-18 DIAGNOSIS — R2689 Other abnormalities of gait and mobility: Secondary | ICD-10-CM

## 2024-03-18 DIAGNOSIS — Z8673 Personal history of transient ischemic attack (TIA), and cerebral infarction without residual deficits: Secondary | ICD-10-CM

## 2024-03-18 DIAGNOSIS — R269 Unspecified abnormalities of gait and mobility: Secondary | ICD-10-CM | POA: Diagnosis not present

## 2024-03-18 DIAGNOSIS — R29898 Other symptoms and signs involving the musculoskeletal system: Secondary | ICD-10-CM | POA: Diagnosis not present

## 2024-03-18 NOTE — Patient Instructions (Addendum)
 -Continue aspirin  81 mg daily -Continue lipitor 40 mg daily  -Discussed importance of staying active -Discussed importance of stopping smoking  If you have new difficulty speaking, face droop, numbness on one side of the body, weakness on one side of the body, or dizziness/imbalance, this could be the sign of a stroke. Don't wait, please call EMS and be evaluated at the nearest emergency room.  The physicians and staff at [facility] are committed to providing excellent care. You may receive a survey requesting feedback about your experience at our office. We strive to receive very good responses to the survey questions. If you feel that your experience would prevent you from giving the office a very good  response, please contact our office to try to remedy the situation. We may be reached at [phone]. Thank you for taking the time out of your busy day to complete the survey.  Rommie Coats, MD [facility]  Preventing Falls at [facility] are common, often dreaded events in the lives of older people. Aside from the obvious injuries and even death that may result, fall can cause wide-ranging consequences including loss of independence, mental decline, decreased activity and mobility. Younger people are also at risk of falling, especially those with chronic illnesses and fatigue.  Ways to reduce risk for falling Examine diet and medications. Warm foods and alcohol dilate blood vessels, which can lead to dizziness when standing. Sleep aids, antidepressants and pain medications can also increase the likelihood of a fall.  Get a vision exam. Poor vision, cataracts and glaucoma increase the chances of falling.  Check foot gear. Shoes should fit snugly and have a sturdy, nonskid sole and a broad, low heel  Participate in a physician-approved exercise program to build and maintain muscle strength and improve balance and coordination. Programs that use ankle weights or stretch bands  are excellent for muscle-strengthening. Water aerobics programs and low-impact Tai Chi programs have also been shown to improve balance and coordination.  Increase vitamin D  intake. Vitamin D  improves muscle strength and increases the amount of calcium  the body is able to absorb and deposit in bones.  How to prevent falls from common hazards Floors - Remove all loose wires, cords, and throw rugs. Minimize clutter. Make sure rugs are anchored and smooth. Keep furniture in its usual place.  Chairs -- Use chairs with straight backs, armrests and firm seats. Add firm cushions to existing pieces to add height.  Bathroom - Install grab bars and non-skid tape in the tub or shower. Use a bathtub transfer bench or a shower chair with a back support Use an elevated toilet seat and/or safety rails to assist standing from a low surface. Do not use towel racks or bathroom tissue holders to help you stand.  Lighting - Make sure halls, stairways, and entrances are well-lit. Install a night light in your bathroom or hallway. Make sure there is a light switch at the top and bottom of the staircase. Turn lights on if you get up in the middle of the night. Make sure lamps or light switches are within reach of the bed if you have to get up during the night.  Kitchen - Install non-skid rubber mats near the sink and stove. Clean spills immediately. Store frequently used utensils, pots, pans between waist and eye level. This helps prevent reaching and bending. Sit when getting things out of lower cupboards.  Living room/ Bedrooms - Place furniture with wide spaces in between, giving enough room to  move around. Establish a route through the living room that gives you something to hold onto as you walk.  Stairs - Make sure treads, rails, and rugs are secure. Install a rail on both sides of the stairs. If stairs are a threat, it might be helpful to arrange most of your activities on the lower level to reduce the number of  times you must climb the stairs.  Entrances and doorways - Install metal handles on the walls adjacent to the doorknobs of all doors to make it more secure as you travel through the doorway.  Tips for maintaining balance Keep at least one hand free at all times. Try using a backpack or fanny pack to hold things rather than carrying them in your hands. Never carry objects in both hands when walking as this interferes with keeping your balance.  Attempt to swing both arms from front to back while walking. This might require a conscious effort if Parkinson's disease has diminished your movement. It will, however, help you to maintain balance and posture, and reduce fatigue.  Consciously lift your feet off of the ground when walking. Shuffling and dragging of the feet is a common culprit in losing your balance.  When trying to navigate turns, use a U technique of facing forward and making a wide turn, rather than pivoting sharply.  Try to stand with your feet shoulder-length apart. When your feet are close together for any length of time, you increase your risk of losing your balance and falling.  Do one thing at a time. Don't try to walk and accomplish another task, such as reading or looking around. The decrease in your automatic reflexes complicates motor function, so the less distraction, the better.  Do not wear rubber or gripping soled shoes, they might catch on the floor and cause tripping.  Move slowly when changing positions. Use deliberate, concentrated movements and, if needed, use a grab bar or walking aid. Count 15 seconds between each movement. For example, when rising from a seated position, wait 15 seconds after standing to begin walking.  If balance is a continuous problem, you might want to consider a walking aid such as a cane, walking stick, or walker. Once you've mastered walking with help, you might be ready to try it on your own again.

## 2024-10-15 ENCOUNTER — Other Ambulatory Visit: Payer: Self-pay | Admitting: Gerontology

## 2024-10-15 DIAGNOSIS — R4589 Other symptoms and signs involving emotional state: Secondary | ICD-10-CM

## 2024-10-16 ENCOUNTER — Other Ambulatory Visit: Payer: Self-pay
